# Patient Record
Sex: Female | Born: 1980 | Race: White | Hispanic: No | Marital: Married | Smoking: Never smoker
Health system: Southern US, Community
[De-identification: ages and names within clinical notes are randomized; demographics above are authoritative.]

## PROBLEM LIST (undated history)

## (undated) ENCOUNTER — Inpatient Hospital Stay (HOSPITAL_COMMUNITY): Payer: Self-pay

## (undated) DIAGNOSIS — F419 Anxiety disorder, unspecified: Secondary | ICD-10-CM

## (undated) DIAGNOSIS — B951 Streptococcus, group B, as the cause of diseases classified elsewhere: Secondary | ICD-10-CM

## (undated) DIAGNOSIS — O26899 Other specified pregnancy related conditions, unspecified trimester: Secondary | ICD-10-CM

## (undated) DIAGNOSIS — O2340 Unspecified infection of urinary tract in pregnancy, unspecified trimester: Secondary | ICD-10-CM

## (undated) DIAGNOSIS — E282 Polycystic ovarian syndrome: Secondary | ICD-10-CM

## (undated) DIAGNOSIS — R87612 Low grade squamous intraepithelial lesion on cytologic smear of cervix (LGSIL): Secondary | ICD-10-CM

## (undated) DIAGNOSIS — R109 Unspecified abdominal pain: Secondary | ICD-10-CM

## (undated) DIAGNOSIS — Z227 Latent tuberculosis: Secondary | ICD-10-CM

## (undated) DIAGNOSIS — I499 Cardiac arrhythmia, unspecified: Secondary | ICD-10-CM

## (undated) DIAGNOSIS — F32A Depression, unspecified: Secondary | ICD-10-CM

## (undated) DIAGNOSIS — J45909 Unspecified asthma, uncomplicated: Secondary | ICD-10-CM

## (undated) DIAGNOSIS — O321XX0 Maternal care for breech presentation, not applicable or unspecified: Secondary | ICD-10-CM

## (undated) DIAGNOSIS — R110 Nausea: Secondary | ICD-10-CM

## (undated) DIAGNOSIS — M1990 Unspecified osteoarthritis, unspecified site: Secondary | ICD-10-CM

## (undated) DIAGNOSIS — K589 Irritable bowel syndrome without diarrhea: Secondary | ICD-10-CM

## (undated) DIAGNOSIS — Z8709 Personal history of other diseases of the respiratory system: Secondary | ICD-10-CM

## (undated) DIAGNOSIS — R002 Palpitations: Secondary | ICD-10-CM

## (undated) DIAGNOSIS — R7303 Prediabetes: Secondary | ICD-10-CM

## (undated) DIAGNOSIS — R0789 Other chest pain: Secondary | ICD-10-CM

## (undated) DIAGNOSIS — Z3493 Encounter for supervision of normal pregnancy, unspecified, third trimester: Secondary | ICD-10-CM

## (undated) DIAGNOSIS — R000 Tachycardia, unspecified: Secondary | ICD-10-CM

## (undated) DIAGNOSIS — J189 Pneumonia, unspecified organism: Secondary | ICD-10-CM

## (undated) DIAGNOSIS — Z8619 Personal history of other infectious and parasitic diseases: Secondary | ICD-10-CM

## (undated) DIAGNOSIS — M797 Fibromyalgia: Secondary | ICD-10-CM

## (undated) DIAGNOSIS — F329 Major depressive disorder, single episode, unspecified: Secondary | ICD-10-CM

## (undated) HISTORY — DX: Latent tuberculosis: Z22.7

## (undated) HISTORY — DX: Personal history of other infectious and parasitic diseases: Z86.19

## (undated) HISTORY — DX: Streptococcus, group b, as the cause of diseases classified elsewhere: O23.40

## (undated) HISTORY — PX: COLPOSCOPY W/ BIOPSY / CURETTAGE: SUR283

## (undated) HISTORY — DX: Personal history of other diseases of the respiratory system: Z87.09

## (undated) HISTORY — DX: Unspecified abdominal pain: R10.9

## (undated) HISTORY — DX: Fibromyalgia: M79.7

## (undated) HISTORY — DX: Palpitations: R00.2

## (undated) HISTORY — PX: OTHER SURGICAL HISTORY: SHX169

## (undated) HISTORY — DX: Unspecified osteoarthritis, unspecified site: M19.90

## (undated) HISTORY — DX: Polycystic ovarian syndrome: E28.2

## (undated) HISTORY — DX: Streptococcus, group b, as the cause of diseases classified elsewhere: B95.1

## (undated) HISTORY — DX: Nausea: R11.0

## (undated) HISTORY — PX: ABDOMINAL HYSTERECTOMY: SHX81

## (undated) HISTORY — DX: Tachycardia, unspecified: R00.0

## (undated) HISTORY — DX: Other specified pregnancy related conditions, unspecified trimester: O26.899

## (undated) HISTORY — DX: Other chest pain: R07.89

## (undated) HISTORY — PX: WISDOM TOOTH EXTRACTION: SHX21

## (undated) HISTORY — DX: Irritable bowel syndrome, unspecified: K58.9

## (undated) HISTORY — DX: Low grade squamous intraepithelial lesion on cytologic smear of cervix (LGSIL): R87.612

---
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
## 2001-03-24 HISTORY — PX: MANDIBLE FRACTURE SURGERY: SHX706

## 2001-03-24 HISTORY — PX: NASAL SEPTUM SURGERY: SHX37

## 2002-09-30 ENCOUNTER — Other Ambulatory Visit: Admission: RE | Admit: 2002-09-30 | Discharge: 2002-09-30 | Payer: Self-pay | Admitting: Obstetrics and Gynecology

## 2003-06-19 ENCOUNTER — Emergency Department (HOSPITAL_COMMUNITY): Admission: EM | Admit: 2003-06-19 | Discharge: 2003-06-20 | Payer: Self-pay | Admitting: Emergency Medicine

## 2003-06-26 ENCOUNTER — Ambulatory Visit (HOSPITAL_COMMUNITY): Admission: RE | Admit: 2003-06-26 | Discharge: 2003-06-26 | Payer: Self-pay | Admitting: *Deleted

## 2003-06-26 ENCOUNTER — Encounter: Payer: Self-pay | Admitting: Specialist

## 2003-10-17 ENCOUNTER — Ambulatory Visit (HOSPITAL_COMMUNITY): Admission: RE | Admit: 2003-10-17 | Discharge: 2003-10-17 | Payer: Self-pay | Admitting: Chiropractor

## 2004-01-16 ENCOUNTER — Other Ambulatory Visit: Admission: RE | Admit: 2004-01-16 | Discharge: 2004-01-16 | Payer: Self-pay | Admitting: Obstetrics and Gynecology

## 2004-04-18 ENCOUNTER — Encounter: Admission: RE | Admit: 2004-04-18 | Discharge: 2004-04-18 | Payer: Self-pay | Admitting: Sports Medicine

## 2004-10-09 ENCOUNTER — Emergency Department (HOSPITAL_COMMUNITY): Admission: EM | Admit: 2004-10-09 | Discharge: 2004-10-09 | Payer: Self-pay | Admitting: Emergency Medicine

## 2005-01-30 ENCOUNTER — Other Ambulatory Visit: Admission: RE | Admit: 2005-01-30 | Discharge: 2005-01-30 | Payer: Self-pay | Admitting: Obstetrics and Gynecology

## 2005-02-19 ENCOUNTER — Encounter: Admission: RE | Admit: 2005-02-19 | Discharge: 2005-02-19 | Payer: Self-pay | Admitting: Gastroenterology

## 2005-11-24 DIAGNOSIS — M797 Fibromyalgia: Secondary | ICD-10-CM

## 2005-11-24 HISTORY — DX: Fibromyalgia: M79.7

## 2006-05-11 ENCOUNTER — Other Ambulatory Visit: Admission: RE | Admit: 2006-05-11 | Discharge: 2006-05-11 | Payer: Self-pay | Admitting: Obstetrics & Gynecology

## 2006-08-14 IMAGING — CT CT ABDOMEN W/ CM
1 of 2 series · 15 of 32 positions shown, 20 images · IV contrast (GASTRO & OMNIPAQUE 100)
Comparison: None. 
 CT ABDOMEN WITH CONTRAST:

CLINICAL DATA: Bilateral lower quadrant abdominal pain.
TECHNIQUE: Routine multidetector helical imaging with oral and IV contrast ? 100 cc of Omnipaque 300.

[Series 2: routine abdomen · axial · 0.70mm/px · z∈[-443,-53]mm · 15 of 88 slices shown, 20 images]
[im 5/88  soft-tissue]
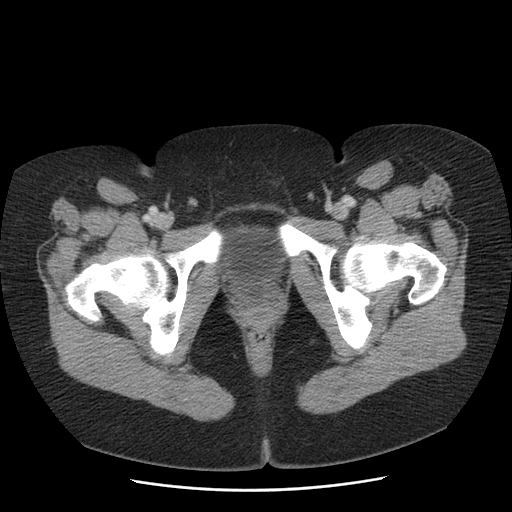
[im 5/88  bone]
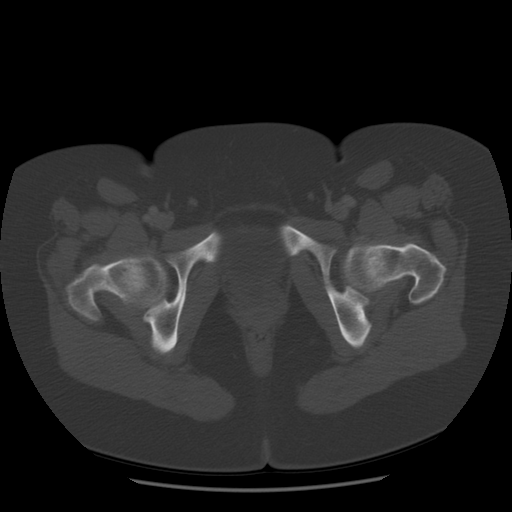
[im 14/88  soft-tissue]
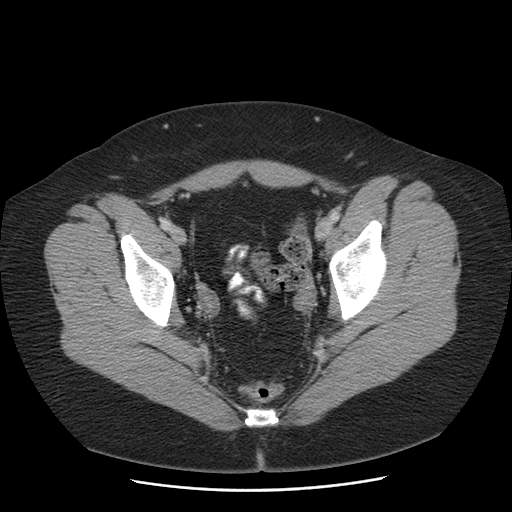
[im 18/88  soft-tissue]
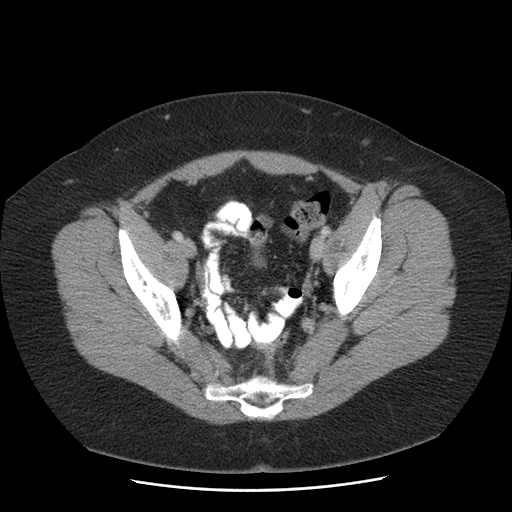
[im 22/88  soft-tissue]
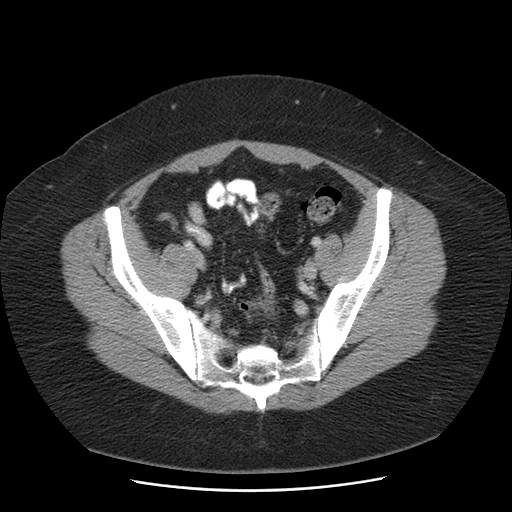
[im 31/88  soft-tissue]
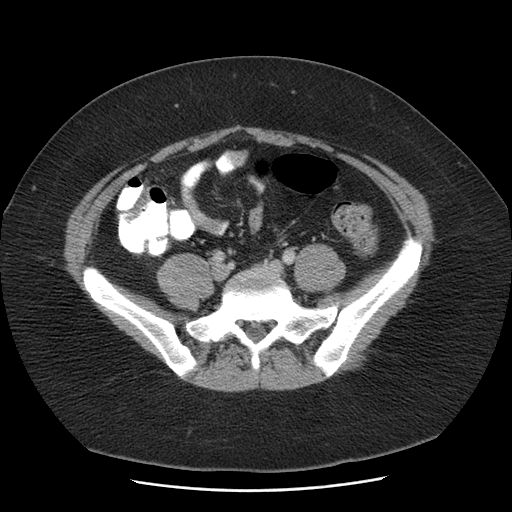
[im 35/88  soft-tissue]
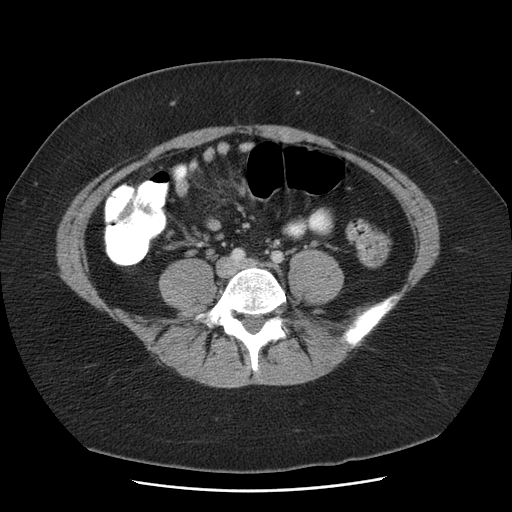
[im 40/88  soft-tissue]
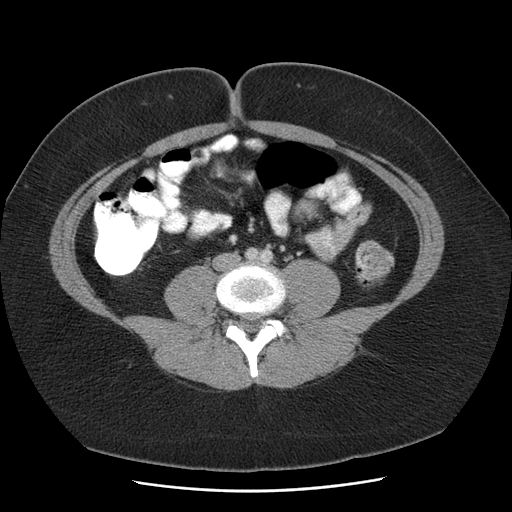
[im 48/88  soft-tissue]
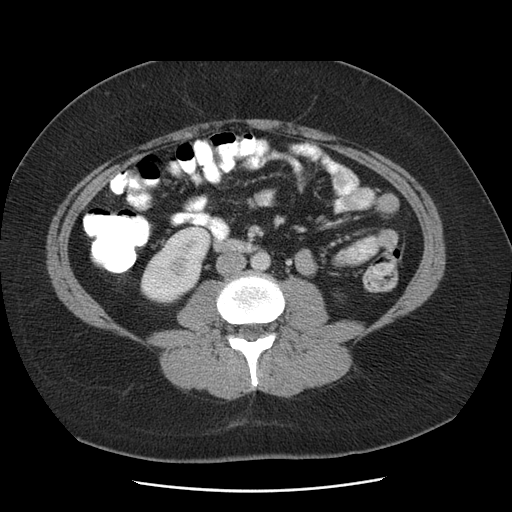
[im 53/88  soft-tissue]
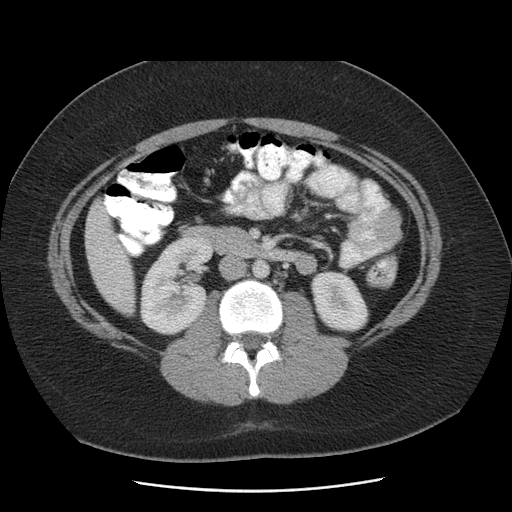
[im 53/88  bone]
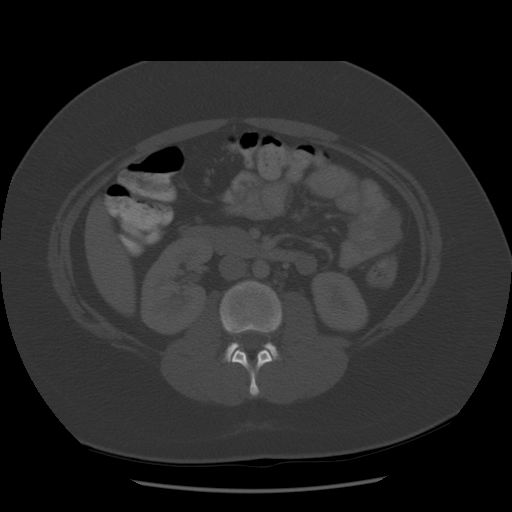
[im 57/88  soft-tissue]
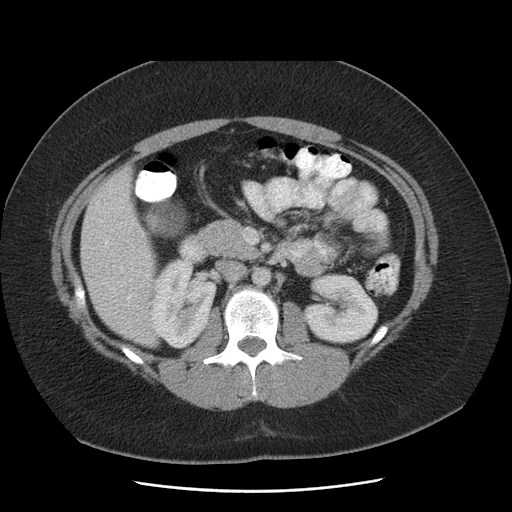
[im 66/88  soft-tissue]
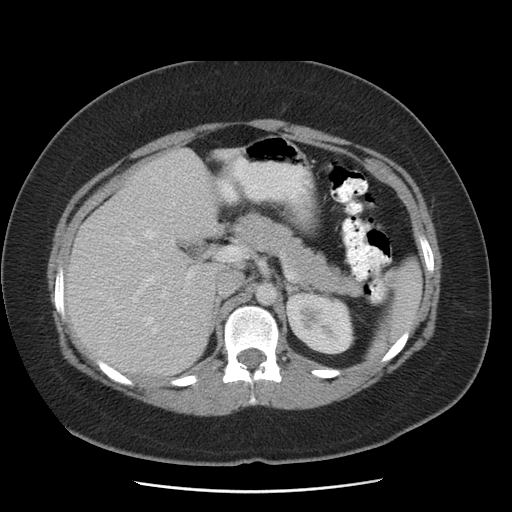
[im 70/88  soft-tissue]
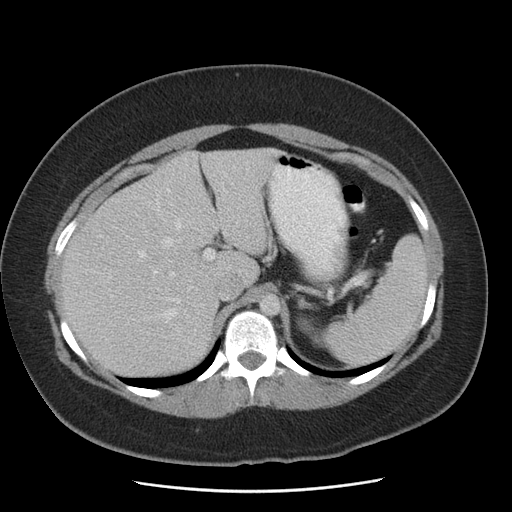
[im 70/88  lung]
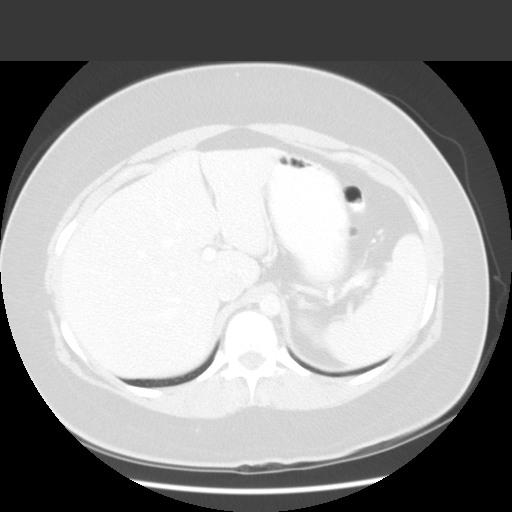
[im 74/88  soft-tissue]
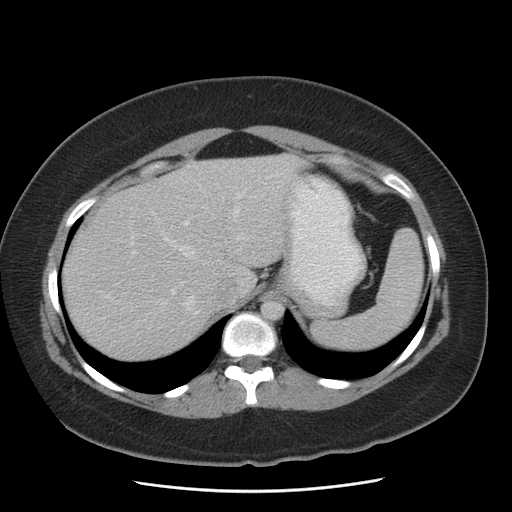
[im 74/88  lung]
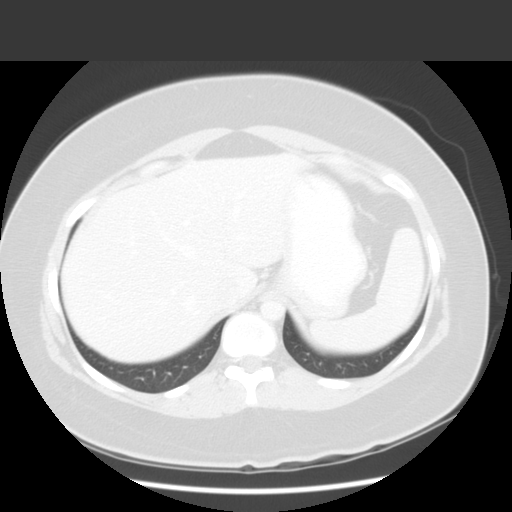
[im 79/88  lung]
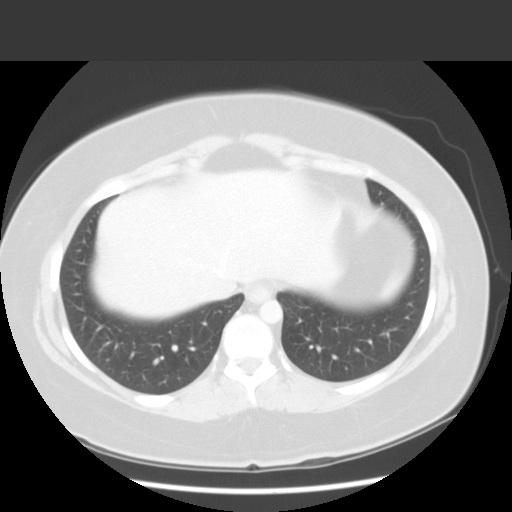
[im 83/88  soft-tissue]
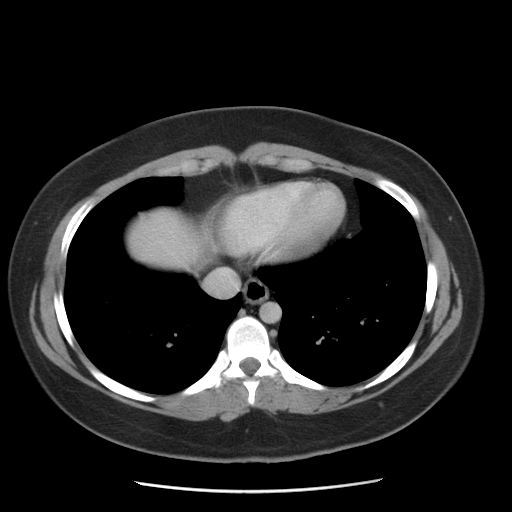
[im 83/88  lung]
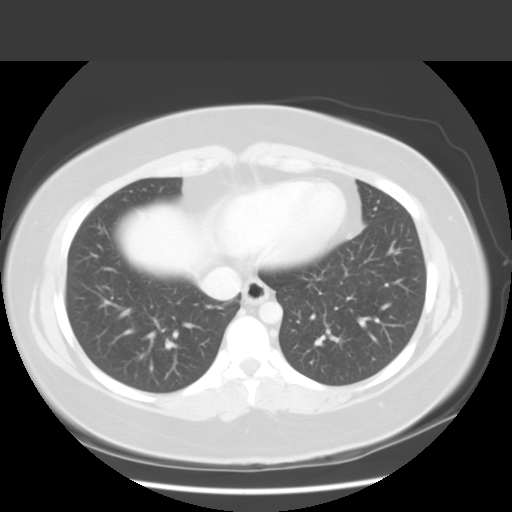

[15 of 32 positions shown; findings below may reference images not displayed]

FINDINGS: Lung bases clear.  Liver, spleen, pancreas, and adrenals normal.  No calcified gallstones or bile duct dilatation.  Early and delayed images of the kidneys normal.  No adenopathy or ascites.
IMPRESSION: Normal. 
 CT PELVIS WITH CONTRAST:
 No focal masses, adenopathy, or fluid collections.  Appendix normal.  Pelvic sidewalls and presacral space normal.  No changes to suggest colitis or ileitis.
IMPRESSION: Normal.

## 2009-11-24 DIAGNOSIS — E282 Polycystic ovarian syndrome: Secondary | ICD-10-CM

## 2009-11-24 DIAGNOSIS — Z227 Latent tuberculosis: Secondary | ICD-10-CM | POA: Insufficient documentation

## 2009-11-24 HISTORY — DX: Polycystic ovarian syndrome: E28.2

## 2009-11-24 HISTORY — DX: Latent tuberculosis: Z22.7

## 2012-05-24 DIAGNOSIS — Z8619 Personal history of other infectious and parasitic diseases: Secondary | ICD-10-CM

## 2012-05-24 HISTORY — DX: Personal history of other infectious and parasitic diseases: Z86.19

## 2013-02-09 ENCOUNTER — Encounter: Payer: Self-pay | Admitting: Obstetrics and Gynecology

## 2013-02-09 ENCOUNTER — Ambulatory Visit (INDEPENDENT_AMBULATORY_CARE_PROVIDER_SITE_OTHER): Payer: BC Managed Care – PPO | Admitting: Obstetrics and Gynecology

## 2013-02-09 VITALS — BP 110/68 | Wt 193.0 lb

## 2013-02-09 DIAGNOSIS — Z23 Encounter for immunization: Secondary | ICD-10-CM

## 2013-02-09 DIAGNOSIS — Z3169 Encounter for other general counseling and advice on procreation: Secondary | ICD-10-CM

## 2013-02-09 NOTE — Patient Instructions (Signed)
Return for annual exam in Nov 2014.

## 2013-02-09 NOTE — Progress Notes (Signed)
32 yo MWF G0P0 here with husband Kim Kennedy for preconceptual counseling.  Pt concerned about pregnancy possibilities because she has been diagnosed with PCOS. Thinking of starting to try in about a year.  No health insurance currently.  Pt just wants info to get ready to try.  Has been online and researched PCOS and Clomid.  Has questions about how long to try before going to Clomid.  Menses currently regular on Yasmin, on it for years.  Diagnosed with PCOS years ago when menses were very erratic before initiation of OC's.  In 2011 came off OC's to cut costs, and had excessive hair growth, irreg menses - went to [facility], had PUS, diagnosed with PCOS, and went back on the pill.  Was ony off oc's for two months before resuming OC's.  Discussed lifestyle modification including diet and exercise in prep for trying to get pregnant.  Also discussed rec: to come off as many meds as possible before pregnancy.  Also rec: check rubella, and give Tdap.    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

## 2013-02-10 LAB — RUBELLA SCREEN: Rubella: 1.15 Index — ABNORMAL HIGH (ref <0.90)

## 2014-02-17 ENCOUNTER — Other Ambulatory Visit: Payer: Self-pay | Admitting: Obstetrics and Gynecology

## 2014-02-20 ENCOUNTER — Encounter (HOSPITAL_COMMUNITY): Payer: Self-pay | Admitting: Pharmacist

## 2014-02-24 ENCOUNTER — Encounter (HOSPITAL_COMMUNITY): Payer: Self-pay

## 2014-02-24 ENCOUNTER — Encounter (HOSPITAL_COMMUNITY)
Admission: RE | Admit: 2014-02-24 | Discharge: 2014-02-24 | Disposition: A | Discharge status: Home / Self Care | Payer: BC Managed Care – PPO | Source: Ambulatory Visit | Attending: Obstetrics and Gynecology | Admitting: Obstetrics and Gynecology

## 2014-02-24 HISTORY — DX: Anxiety disorder, unspecified: F41.9

## 2014-02-24 HISTORY — DX: Unspecified asthma, uncomplicated: J45.909

## 2014-02-24 HISTORY — DX: Cardiac arrhythmia, unspecified: I49.9

## 2014-02-24 LAB — BASIC METABOLIC PANEL
BUN: 19 mg/dL (ref 6–23)
CO2: 26 mEq/L (ref 19–32)
Calcium: 9.5 mg/dL (ref 8.4–10.5)
Chloride: 101 mEq/L (ref 96–112)
Creatinine, Ser: 0.82 mg/dL (ref 0.50–1.10)
GFR calc Af Amer: >90 mL/min (ref >90)
GFR calc non Af Amer: >90 mL/min (ref >90)
Glucose, Bld: 92 mg/dL (ref 70–99)
Potassium: 4.2 mEq/L (ref 3.7–5.3)
Sodium: 141 mEq/L (ref 137–147)

## 2014-02-24 LAB — CBC
HCT: 43.9 % (ref 36.0–46.0)
Hemoglobin: 14.8 g/dL (ref 12.0–15.0)
MCH: 29.5 pg (ref 26.0–34.0)
MCHC: 33.7 g/dL (ref 30.0–36.0)
MCV: 87.5 fL (ref 78.0–100.0)
Platelets: 272 10*3/uL (ref 150–400)
RBC: 5.02 MIL/uL (ref 3.87–5.11)
RDW: 13.6 % (ref 11.5–15.5)
WBC: 8.2 10*3/uL (ref 4.0–10.5)

## 2014-02-24 NOTE — Pre-Procedure Instructions (Deleted)
SDS BB History Log given to Lab at [facility] for history of blood transfusion in [place]. Dr Foster saw patient at PAT appt.  

## 2014-02-24 NOTE — Patient Instructions (Addendum)
   Your procedure is scheduled on: Monday, April 6  [facility] at: 0815 AM Pick up the phone at the desk and dial [phone] and inform us of your arrival.  Please call this number if you have any problems the morning of surgery: [phone]  Remember: Do not eat or drink after midnight: Sunday Take these medicines the morning of surgery with a SIP OF WATER:  None  Do not wear jewelry, make-up, or FINGER nail polish No metal in your hair or on your body. Do not wear lotions, powders, perfumes.  You may wear deodorant.  Do not bring valuables to the hospital. Contacts, dentures or bridgework may not be worn into surgery.  Patients discharged on the day of surgery will not be allowed to drive home.  Home with husband Jean RosenthalJackson.

## 2014-02-26 NOTE — H&P (Signed)
NAMLeory Plowman:  Kim Kennedy, Kim Kennedy               ACCOUNT NO.:  [id number]  MEDICAL RECORD NO.:  [id number]  LOCATION:  [facility]                         FACILITY:  [facility]  PHYSICIAN:  Lenoard Adenichard J. Espen Bethel, M.D.DATE OF BIRTH:  [birth year]  DATE OF ADMISSION:  02/15/2014 DATE OF DISCHARGE:                             HISTORY & PHYSICAL   CHIEF COMPLAINT:  Pelvic pain, dysmenorrhea, dyspareunia.  HISTORY OF PRESENT ILLNESS:  A 33 year old white female, G0, P0 with persistent pelvic pain as noted above, for intervention in the form of diagnostic laparoscopy, possible laparoscopic da Vinci assisted resection of endometriosis.  ALLERGIES:  She has allergies to azithromycin, Augmentin, and latex.  SOCIAL HISTORY:  Noncontributory.  FAMILY HISTORY:  Hypertension, heart disease, diabetes, osteoporosis, hepatitis, and anxiety.  SURGICAL HISTORY:  Tonsillectomy, jaw surgery.  PREGNANCY HISTORY:  Noncontributory.  MEDICATIONS:  Tramadol as needed, Percocet as needed, Glucophage, prenatal vitamins, and metoprolol for unexplained tachycardia with normal workup.  MEDICAL HISTORY:  Also remarkable for PCOS and fibromyalgia.  PHYSICAL EXAMINATION:  GENERAL:  She is a well-developed, well- nourished, white female, in no acute distress. HEENT:  Normal. NECK:  Supple.  Full range of motion. LUNGS:  Clear. HEART:  Regular rate and rhythm. ABDOMEN:  Soft, nontender. PELVIC:  Revealed an anteflexed uterus and no adnexal masses. EXTREMITIES:  Reveal no cords. NEUROLOGIC:  Nonfocal. SKIN:  Intact.  IMPRESSION:  Pelvic pain, dysmenorrhea, and dyspareunia with desire for intervention, signs and symptoms suggestive of endometriosis.  She has got mildly elevated CA-125.  Normal pelvic ultrasound.  PLAN:  Proceed with diagnostic laparoscopy, possible da Vinci assisted resection of endometriosis.  Risks of anesthesia, infection, bleeding, injury to surrounding organs, possible need for repair was discussed, delayed  versus immediate complications to include bowel and bladder injury noted.  Inability to cure all pain as noted has been discussed. Consents were signed.  The patient acknowledges and wishes to proceed.     Lenoard Adenichard J. Jeffifer Rabold, M.D.     RJT/MEDQ  D:  02/26/2014  T:  02/26/2014  Job:  [id number]

## 2014-02-27 ENCOUNTER — Ambulatory Visit (HOSPITAL_COMMUNITY)
Admission: RE | Admit: 2014-02-27 | Discharge: 2014-02-27 | Disposition: A | Discharge status: Home / Self Care | Payer: BC Managed Care – PPO | Source: Ambulatory Visit | Attending: Obstetrics and Gynecology | Admitting: Obstetrics and Gynecology

## 2014-02-27 ENCOUNTER — Encounter (HOSPITAL_COMMUNITY)
Admission: RE | Disposition: A | Discharge status: Home / Self Care | Payer: Self-pay | Source: Ambulatory Visit | Attending: Obstetrics and Gynecology

## 2014-02-27 ENCOUNTER — Ambulatory Visit (HOSPITAL_COMMUNITY): Payer: BC Managed Care – PPO | Admitting: Anesthesiology

## 2014-02-27 ENCOUNTER — Encounter (HOSPITAL_COMMUNITY): Payer: Self-pay | Admitting: Anesthesiology

## 2014-02-27 ENCOUNTER — Encounter (HOSPITAL_COMMUNITY): Payer: BC Managed Care – PPO | Admitting: Anesthesiology

## 2014-02-27 DIAGNOSIS — K219 Gastro-esophageal reflux disease without esophagitis: Secondary | ICD-10-CM | POA: Insufficient documentation

## 2014-02-27 DIAGNOSIS — IMO0002 Reserved for concepts with insufficient information to code with codable children: Secondary | ICD-10-CM | POA: Insufficient documentation

## 2014-02-27 DIAGNOSIS — IMO0001 Reserved for inherently not codable concepts without codable children: Secondary | ICD-10-CM | POA: Insufficient documentation

## 2014-02-27 DIAGNOSIS — E282 Polycystic ovarian syndrome: Secondary | ICD-10-CM | POA: Insufficient documentation

## 2014-02-27 DIAGNOSIS — I4729 Other ventricular tachycardia: Secondary | ICD-10-CM | POA: Insufficient documentation

## 2014-02-27 DIAGNOSIS — I472 Ventricular tachycardia, unspecified: Secondary | ICD-10-CM | POA: Insufficient documentation

## 2014-02-27 DIAGNOSIS — R971 Elevated cancer antigen 125 [CA 125]: Secondary | ICD-10-CM | POA: Insufficient documentation

## 2014-02-27 DIAGNOSIS — I209 Angina pectoris, unspecified: Secondary | ICD-10-CM | POA: Insufficient documentation

## 2014-02-27 DIAGNOSIS — N946 Dysmenorrhea, unspecified: Secondary | ICD-10-CM | POA: Insufficient documentation

## 2014-02-27 DIAGNOSIS — I509 Heart failure, unspecified: Secondary | ICD-10-CM | POA: Insufficient documentation

## 2014-02-27 DIAGNOSIS — F411 Generalized anxiety disorder: Secondary | ICD-10-CM | POA: Insufficient documentation

## 2014-02-27 DIAGNOSIS — N736 Female pelvic peritoneal adhesions (postinfective): Secondary | ICD-10-CM | POA: Insufficient documentation

## 2014-02-27 DIAGNOSIS — N949 Unspecified condition associated with female genital organs and menstrual cycle: Secondary | ICD-10-CM | POA: Insufficient documentation

## 2014-02-27 HISTORY — PX: LAPAROSCOPY: SHX197

## 2014-02-27 HISTORY — PX: LAPAROSCOPIC LYSIS OF ADHESIONS: SHX5905

## 2014-02-27 LAB — HCG, SERUM, QUALITATIVE: Preg, Serum: NEGATIVE

## 2014-02-27 SURGERY — LAPAROSCOPY, DIAGNOSTIC
Anesthesia: General | Site: Abdomen

## 2014-02-27 MED ORDER — HEPARIN SODIUM (PORCINE) 5000 UNIT/ML IJ SOLN
INTRAMUSCULAR | Status: AC
  Filled 2014-02-27: qty 1

## 2014-02-27 MED ORDER — NEOSTIGMINE METHYLSULFATE 1 MG/ML IJ SOLN
INTRAMUSCULAR | Status: DC | PRN
  Administered 2014-02-27: 3 mg via INTRAVENOUS

## 2014-02-27 MED ORDER — SUFENTANIL CITRATE 50 MCG/ML IV SOLN
INTRAVENOUS | Status: DC | PRN
  Administered 2014-02-27 (×2): 20 ug via INTRAVENOUS
  Administered 2014-02-27: 10 ug via INTRAVENOUS

## 2014-02-27 MED ORDER — OXYCODONE HCL 5 MG PO TABS: 5.0000 mg | ORAL_TABLET | Freq: Once | ORAL | Status: DC | PRN

## 2014-02-27 MED ORDER — SUFENTANIL CITRATE 50 MCG/ML IV SOLN
INTRAVENOUS | Status: AC
  Filled 2014-02-27: qty 1

## 2014-02-27 MED ORDER — GLYCOPYRROLATE 0.2 MG/ML IJ SOLN
INTRAMUSCULAR | Status: DC | PRN
  Administered 2014-02-27: 0.6 mg via INTRAVENOUS

## 2014-02-27 MED ORDER — CEFAZOLIN SODIUM-DEXTROSE 2-3 GM-% IV SOLR
2.0000 g | INTRAVENOUS | Status: AC
  Administered 2014-02-27: 2 g via INTRAVENOUS

## 2014-02-27 MED ORDER — DEXAMETHASONE SODIUM PHOSPHATE 10 MG/ML IJ SOLN
INTRAMUSCULAR | Status: DC | PRN
  Administered 2014-02-27: 10 mg via INTRAVENOUS

## 2014-02-27 MED ORDER — LACTATED RINGERS IV SOLN
INTRAVENOUS | Status: DC
Start: 2014-02-27 — End: 2014-02-27
  Administered 2014-02-27 (×2): via INTRAVENOUS

## 2014-02-27 MED ORDER — SODIUM CHLORIDE 0.9 % IJ SOLN
INTRAMUSCULAR | Status: AC
  Filled 2014-02-27: qty 10

## 2014-02-27 MED ORDER — NEOSTIGMINE METHYLSULFATE 1 MG/ML IJ SOLN
INTRAMUSCULAR | Status: AC
  Filled 2014-02-27: qty 1

## 2014-02-27 MED ORDER — OXYCODONE-ACETAMINOPHEN 5-325 MG PO TABS: 1.0000 | ORAL_TABLET | ORAL | Status: DC | PRN

## 2014-02-27 MED ORDER — ONDANSETRON HCL 4 MG/2ML IJ SOLN
INTRAMUSCULAR | Status: AC
  Filled 2014-02-27: qty 2

## 2014-02-27 MED ORDER — FENTANYL CITRATE 0.05 MG/ML IJ SOLN
25.0000 ug | INTRAMUSCULAR | Status: DC | PRN
  Administered 2014-02-27 (×2): 50 ug via INTRAVENOUS

## 2014-02-27 MED ORDER — LIDOCAINE HCL (CARDIAC) 20 MG/ML IV SOLN
INTRAVENOUS | Status: AC
  Filled 2014-02-27: qty 5

## 2014-02-27 MED ORDER — PROPOFOL 10 MG/ML IV EMUL
INTRAVENOUS | Status: AC
  Filled 2014-02-27: qty 20

## 2014-02-27 MED ORDER — GLYCOPYRROLATE 0.2 MG/ML IJ SOLN
INTRAMUSCULAR | Status: AC
  Filled 2014-02-27: qty 4

## 2014-02-27 MED ORDER — ONDANSETRON HCL 4 MG/2ML IJ SOLN
INTRAMUSCULAR | Status: DC | PRN
  Administered 2014-02-27: 4 mg via INTRAVENOUS

## 2014-02-27 MED ORDER — PROPOFOL 10 MG/ML IV BOLUS
INTRAVENOUS | Status: DC | PRN
  Administered 2014-02-27: 150 mg via INTRAVENOUS
  Administered 2014-02-27: 30 mg via INTRAVENOUS
  Administered 2014-02-27: 20 mg via INTRAVENOUS

## 2014-02-27 MED ORDER — KETOROLAC TROMETHAMINE 30 MG/ML IJ SOLN: 15.0000 mg | Freq: Once | INTRAMUSCULAR | Status: DC | PRN

## 2014-02-27 MED ORDER — ACETAMINOPHEN 325 MG PO TABS: 325.0000 mg | ORAL_TABLET | ORAL | Status: DC | PRN

## 2014-02-27 MED ORDER — BUPIVACAINE HCL (PF) 0.25 % IJ SOLN
INTRAMUSCULAR | Status: DC | PRN
  Administered 2014-02-27: 20 mL

## 2014-02-27 MED ORDER — DEXAMETHASONE SODIUM PHOSPHATE 10 MG/ML IJ SOLN
INTRAMUSCULAR | Status: AC
  Filled 2014-02-27: qty 1

## 2014-02-27 MED ORDER — MIDAZOLAM HCL 5 MG/5ML IJ SOLN
INTRAMUSCULAR | Status: DC | PRN
  Administered 2014-02-27: 2 mg via INTRAVENOUS

## 2014-02-27 MED ORDER — BUPIVACAINE HCL (PF) 0.25 % IJ SOLN
INTRAMUSCULAR | Status: AC
  Filled 2014-02-27: qty 30

## 2014-02-27 MED ORDER — LACTATED RINGERS IR SOLN
Status: DC | PRN
  Administered 2014-02-27: 3000 mL

## 2014-02-27 MED ORDER — ROPIVACAINE HCL 5 MG/ML IJ SOLN
INTRAMUSCULAR | Status: AC
  Filled 2014-02-27: qty 60

## 2014-02-27 MED ORDER — PROMETHAZINE HCL 25 MG/ML IJ SOLN: 6.2500 mg | INTRAMUSCULAR | Status: DC | PRN

## 2014-02-27 MED ORDER — ROCURONIUM BROMIDE 100 MG/10ML IV SOLN
INTRAVENOUS | Status: DC | PRN
  Administered 2014-02-27: 50 mg via INTRAVENOUS

## 2014-02-27 MED ORDER — ACETAMINOPHEN 160 MG/5ML PO SOLN: 325.0000 mg | ORAL | Status: DC | PRN

## 2014-02-27 MED ORDER — OXYCODONE HCL 5 MG/5ML PO SOLN: 5.0000 mg | Freq: Once | ORAL | Status: DC | PRN

## 2014-02-27 MED ORDER — LIDOCAINE HCL (CARDIAC) 20 MG/ML IV SOLN
INTRAVENOUS | Status: DC | PRN
  Administered 2014-02-27: 50 mg via INTRAVENOUS

## 2014-02-27 MED ORDER — MIDAZOLAM HCL 2 MG/2ML IJ SOLN
INTRAMUSCULAR | Status: AC
  Filled 2014-02-27: qty 2

## 2014-02-27 MED ORDER — KETOROLAC TROMETHAMINE 30 MG/ML IJ SOLN
INTRAMUSCULAR | Status: DC | PRN
  Administered 2014-02-27: 30 mg via INTRAVENOUS

## 2014-02-27 MED ORDER — SODIUM CHLORIDE 0.9 % IJ SOLN
INTRAMUSCULAR | Status: AC
  Filled 2014-02-27: qty 50

## 2014-02-27 MED ORDER — CEFAZOLIN SODIUM-DEXTROSE 2-3 GM-% IV SOLR
INTRAVENOUS | Status: AC
  Filled 2014-02-27: qty 50

## 2014-02-27 MED ORDER — FENTANYL CITRATE 0.05 MG/ML IJ SOLN
INTRAMUSCULAR | Status: AC
  Administered 2014-02-27: 50 ug via INTRAVENOUS
  Filled 2014-02-27: qty 2

## 2014-02-27 SURGICAL SUPPLY — 82 items
ADH SKN CLS APL DERMABOND .7 (GAUZE/BANDAGES/DRESSINGS) ×2
ADH SKN CLS LQ APL DERMABOND (GAUZE/BANDAGES/DRESSINGS) ×2
BAG URINE DRAINAGE (UROLOGICAL SUPPLIES) ×3 IMPLANT
BARRIER ADHS 3X4 INTERCEED (GAUZE/BANDAGES/DRESSINGS) IMPLANT
BRR ADH 4X3 ABS CNTRL BYND (GAUZE/BANDAGES/DRESSINGS)
CABLE HIGH FREQUENCY MONO STRZ (ELECTRODE) ×2 IMPLANT
CATH FOLEY 3WAY  5CC 16FR (CATHETERS) ×1
CATH FOLEY 3WAY 5CC 16FR (CATHETERS) ×2 IMPLANT
CATH FOLEY LF 3WAY 5CC16FR (CATHETERS) ×2 IMPLANT
CATH ROBINSON RED A/P 16FR (CATHETERS) IMPLANT
CHLORAPREP W/TINT 26ML (MISCELLANEOUS) ×3 IMPLANT
CLOTH BEACON ORANGE TIMEOUT ST (SAFETY) ×3 IMPLANT
CONT PATH 16OZ SNAP LID 3702 (MISCELLANEOUS) ×3 IMPLANT
COVER MAYO STAND STRL (DRAPES) ×3 IMPLANT
COVER TABLE BACK 60X90 (DRAPES) ×6 IMPLANT
COVER TIP SHEARS 8 DVNC (MISCELLANEOUS) ×2 IMPLANT
COVER TIP SHEARS 8MM DA VINCI (MISCELLANEOUS) ×1
DECANTER SPIKE VIAL GLASS SM (MISCELLANEOUS) ×3 IMPLANT
DERMABOND ADHESIVE PROPEN (GAUZE/BANDAGES/DRESSINGS) ×1
DERMABOND ADVANCED (GAUZE/BANDAGES/DRESSINGS) ×1
DERMABOND ADVANCED .7 DNX12 (GAUZE/BANDAGES/DRESSINGS) ×2 IMPLANT
DERMABOND ADVANCED .7 DNX6 (GAUZE/BANDAGES/DRESSINGS) ×1 IMPLANT
DRAPE HUG U DISPOSABLE (DRAPE) ×3 IMPLANT
DRAPE LG THREE QUARTER DISP (DRAPES) ×6 IMPLANT
DRAPE WARM FLUID 44X44 (DRAPE) ×3 IMPLANT
ELECT REM PT RETURN 9FT ADLT (ELECTROSURGICAL) ×3
ELECTRODE REM PT RTRN 9FT ADLT (ELECTROSURGICAL) ×2 IMPLANT
EVACUATOR SMOKE 8.L (FILTER) ×3 IMPLANT
FORCEPS CUTTING 33CM 5MM (CUTTING FORCEPS) IMPLANT
FORCEPS CUTTING 45CM 5MM (CUTTING FORCEPS) IMPLANT
GAS CARTRIDGE (MEDICAL GASES) IMPLANT
GAUZE VASELINE 3X9 (GAUZE/BANDAGES/DRESSINGS) IMPLANT
GLOVE BIO SURGEON STRL SZ7.5 (GLOVE) ×6 IMPLANT
GOWN STRL REUS W/TWL LRG LVL3 (GOWN DISPOSABLE) ×21 IMPLANT
GYRUS RUMI II 2.5CM BLUE (DISPOSABLE)
GYRUS RUMI II 3.5CM BLUE (DISPOSABLE)
GYRUS RUMI II 4.0CM BLUE (DISPOSABLE)
KIT ACCESSORY DA VINCI DISP (KITS) ×1
KIT ACCESSORY DVNC DISP (KITS) ×2 IMPLANT
LEGGING LITHOTOMY PAIR STRL (DRAPES) ×3 IMPLANT
NEEDLE INSUFFLATION 120MM (ENDOMECHANICALS) ×3 IMPLANT
NEEDLE INSUFFLATION 150MM (ENDOMECHANICALS) ×3 IMPLANT
PACK LAPAROSCOPY BASIN (CUSTOM PROCEDURE TRAY) ×3 IMPLANT
PACK LAVH (CUSTOM PROCEDURE TRAY) ×3 IMPLANT
PAD PREP 24X48 CUFFED NSTRL (MISCELLANEOUS) ×6 IMPLANT
PLUG CATH AND CAP STER (CATHETERS) ×3 IMPLANT
PROTECTOR NERVE ULNAR (MISCELLANEOUS) ×6 IMPLANT
RUMI II 3.0CM BLUE KOH-EFFICIE (DISPOSABLE) IMPLANT
RUMI II GYRUS 2.5CM BLUE (DISPOSABLE) IMPLANT
RUMI II GYRUS 3.5CM BLUE (DISPOSABLE) IMPLANT
RUMI II GYRUS 4.0CM BLUE (DISPOSABLE) IMPLANT
SET CYSTO W/LG BORE CLAMP LF (SET/KITS/TRAYS/PACK) ×2 IMPLANT
SET IRRIG TUBING LAPAROSCOPIC (IRRIGATION / IRRIGATOR) ×3 IMPLANT
SOLUTION ELECTROLUBE (MISCELLANEOUS) ×3 IMPLANT
SUT VIC AB 0 CT1 27 (SUTURE) ×6
SUT VIC AB 0 CT1 27XBRD ANBCTR (SUTURE) ×4 IMPLANT
SUT VIC AB 0 CT1 27XBRD ANTBC (SUTURE) IMPLANT
SUT VICRYL 0 UR6 27IN ABS (SUTURE) ×3 IMPLANT
SUT VICRYL 4-0 PS2 18IN ABS (SUTURE) ×6 IMPLANT
SUT VICRYL RAPIDE 4/0 PS 2 (SUTURE) ×6 IMPLANT
SYR 50ML LL SCALE MARK (SYRINGE) ×3 IMPLANT
SYRINGE 10CC LL (SYRINGE) ×3 IMPLANT
SYSTEM CONVERTIBLE TROCAR (TROCAR) IMPLANT
TAPE UMBILICAL 1/8 X36 TWILL (MISCELLANEOUS) IMPLANT
TIP UTERINE 5.1X6CM LAV DISP (MISCELLANEOUS) IMPLANT
TIP UTERINE 6.7X10CM GRN DISP (MISCELLANEOUS) IMPLANT
TIP UTERINE 6.7X6CM WHT DISP (MISCELLANEOUS) IMPLANT
TIP UTERINE 6.7X8CM BLUE DISP (MISCELLANEOUS) IMPLANT
TOWEL OR 17X24 6PK STRL BLUE (TOWEL DISPOSABLE) ×9 IMPLANT
TRAY FOLEY CATH 14FR (SET/KITS/TRAYS/PACK) ×3 IMPLANT
TROCAR BLADELESS OPT 12M 100M (ENDOMECHANICALS) ×2 IMPLANT
TROCAR DISP BLADELESS 8 DVNC (TROCAR) ×2 IMPLANT
TROCAR DISP BLADELESS 8MM (TROCAR) ×1
TROCAR OPTI TIP 5M 100M (ENDOMECHANICALS) ×3 IMPLANT
TROCAR XCEL 12X100 BLDLESS (ENDOMECHANICALS) IMPLANT
TROCAR XCEL DIL TIP R 11M (ENDOMECHANICALS) ×3 IMPLANT
TROCAR XCEL NON-BLD 5MMX100MML (ENDOMECHANICALS) ×3 IMPLANT
TROCAR XCEL OPT SLVE 5M 100M (ENDOMECHANICALS) ×2 IMPLANT
TROCAR Z-THREAD 12X150 (TROCAR) ×3 IMPLANT
TUBING FILTER THERMOFLATOR (ELECTROSURGICAL) ×3 IMPLANT
WARMER LAPAROSCOPE (MISCELLANEOUS) ×3 IMPLANT
WATER STERILE IRR 1000ML POUR (IV SOLUTION) ×9 IMPLANT

## 2014-02-27 NOTE — Progress Notes (Signed)
Patient ID: Kim Kennedy, female   DOB: [birth year], 33 y.o.   MRN: [id number] Patient seen and examined. Consent witnessed and signed. No changes noted. Update completed.

## 2014-02-27 NOTE — Discharge Instructions (Signed)
DISCHARGE INSTRUCTIONS: Laparoscopy  MAY TAKE IBUPROFEN (MOTRIN, ADVIL) OR ALEVE AFTER 6:00 PM!!!!  The following instructions have been prepared to help you care for yourself upon your return home today.  Wound care:  Do not get the incision wet for the first 24 hours. The incision should be kept clean and dry.  The Band-Aids or dressings may be removed the day after surgery.  Should the incision become sore, red, and swollen after the first week, check with your doctor.  Personal hygiene:  Shower the day after your procedure.  Activity and limitations:  Do NOT drive or operate any equipment today.  Do NOT lift anything more than 15 pounds for 2-3 weeks after surgery.  Do NOT rest in bed all day.  Walking is encouraged. Walk each day, starting slowly with 5-minute walks 3 or 4 times a day. Slowly increase the length of your walks.  Walk up and down stairs slowly.  Do NOT do strenuous activities, such as golfing, playing tennis, bowling, running, biking, weight lifting, gardening, mowing, or vacuuming for 2-4 weeks. Ask your doctor when it is okay to start.  Diet: Eat a light meal as desired this evening. You may resume your usual diet tomorrow.  Return to work: This is dependent on the type of work you do. For the most part you can return to a desk job within a week of surgery. If you are more active at work, please discuss this with your doctor.  What to expect after your surgery: You may have a slight burning sensation when you urinate on the first day. You may have a very small amount of blood in the urine. Expect to have a small amount of vaginal discharge/light bleeding for 1-2 weeks. It is not unusual to have abdominal soreness and bruising for up to 2 weeks. You may be tired and need more rest for about 1 week. You may experience shoulder pain for 24-72 hours. Lying flat in bed may relieve it.  Call your doctor for any of the following:  Develop a fever of 100.4 or  greater  Inability to urinate 6 hours after discharge from hospital  Severe pain not relieved by pain medications  Persistent of heavy bleeding at incision site  Redness or swelling around incision site after a week  Increasing nausea or vomiting  Patient Signature________________________________________ Nurse Signature_________________________________________

## 2014-02-27 NOTE — Anesthesia Postprocedure Evaluation (Signed)
  Anesthesia Post-op Note  Patient: Kim Kennedy  Procedure(s) Performed: Procedure(s): LAPAROSCOPY DIAGNOSTIC (N/A) LAPAROSCOPIC LYSIS OF ADHESIONS (N/A)  Patient Location: PACU  Anesthesia Type:General  Level of Consciousness: awake, alert  and oriented  Airway and Oxygen Therapy: Patient Spontanous Breathing  Post-op Pain: mild  Post-op Assessment: Post-op Vital signs reviewed, Patient's Cardiovascular Status Stable, Respiratory Function Stable, Patent Airway, No signs of Nausea or vomiting and Pain level controlled  Post-op Vital Signs: Reviewed and stable  Complications: No apparent anesthesia complications

## 2014-02-27 NOTE — Anesthesia Preprocedure Evaluation (Signed)
Anesthesia Evaluation  Patient identified by MRN, date of birth, ID band Patient awake    Reviewed: Allergy & Precautions, H&P , NPO status , Patient's Chart, lab work & pertinent test results, reviewed documented beta blocker date and time   History of Anesthesia Complications Negative for: history of anesthetic complications  Airway Mallampati: II TM Distance: >3 FB Neck ROM: Full    Dental  (+) Teeth Intact   Pulmonary neg shortness of breath, asthma , neg sleep apnea, neg recent URI,  breath sounds clear to auscultation        Cardiovascular - angina- CHF and - DOE + dysrhythmias Supra Ventricular Tachycardia Rhythm:Regular Rate:Normal     Neuro/Psych PSYCHIATRIC DISORDERS Anxiety    GI/Hepatic GERD-  Controlled,  Endo/Other  PCOS on metformin  Renal/GU      Musculoskeletal  (+) Fibromyalgia -  Abdominal   Peds  Hematology negative hematology ROS (+)   Anesthesia Other Findings   Reproductive/Obstetrics                           Anesthesia Physical Anesthesia Plan  ASA: II  Anesthesia Plan: General   Post-op Pain Management:    Induction: Intravenous  Airway Management Planned: Oral ETT and Video Laryngoscope Planned  Additional Equipment: None  Intra-op Plan:   Post-operative Plan: Extubation in OR  Informed Consent: I have reviewed the patients History and Physical, chart, labs and discussed the procedure including the risks, benefits and alternatives for the proposed anesthesia with the patient or authorized representative who has indicated his/her understanding and acceptance.   Dental advisory given  Plan Discussed with: CRNA and Surgeon  Anesthesia Plan Comments:         Anesthesia Quick Evaluation

## 2014-02-27 NOTE — Transfer of Care (Signed)
Immediate Anesthesia Transfer of Care Note  Patient: Kim Kennedy  Procedure(s) Performed: Procedure(s): LAPAROSCOPY DIAGNOSTIC (N/A) LAPAROSCOPIC LYSIS OF ADHESIONS (N/A)  Patient Location: PACU  Anesthesia Type:General  Level of Consciousness: awake  Airway & Oxygen Therapy: Patient Spontanous Breathing and Patient connected to nasal cannula oxygen  Post-op Assessment: Report given to PACU RN and Post -op Vital signs reviewed and stable  Post vital signs: stable  Complications: No apparent anesthesia complications

## 2014-02-27 NOTE — Op Note (Signed)
02/27/2014  11:36 AM  PATIENT:  Kim Kennedy  33 y.o. female  PRE-OPERATIVE DIAGNOSIS:  Pelvic Pain, Dysmenorrhea, Dyspareunia  POST-OPERATIVE DIAGNOSIS:  pelvic pain,dysmenorrhea, Dyspareunia Pelvic adhesions  PROCEDURE:  Procedure(s): LAPAROSCOPY DIAGNOSTIC LAPAROSCOPIC LYSIS OF ADHESIONS  SURGEON:  Surgeon(s): Lenoard Adenichard J Marvina Danner, MD  ASSISTANTS: Arita Missawson, CNM   ANESTHESIA:   local and general  ESTIMATED BLOOD LOSS; minimal  DRAINS: Urinary Catheter (Foley)   LOCAL MEDICATIONS USED:  MARCAINE    and Amount: 20 ml  SPECIMEN:  No Specimen  DISPOSITION OF SPECIMEN:  N/A  COUNTS:  YES  DICTATION #: [id number]  PLAN OF CARE: dc home  PATIENT DISPOSITION:  PACU - hemodynamically stable.

## 2014-02-28 ENCOUNTER — Encounter (HOSPITAL_COMMUNITY): Payer: Self-pay | Admitting: Obstetrics and Gynecology

## 2014-02-28 NOTE — Op Note (Signed)
NAMLeory Plowman:  Calabretta, May               ACCOUNT NO.:  [id number]  MEDICAL RECORD NO.:  [id number]  LOCATION:  [facility]                          [facility]ACILITY:  [facility]  PHYSICIAN:  Lenoard Adenichard J. Tarica Harl, M.D.DATE OF BIRTH:  [birth year]  DATE OF PROCEDURE:  02/27/2014 DATE OF DISCHARGE:  02/27/2014                              OPERATIVE REPORT   PREOPERATIVE DIAGNOSIS:  Severe unexplained pelvic pain, dysmenorrhea, and dyspareunia.  POSTOPERATIVE DIAGNOSIS:  Severe unexplained pelvic pain, dysmenorrhea, dyspareunia, and pelvic adhesions.  PROCEDURE:  Diagnostic laparoscopy with lysis of pelvic adhesions.  SURGEON:  Lenoard Adenichard J. Vega Stare, M.D.  ASSISTANBruce Donath:  Dolson, CNM  ESTIMATED BLOOD LOSS:  Less than 50 mL.  COMPLICATIONS:  None.  DRAINS:  Foley counts correct.  SPECIMEN:  None.  DISPOSITION:  The patient to recovery in good condition.  DESCRIPTION OF PROCEDURE:  After being apprised of the risks of anesthesia, infection, bleeding, injury to surrounding organs, possible need for repair, delayed versus immediate complications to include bowel and bladder injury with possible need for repair, the patient was brought to the operating room where she was administered general anesthetic without complications.  Prepped and draped in usual sterile fashion.  Foley catheter placed.  Exam under anesthesia reveals anteflexed uterus with no adnexal masses.  A RUMI retractor was placed per vagina without difficulty and attention was turned to the abdominal portion of procedure after feet have been placed in the Yellofin stirrups as noted.  Infraumbilical incision was made with a scalpel. Veress needle placed with opening pressure of 2 noted 4 L of CO2 insufflated without difficulty.  Trocar was placed atraumatically. Pictures taken.  Normal liver and gallbladder bed noted.  Normal diaphragm, normal appendix, and normal sized uterus.  Normal anterior and posterior cul-de-sac.  Normal right tube and left tube.   Bilateral normal ovaries.  Normal ovarian fossa and normal posterior cul-de-sac. There are some adnexal adhesions of the sigmoid mesentery to the left adnexa.  A 5 mm port was made.  Sharp lysis of adhesions was performed and freeing up the bowel adhesions on the left adnexa exposing and otherwise normal.  The left adnexa as previously noted.  At this time, irrigation was accomplished.  Good hemostasis was achieved.  The procedure was then terminated.  CO2 was released and trocars removed under direct visualization.  A dilute Marcaine solution was placed 10 mL total.  Both incisions were closed using 0 Vicryl, 4-0 Vicryl, and Dermabond.  The patient tolerated the procedure well and was transferred to recovery in good condition.     Lenoard Adenichard J. Anglia Blakley, M.D.     RJT/MEDQ  D:  02/27/2014  T:  02/28/2014  Job:  [id number]

## 2014-05-25 LAB — OB RESULTS CONSOLE ABO/RH: RH Type: POSITIVE

## 2014-05-25 LAB — OB RESULTS CONSOLE HEPATITIS B SURFACE ANTIGEN: Hepatitis B Surface Ag: NEGATIVE

## 2014-05-25 LAB — OB RESULTS CONSOLE RUBELLA ANTIBODY, IGM: Rubella: IMMUNE

## 2014-05-25 LAB — OB RESULTS CONSOLE RPR: RPR: NONREACTIVE

## 2014-05-25 LAB — OB RESULTS CONSOLE ANTIBODY SCREEN: Antibody Screen: NEGATIVE

## 2014-05-25 LAB — OB RESULTS CONSOLE HIV ANTIBODY (ROUTINE TESTING): HIV: NONREACTIVE

## 2014-06-01 LAB — OB RESULTS CONSOLE GC/CHLAMYDIA
Chlamydia: NEGATIVE
Gonorrhea: NEGATIVE

## 2014-08-01 ENCOUNTER — Encounter: Payer: Self-pay | Admitting: *Deleted

## 2014-08-03 ENCOUNTER — Encounter (HOSPITAL_COMMUNITY): Payer: Self-pay | Admitting: *Deleted

## 2014-08-03 ENCOUNTER — Inpatient Hospital Stay (HOSPITAL_COMMUNITY)
Admission: AD | Admit: 2014-08-03 | Discharge: 2014-08-03 | Disposition: A | Discharge status: Home / Self Care | Payer: BC Managed Care – PPO | Source: Ambulatory Visit | Attending: Obstetrics and Gynecology | Admitting: Obstetrics and Gynecology

## 2014-08-03 ENCOUNTER — Ambulatory Visit: Payer: BC Managed Care – PPO | Admitting: Internal Medicine

## 2014-08-03 DIAGNOSIS — O26899 Other specified pregnancy related conditions, unspecified trimester: Secondary | ICD-10-CM

## 2014-08-03 DIAGNOSIS — O9989 Other specified diseases and conditions complicating pregnancy, childbirth and the puerperium: Principal | ICD-10-CM

## 2014-08-03 DIAGNOSIS — O99891 Other specified diseases and conditions complicating pregnancy: Secondary | ICD-10-CM | POA: Diagnosis not present

## 2014-08-03 DIAGNOSIS — R109 Unspecified abdominal pain: Secondary | ICD-10-CM | POA: Diagnosis present

## 2014-08-03 DIAGNOSIS — M545 Low back pain, unspecified: Secondary | ICD-10-CM | POA: Insufficient documentation

## 2014-08-03 DIAGNOSIS — O26892 Other specified pregnancy related conditions, second trimester: Secondary | ICD-10-CM

## 2014-08-03 LAB — URINALYSIS, ROUTINE W REFLEX MICROSCOPIC
Bilirubin Urine: NEGATIVE
Glucose, UA: NEGATIVE mg/dL
Hgb urine dipstick: NEGATIVE
Ketones, ur: NEGATIVE mg/dL
Leukocytes, UA: NEGATIVE
Nitrite: NEGATIVE
Protein, ur: NEGATIVE mg/dL
Specific Gravity, Urine: <1.005 — ABNORMAL LOW (ref 1.005–1.030)
Urobilinogen, UA: 0.2 mg/dL (ref 0.0–1.0)
pH: 6 (ref 5.0–8.0)

## 2014-08-03 MED ORDER — IBUPROFEN 600 MG PO TABS: 600.0000 mg | ORAL_TABLET | Freq: Four times a day (QID) | ORAL | Status: DC

## 2014-08-03 NOTE — MAU Provider Note (Addendum)
History     CSN: [id number]  Arrival date and time: 08/03/14 8119 Provider on unit: 0550 Provider at bedside: 0600     Chief Complaint  Patient presents with  . Back Pain  . Abdominal Pain   HPI  Ms. Kim Kennedy is a 33 yo G1P0 @ 19.[redacted] wks gestation presenting with complaints of extreme lower abdominal and lower back pain.  She states the lower back pain has gone on all day.  She was seen by her chiropractor for an adjustment this afternoon and felt better.  She tried soaking in warm water, maintained good hydration all day, and took Tylenol 500 mg @ 0330.  The pain is from just below bellybutton down to upper thigh area and radiates around to lower back.  The pain increased in intensity @ 0330.  She has not felt fetal quickening yet.  Denies UTI sx's, VB or LOF.  She reports vaginal discharge that is white, but states that it is not unusual and she has no vaginal complaints.  (+) GBS bacteriuria.  Primary OB Provider is Dr. Billy Coast.  Past Medical History  Diagnosis Date  . Polycystic ovarian syndrome 2011  . History of HPV infection   . Dysrhythmia     Hx tachycardia - tx with lopressor  . Inactive TB 2011    chest xray neg  . Asthma     prn -seasonal use of inhaler only  . Bipolar disorder     hx - no meds  . Anxiety     hx - no meds  . GERD (gastroesophageal reflux disease)     occasional - diet control - no meds  . Fibromyalgia 2007    no meds    Past Surgical History  Procedure Laterality Date  . Tonsillectomy and adenoidectomy    . Mandible fracture surgery  03/2001    x 7 jaw surgeries  . Wisdom tooth extraction    . Nasal septum surgery  03/2001  . Laparoscopy N/A 02/27/2014    Procedure: LAPAROSCOPY DIAGNOSTIC;  Surgeon: Lenoard Aden, MD;  Location: [facility];  Service: Gynecology;  Laterality: N/A;  . Laparoscopic lysis of adhesions N/A 02/27/2014    Procedure: LAPAROSCOPIC LYSIS OF ADHESIONS;  Surgeon: Lenoard Aden, MD;  Location: [facility];  Service:  Gynecology;  Laterality: N/A;    Family History  Problem Relation Age of Onset  . Fibromyalgia Mother   . Thrombocytopenia Mother   . Hepatitis C Mother   . Hyperlipidemia Father   . Narcolepsy Father     History  Substance Use Topics  . Smoking status: Never Smoker   . Smokeless tobacco: Never Used  . Alcohol Use: No     Comment: 3-4 times a month(wine)    Allergies:  Allergies  Allergen Reactions  . Erythromycin Anaphylaxis  . Latex Swelling and Rash    Swelling at contact site  . Zithromax [Azithromycin] Anaphylaxis  . Augmentin [Amoxicillin-Pot Clavulanate] Nausea And Vomiting    Prescriptions prior to admission  Medication Sig Dispense Refill  . cyclobenzaprine (FLEXERIL) 10 MG tablet Take 10 mg by mouth 3 (three) times daily as needed for muscle spasms.      Marland Kitchen albuterol (PROVENTIL HFA;VENTOLIN HFA) 108 (90 BASE) MCG/ACT inhaler Inhale 2 puffs into the lungs every 6 (six) hours as needed for wheezing or shortness of breath.      . metFORMIN (GLUCOPHAGE) 500 MG tablet Take 500 mg by mouth daily with breakfast.      .  metoprolol tartrate (LOPRESSOR) 25 MG tablet Take 50 mg by mouth daily.       . Prenatal Vit-Fe Fumarate-FA (PRENATAL MULTIVITAMIN) TABS tablet Take 1 tablet by mouth daily at 12 noon.        Review of Systems  Constitutional: Negative.   HENT: Negative.   Eyes: Negative.   Respiratory: Negative.   Cardiovascular: Negative.   Genitourinary: Negative for dysuria, urgency, frequency, hematuria and flank pain.       Uterine cramps; white vaginal d/c  Musculoskeletal: Positive for back pain.  Skin: Negative.   Neurological: Negative.   Endo/Heme/Allergies: Negative.   Psychiatric/Behavioral: Negative.     FHTs: 145 bpm  Results for orders placed during the hospital encounter of 08/03/14 (from the past 24 hour(s))  URINALYSIS, ROUTINE W REFLEX MICROSCOPIC     Status: Abnormal   Collection Time    08/03/14  5:38 AM      Result Value Ref Range    Color, Urine YELLOW  YELLOW   APPearance CLEAR  CLEAR   Specific Gravity, Urine <1.005 (*) 1.005 - 1.030   pH 6.0  5.0 - 8.0   Glucose, UA NEGATIVE  NEGATIVE mg/dL   Hgb urine dipstick NEGATIVE  NEGATIVE   Bilirubin Urine NEGATIVE  NEGATIVE   Ketones, ur NEGATIVE  NEGATIVE mg/dL   Protein, ur NEGATIVE  NEGATIVE mg/dL   Urobilinogen, UA 0.2  0.0 - 1.0 mg/dL   Nitrite NEGATIVE  NEGATIVE   Leukocytes, UA NEGATIVE  NEGATIVE   Physical Exam   Blood pressure 119/71, pulse 93, temperature 98.4 F (36.9 C), temperature source Oral, resp. rate 18, height  (1.626 m), weight 85.73 kg (189 lb), last menstrual period 02/09/2014, SpO2 100.00%.  Physical Exam  Constitutional: She is oriented to person, place, and time. She appears well-developed and well-nourished.  HENT:  Head: Normocephalic and atraumatic.  Eyes: Pupils are equal, round, and reactive to light.  Neck: Normal range of motion. Neck supple.  Cardiovascular: Normal rate, regular rhythm and normal heart sounds.   Respiratory: Effort normal and breath sounds normal.  GI: Soft. Bowel sounds are normal.  Genitourinary:  Gravid, soft, S=D; SSE: small amount of thick, white vaginal d/c; cervix closed/long/firm/posterior; no suprapubic pain  Musculoskeletal: Normal range of motion.  Neurological: She is alert and oriented to person, place, and time.  Skin: Skin is warm and dry.  Psychiatric: She has a normal mood and affect. Her behavior is normal. Judgment and thought content normal.    MAU Course  Procedures CCUA  Urine culture Assessment and Plan  33 yo G1P0 @ 19.5 wks Abdominal Pain of unknown etiology Lower back pain  Discharge Home Motrin 600 mg x 48 hrs Maintain good hydration Soak in warm tubs of water Moist heat to lower Continue chiropractic care Follow-up with Dr. Billy Coast 08/04/2014 afternoon  Raelyn Mora, M MSN, CNM 08/03/2014, 6:05 AM   **Amendment per patient request: The patient does not have  bipolar disorder.  This condition was entered by nursing in error.  Raelyn Mora, M MSN, CNM 02/16/2015 4:15 PM

## 2014-08-03 NOTE — MAU Note (Signed)
Lower abd and lower back pain. Denies bleeding or ROM

## 2014-08-03 NOTE — Discharge Instructions (Signed)
Abdominal Pain During Pregnancy Abdominal pain is common in pregnancy. Most of the time, it does not cause harm. There are many causes of abdominal pain. Some causes are more serious than others. Some of the causes of abdominal pain in pregnancy are easily diagnosed. Occasionally, the diagnosis takes time to understand. Other times, the cause is not determined. Abdominal pain can be a sign that something is very wrong with the pregnancy, or the pain may have nothing to do with the pregnancy at all. For this reason, always tell your health care provider if you have any abdominal discomfort. HOME CARE INSTRUCTIONS  Monitor your abdominal pain for any changes. The following actions may help to alleviate any discomfort you are experiencing:  Do not have sexual intercourse or put anything in your vagina until your symptoms go away completely.  Get plenty of rest until your pain improves.  Drink clear fluids if you feel nauseous. Avoid solid food as long as you are uncomfortable or nauseous.  Only take over-the-counter or prescription medicine as directed by your health care provider.  Keep all follow-up appointments with your health care provider. SEEK IMMEDIATE MEDICAL CARE IF:  You are bleeding, leaking fluid, or passing tissue from the vagina.  You have increasing pain or cramping.  You have persistent vomiting.  You have painful or bloody urination.  You have a fever.  You notice a decrease in your baby's movements.  You have extreme weakness or feel faint.  You have shortness of breath, with or without abdominal pain.  You develop a severe headache with abdominal pain.  You have abnormal vaginal discharge with abdominal pain.  You have persistent diarrhea.  You have abdominal pain that continues even after rest, or gets worse. MAKE SURE YOU:   Understand these instructions.  Will watch your condition.  Will get help right away if you are not doing well or get  worse. Document Released: 11/10/2005 Document Revised: 08/31/2013 Document Reviewed: 06/09/2013 [facility] Patient Information 2015 [place], [place]. This information is not intended to replace advice given to you by your health care provider. Make sure you discuss any questions you have with your health care provider.  Back Pain in Pregnancy Back pain during pregnancy is common. It happens in about half of all pregnancies. It is important for you and your baby that you remain active during your pregnancy.If you feel that back pain is not allowing you to remain active or sleep well, it is time to see your caregiver. Back pain may be caused by several factors related to changes during your pregnancy.Fortunately, unless you had trouble with your back before your pregnancy, the pain is likely to get better after you deliver. Low back pain usually occurs between the fifth and seventh months of pregnancy. It can, however, happen in the first couple months. Factors that increase the risk of back problems include:   Previous back problems.  Injury to your back.  Having twins or multiple births.  A chronic cough.  Stress.  Job-related repetitive motions.  Muscle or spinal disease in the back.  Family history of back problems, ruptured (herniated) discs, or osteoporosis.  Depression, anxiety, and panic attacks. CAUSES   When you are pregnant, your body produces a hormone called relaxin. This hormonemakes the ligaments connecting the low back and pubic bones more flexible. This flexibility allows the baby to be delivered more easily. When your ligaments are loose, your muscles need to work harder to support your back. Soreness in your back  can come from tired muscles. Soreness can also come from back tissues that are irritated since they are receiving less support.  As the baby grows, it puts pressure on the nerves and blood vessels in your pelvis. This can cause back pain.  As the baby grows  and gets heavier during pregnancy, the uterus pushes the stomach muscles forward and changes your center of gravity. This makes your back muscles work harder to maintain good posture. SYMPTOMS  Lumbar pain during pregnancy Lumbar pain during pregnancy usually occurs at or above the waist in the center of the back. There may be pain and numbness that radiates into your leg or foot. This is similar to low back pain experienced by non-pregnant women. It usually increases with sitting for long periods of time, standing, or repetitive lifting. Tenderness may also be present in the muscles along your upper back. Posterior pelvic pain during pregnancy Pain in the back of the pelvis is more common than lumbar pain in pregnancy. It is a deep pain felt in your side at the waistline, or across the tailbone (sacrum), or in both places. You may have pain on one or both sides. This pain can also go into the buttocks and backs of the upper thighs. Pubic and groin pain may also be present. The pain does not quickly resolve with rest, and morning stiffness may also be present. Pelvic pain during pregnancy can be brought on by most activities. A high level of fitness before and during pregnancy may or may not prevent this problem. Labor pain is usually 1 to 2 minutes apart, lasts for about 1 minute, and involves a bearing down feeling or pressure in your pelvis. However, if you are at term with the pregnancy, constant low back pain can be the beginning of early labor, and you should be aware of this. DIAGNOSIS  X-rays of the back should not be done during the first 12 to 14 weeks of the pregnancy and only when absolutely necessary during the rest of the pregnancy. MRIs do not give off radiation and are safe during pregnancy. MRIs also should only be done when absolutely necessary. HOME CARE INSTRUCTIONS  Exercise as directed by your caregiver. Exercise is the most effective way to prevent or manage back pain. If you have a  back problem, it is especially important to avoid sports that require sudden body movements. Swimming and walking are great activities.  Do not stand in one place for long periods of time.  Do not wear high heels.  Sit in chairs with good posture. Use a pillow on your lower back if necessary. Make sure your head rests over your shoulders and is not hanging forward.  Try sleeping on your side, preferably the left side, with a pillow or two between your legs. If you are sore after a night's rest, your bedmay betoo soft.Try placing a board between your mattress and box spring.  Listen to your body when lifting.If you are experiencing pain, ask for help or try bending yourknees more so you can use your leg muscles rather than your back muscles. Squat down when picking up something from the floor. Do not bend over.  Eat a healthy diet. Try to gain weight within your caregiver's recommendations.  Use heat or cold packs 3 to 4 times a day for 15 minutes to help with the pain.  Only take over-the-counter or prescription medicines for pain, discomfort, or fever as directed by your caregiver. Sudden (acute) back pain  Use bed rest for only the most extreme, acute episodes of back pain. Prolonged bed rest over 48 hours will aggravate your condition.  Ice is very effective for acute conditions.  Put ice in a plastic bag.  Place a towel between your skin and the bag.  Leave the ice on for 10 to 20 minutes every 2 hours, or as needed.  Using heat packs for 30 minutes prior to activities is also helpful. Continued back pain See your caregiver if you have continued problems. Your caregiver can help or refer you for appropriate physical therapy. With conditioning, most back problems can be avoided. Sometimes, a more serious issue may be the cause of back pain. You should be seen right away if new problems seem to be developing. Your caregiver may recommend:  A maternity girdle.  An elastic  sling.  A back brace.  A massage therapist or acupuncture. SEEK MEDICAL CARE IF:   You are not able to do most of your daily activities, even when taking the pain medicine you were given.  You need a referral to a physical therapist or chiropractor.  You want to try acupuncture. SEEK IMMEDIATE MEDICAL CARE IF:  You develop numbness, tingling, weakness, or problems with the use of your arms or legs.  You develop severe back pain that is no longer relieved with medicines.  You have a sudden change in bowel or bladder control.  You have increasing pain in other areas of the body.  You develop shortness of breath, dizziness, or fainting.  You develop nausea, vomiting, or sweating.  You have back pain which is similar to labor pains.  You have back pain along with your water breaking or vaginal bleeding.  You have back pain or numbness that travels down your leg.  Your back pain developed after you fell.  You develop pain on one side of your back. You may have a kidney stone.  You see blood in your urine. You may have a bladder infection or kidney stone.  You have back pain with blisters. You may have shingles. Back pain is fairly common during pregnancy but should not be accepted as just part of the process. Back pain should always be treated as soon as possible. This will make your pregnancy as pleasant as possible. Document Released: 02/18/2006 Document Revised: 02/02/2012 Document Reviewed: 04/01/2011 [facility] Patient Information 2015 [place], [place]. This information is not intended to replace advice given to you by your health care provider. Make sure you discuss any questions you have with your health care provider. Abdominal Pain During Pregnancy Abdominal pain is common in pregnancy. Most of the time, it does not cause harm. There are many causes of abdominal pain. Some causes are more serious than others. Some of the causes of abdominal pain in pregnancy are easily  diagnosed. Occasionally, the diagnosis takes time to understand. Other times, the cause is not determined. Abdominal pain can be a sign that something is very wrong with the pregnancy, or the pain may have nothing to do with the pregnancy at all. For this reason, always tell your health care provider if you have any abdominal discomfort. HOME CARE INSTRUCTIONS  Monitor your abdominal pain for any changes. The following actions may help to alleviate any discomfort you are experiencing:  Do not have sexual intercourse or put anything in your vagina until your symptoms go away completely.  Get plenty of rest until your pain improves.  Drink clear fluids if you feel nauseous. Avoid solid  food as long as you are uncomfortable or nauseous.  Only take over-the-counter or prescription medicine as directed by your health care provider.  Keep all follow-up appointments with your health care provider. SEEK IMMEDIATE MEDICAL CARE IF:  You are bleeding, leaking fluid, or passing tissue from the vagina.  You have increasing pain or cramping.  You have persistent vomiting.  You have painful or bloody urination.  You have a fever.  You notice a decrease in your baby's movements.  You have extreme weakness or feel faint.  You have shortness of breath, with or without abdominal pain.  You develop a severe headache with abdominal pain.  You have abnormal vaginal discharge with abdominal pain.  You have persistent diarrhea.  You have abdominal pain that continues even after rest, or gets worse. MAKE SURE YOU:   Understand these instructions.  Will watch your condition.  Will get help right away if you are not doing well or get worse. Document Released: 11/10/2005 Document Revised: 08/31/2013 Document Reviewed: 06/09/2013 [facility] Patient Information 2015 [place], [place]. This information is not intended to replace advice given to you by your health care provider. Make sure you discuss any  questions you have with your health care provider.

## 2014-08-04 LAB — CULTURE, OB URINE
Colony Count: 80000
Special Requests: NORMAL

## 2014-08-30 ENCOUNTER — Encounter: Payer: Self-pay | Admitting: Internal Medicine

## 2014-08-30 ENCOUNTER — Ambulatory Visit (INDEPENDENT_AMBULATORY_CARE_PROVIDER_SITE_OTHER): Payer: BC Managed Care – PPO | Admitting: Internal Medicine

## 2014-08-30 VITALS — BP 116/62 | HR 99 | Ht 64.0 in | Wt 194.4 lb

## 2014-08-30 DIAGNOSIS — R002 Palpitations: Secondary | ICD-10-CM | POA: Insufficient documentation

## 2014-08-30 HISTORY — DX: Palpitations: R00.2

## 2014-08-30 NOTE — Patient Instructions (Signed)
Your physician recommends that you continue on your current medications as directed. Please refer to the Current Medication list given to you today.  Your physician wants you to follow-up in: 2  Months with Dr. Court Joyaylor You will receive a reminder letter in the mail two months in advance. If you don't receive a letter, please call our office to schedule the follow-up appointment.  Thank you for visiting with [facility] today!!

## 2014-08-30 NOTE — Assessment & Plan Note (Signed)
Her symptoms are currently under reasonable control. As long as the baby is growing appropriately, I would continue Toprol 75 mg daily. At approximately [redacted] weeks gestation, I would anticipate trying to wean her dose of beta blocker down. I plan to see her back in 8 weeks. No indication for an echocardiogram at this point. If she developed shortness of breath, this would be considered. I suspect the patient will ultimately be found to have autonomic dysfunction. This will be addressed further postpartum.

## 2014-08-30 NOTE — Progress Notes (Addendum)
HPI Kim Kennedy is referred today by Dr. Billy Coastaavon for evaluation of tachycardia palpitations. Her history dates back over 10 years. She saw Dr. Alanda AmassWeintraub and was prescribed a beta blocker. Over the years, she has tried to wean herself off of her beta blocker but had recurrent palpitations. There is no relation to exertion. The episodes do not start and stop suddenly. She were cardiac monitor in 2011 which demonstrated what she describes as sinus tachycardia at 140 beats per minute. She was initially on Toprol 75 mg daily. Her dose was decreased to 50 mg daily on becoming pregnant, but over the last few weeks, the patient has increased her dose because of the sensation of palpitations and hard heart beating. She has not had syncope. Apparently her pregnancy is otherwise going well. We have no documented SVT. Allergies  Allergen Reactions  . Erythromycin Anaphylaxis  . Latex Swelling and Rash    Swelling at contact site  . Zithromax [Azithromycin] Anaphylaxis and Other (See Comments)    Tightness in chest  . Augmentin [Amoxicillin-Pot Clavulanate] Diarrhea and Nausea And Vomiting  . Penicillins Diarrhea and Nausea And Vomiting     Current Outpatient Prescriptions  Medication Sig Dispense Refill  . cyclobenzaprine (FLEXERIL) 10 MG tablet Take 10 mg by mouth 3 (three) times daily as needed for muscle spasms.      Marland Kitchen. HYDROXYZINE HCL PO Take 1 tablet by mouth as needed. Specific dose unknown      . metFORMIN (GLUCOPHAGE) 500 MG tablet Take 500 mg by mouth 2 (two) times daily with a meal.       . metoprolol succinate (TOPROL-XL) 50 MG 24 hr tablet Take 50 mg by mouth daily. Take with or immediately following a meal. Pt can take 75 mg when needed.      . Prenatal Vit-Fe Fumarate-FA (PRENATAL MULTIVITAMIN) TABS tablet Take 1 tablet by mouth daily at 12 noon.      . Probiotic Product (PROBIOTIC PO) Take 1 capsule by mouth daily.      Marland Kitchen. albuterol (PROVENTIL HFA;VENTOLIN HFA) 108 (90 BASE) MCG/ACT  inhaler Inhale 2 puffs into the lungs every 6 (six) hours as needed for wheezing or shortness of breath.       No current facility-administered medications for this visit.     Past Medical History  Diagnosis Date  . Polycystic ovarian syndrome 2011    Borderline A1C  . 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 Dysrhythmia     Hx tachycardia - tx with lopressor  . Inactive TB 2011    chest xray neg  . Asthma     prn -seasonal use of inhaler only  . Bipolar disorder     hx - no meds  . Anxiety     hx - no meds  . GERD (gastroesophageal reflux disease)     occasional - diet control - no meds  . Fibromyalgia 2007    no meds  . Tachycardia   . History of asthma   . IBS (irritable bowel syndrome)   . LGSIL of cervix of undetermined significance   . Nausea   . Osteoarthritis   . Cramping affecting pregnancy, antepartum   . GBS (group B streptococcus) UTI complicating pregnancy   . Chest tightness     ROS:   All systems reviewed and negative except as noted in the HPI.   Past Surgical History  Procedure Laterality Date  . Tonsillectomy and adenoidectomy  2000  . Mandible fracture  surgery  03/2001    x 7 jaw surgeries. TMJ  . Wisdom tooth extraction    . Nasal septum surgery  03/2001  . Laparoscopy N/A 02/27/2014    Procedure: LAPAROSCOPY DIAGNOSTIC;  Surgeon: Lenoard Aden, MD;  Location: [facility];  Service: Gynecology;  Laterality: N/A;  . Laparoscopic lysis of adhesions N/A 02/27/2014    Procedure: LAPAROSCOPIC LYSIS OF ADHESIONS;  Surgeon: Lenoard Aden, MD;  Location: [facility];  Service: Gynecology;  Laterality: N/A;     Family History  Problem Relation Age of Onset  . Fibromyalgia Mother   . Thrombocytopenia Mother     had spleen removed  . Hepatitis C Mother   . Osteoporosis Mother   . Anxiety disorder Mother   . Hyperlipidemia Father   . Narcolepsy Father   . Diabetes Paternal Uncle     DM  . Heart attack Maternal Grandmother   . Heart attack Maternal  Grandfather   . Heart attack Paternal Grandmother   . Osteoporosis Paternal Grandmother   . Heart attack Paternal Grandfather      History   Social History  . Marital Status: Married    Spouse Name: N/A    Number of Children: N/A  . Years of Education: N/A   Occupational History  . Not on file.   Social History Main Topics  . Smoking status: Never Smoker   . Smokeless tobacco: Never Used  . Alcohol Use: 0.0 oz/week     Comment: 1 glass of wine/week  . Drug Use: No  . Sexual Activity: Yes    Partners: Male    Birth Control/ Protection: None   Other Topics Concern  . Not on file   Social History Narrative  . No narrative on file     BP 116/62  Pulse 99  Ht 5\' 4"  (1.626 m)  Wt 194 lb 6.4 oz (88.179 kg)  BMI 33.35 kg/m2  LMP 02/09/2014  Physical Exam:  Well appearing gravid 33 year old woman, NAD HEENT: Unremarkable Neck:  No JVD, no thyromegally Back:  No CVA tenderness Lungs:  Clear with no wheezes, rales, or rhonchi. HEART:  Regular rate rhythm, no murmurs, no rubs, no clicks Abd:  soft, positive bowel sounds, no organomegally, no rebound, no guarding Ext:  2 plus pulses, no edema, no cyanosis, no clubbing Skin:  No rashes no nodules Neuro:  CN II through XII intact, motor grossly intact  EKG - normal sinus rhythm with nonspecific ST-T wave abnormality   Assess/Plan:  Addendum: I have reviewed the patient's chart and can find no evidence of bipolar disorder. This diagnosis mentioned in the above past medical history is in error.   Kim Kennedy.D.

## 2014-09-12 ENCOUNTER — Ambulatory Visit: Payer: BC Managed Care – PPO | Admitting: Internal Medicine

## 2014-09-25 ENCOUNTER — Encounter: Payer: Self-pay | Admitting: Internal Medicine

## 2014-10-31 ENCOUNTER — Encounter: Payer: Self-pay | Admitting: Internal Medicine

## 2014-10-31 ENCOUNTER — Ambulatory Visit (INDEPENDENT_AMBULATORY_CARE_PROVIDER_SITE_OTHER): Payer: BC Managed Care – PPO | Admitting: Internal Medicine

## 2014-10-31 VITALS — BP 128/62 | HR 103 | Ht 64.75 in | Wt 199.6 lb

## 2014-10-31 DIAGNOSIS — Z3492 Encounter for supervision of normal pregnancy, unspecified, second trimester: Secondary | ICD-10-CM

## 2014-10-31 DIAGNOSIS — R002 Palpitations: Secondary | ICD-10-CM

## 2014-10-31 DIAGNOSIS — Z331 Pregnant state, incidental: Secondary | ICD-10-CM

## 2014-10-31 NOTE — Patient Instructions (Signed)
Your physician recommends that you schedule a follow-up appointment in: 4 months with Dr. Ladona Ridgelaylor   Your physician has recommended you make the following change in your medication:  1) Decrease Metoprolol to 1/2 tablet every other day alternating with 1 tablet for 3 weeks then decrease to 1/2 tablet daily until delivery

## 2014-10-31 NOTE — Progress Notes (Addendum)
HPI [name] returns today for ongoing evaluation of tachypalpitations. Her history dates back over 10 years. She saw Dr. Alanda AmassWeintraub and was prescribed a beta blocker. Over the years, she has tried to wean herself off of her beta blocker but had recurrent palpitations. There is no relation to exertion. The episodes do not start and stop suddenly. She wore a cardiac monitor in 2011 which demonstrated what she describes as sinus tachycardia at 140 beats per minute. She was initially on Toprol 75 mg daily. Her dose was decreased to 50 mg daily on becoming pregnant. She has not had syncope.  Her pregnancy is otherwise going well. We have no documented SVT.  Allergies  Allergen Reactions  . Erythromycin Anaphylaxis  . Latex Swelling and Rash    Swelling at contact site  . Zithromax [Azithromycin] Anaphylaxis and Other (See Comments)    Tightness in chest  . Augmentin [Amoxicillin-Pot Clavulanate] Diarrhea and Nausea And Vomiting  . Penicillins Diarrhea and Nausea And Vomiting     Current Outpatient Prescriptions  Medication Sig Dispense Refill  . acetaminophen (TYLENOL) 500 MG tablet Pt is taking 500 mg by mouth three times a day and 100 mg by mouth at night    . albuterol (PROVENTIL HFA;VENTOLIN HFA) 108 (90 BASE) MCG/ACT inhaler Inhale 2 puffs into the lungs every 6 (six) hours as needed for wheezing or shortness of breath.    . doxylamine, Sleep, (UNISOM) 25 MG tablet Take 25 mg by mouth at bedtime as needed for sleep.    . metFORMIN (GLUCOPHAGE-XR) 500 MG 24 hr tablet Take 1 tablet by mouth 2 (two) times daily.    . metoprolol succinate (TOPROL-XL) 50 MG 24 hr tablet Take 50 mg by mouth daily. Take with or immediately following a meal. Pt can take 75 mg when needed.    . Prenatal Vit-Fe Fumarate-FA (PRENATAL MULTIVITAMIN) TABS tablet Take 1 tablet by mouth daily at 12 noon.    . Probiotic Product (PROBIOTIC PO) Take 1 capsule by mouth daily.    . cyclobenzaprine (FLEXERIL) 10 MG  tablet Take 10 mg by mouth 3 (three) times daily as needed for muscle spasms.    Marland Kitchen. HYDROXYZINE HCL PO Take 1 tablet by mouth daily as needed (sleep). Specific dose unknown     No current facility-administered medications for this visit.     Past Medical History  Diagnosis Date  . Polycystic ovarian syndrome 2011    Borderline A1C  . 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 Dysrhythmia     Hx tachycardia - tx with lopressor  . Inactive TB 2011    chest xray neg  . Asthma     prn -seasonal use of inhaler only  . Bipolar disorder     hx - no meds  . Anxiety     hx - no meds  . GERD (gastroesophageal reflux disease)     occasional - diet control - no meds  . Fibromyalgia 2007    no meds  . Tachycardia   . History of asthma   . IBS (irritable bowel syndrome)   . LGSIL of cervix of undetermined significance   . Nausea   . Osteoarthritis   . Cramping affecting pregnancy, antepartum   . GBS (group B streptococcus) UTI complicating pregnancy   . Chest tightness     ROS:   All systems reviewed and negative except as noted in the HPI.   Past Surgical History  Procedure Laterality Date  .  Tonsillectomy and adenoidectomy  2000  . Mandible fracture surgery  03/2001    x 7 jaw surgeries. TMJ  . Wisdom tooth extraction    . Nasal septum surgery  03/2001  . Laparoscopy N/A 02/27/2014    Procedure: LAPAROSCOPY DIAGNOSTIC;  Surgeon: Lenoard Adenichard J Taavon, MD;  Location: [facility];  Service: Gynecology;  Laterality: N/A;  . Laparoscopic lysis of adhesions N/A 02/27/2014    Procedure: LAPAROSCOPIC LYSIS OF ADHESIONS;  Surgeon: Lenoard Adenichard J Taavon, MD;  Location: [facility];  Service: Gynecology;  Laterality: N/A;     Family History  Problem Relation Age of Onset  . Fibromyalgia Mother   . Thrombocytopenia Mother     had spleen removed  . Hepatitis C Mother   . Osteoporosis Mother   . Anxiety disorder Mother   . Hyperlipidemia Father   . Narcolepsy Father   . Diabetes Paternal Uncle     DM  .  Heart attack Maternal Grandmother   . Heart attack Maternal Grandfather   . Heart attack Paternal Grandmother   . Osteoporosis Paternal Grandmother   . Heart attack Paternal Grandfather      History   Social History  . Marital Status: Married    Spouse Name: N/A    Number of Children: N/A  . Years of Education: N/A   Occupational History  . Not on file.   Social History Main Topics  . Smoking status: Never Smoker   . Smokeless tobacco: Never Used  . Alcohol Use: 0.0 oz/week     Comment: 1 glass of wine/week  . Drug Use: No  . Sexual Activity:    Partners: Male    Birth Control/ Protection: None   Other Topics Concern  . Not on file   Social History Narrative     BP 128/62 mmHg  Pulse 103  Ht 5' 4.75" (1.645 m)  Wt 199 lb 9.6 oz (90.538 kg)  BMI 33.46 kg/m2  LMP 02/09/2014  Physical Exam:  Well appearing gravid 33 year old woman, NAD HEENT: Unremarkable Neck:  No JVD, no thyromegally Back:  No CVA tenderness Lungs:  Clear with no wheezes, rales, or rhonchi. HEART:  Regular rate rhythm, no murmurs, no rubs, no clicks Abd:  soft, positive bowel sounds, no organomegally, no rebound, no guarding Ext:  2 plus pulses, no edema, no cyanosis, no clubbing Skin:  No rashes no nodules Neuro:  CN II through XII intact, motor grossly intact    Assess/Plan:  I have reviewed the patient's medical records and can find no evidence of Bipolar disorder. The was entered in error and is not part of the patient's medical history.   Leonia ReevesGregg Tava Peery,M.D.

## 2014-10-31 NOTE — Assessment & Plan Note (Signed)
At this point her symptoms appear to be well-controlled. We discussed the possibility of weaning off her dose of metoprolol. She will take 1 tablet daily and then half tablet every other day.

## 2014-10-31 NOTE — Assessment & Plan Note (Signed)
She tells me her pregnancy is going well. She will call us if she has any arrhythmia problems as her pregnancy progresses.

## 2014-11-22 ENCOUNTER — Inpatient Hospital Stay (HOSPITAL_COMMUNITY)
Admission: AD | Admit: 2014-11-22 | Discharge: 2014-11-23 | Disposition: A | Discharge status: Home / Self Care | Payer: BC Managed Care – PPO | Source: Ambulatory Visit | Attending: Obstetrics and Gynecology | Admitting: Obstetrics and Gynecology

## 2014-11-22 ENCOUNTER — Encounter (HOSPITAL_COMMUNITY): Payer: Self-pay | Admitting: *Deleted

## 2014-11-22 DIAGNOSIS — N949 Unspecified condition associated with female genital organs and menstrual cycle: Secondary | ICD-10-CM | POA: Insufficient documentation

## 2014-11-22 DIAGNOSIS — O99613 Diseases of the digestive system complicating pregnancy, third trimester: Secondary | ICD-10-CM | POA: Insufficient documentation

## 2014-11-22 DIAGNOSIS — O321XX0 Maternal care for breech presentation, not applicable or unspecified: Secondary | ICD-10-CM | POA: Diagnosis present

## 2014-11-22 DIAGNOSIS — Z3493 Encounter for supervision of normal pregnancy, unspecified, third trimester: Secondary | ICD-10-CM | POA: Insufficient documentation

## 2014-11-22 DIAGNOSIS — F419 Anxiety disorder, unspecified: Secondary | ICD-10-CM | POA: Insufficient documentation

## 2014-11-22 DIAGNOSIS — Z3A35 35 weeks gestation of pregnancy: Secondary | ICD-10-CM | POA: Insufficient documentation

## 2014-11-22 DIAGNOSIS — R109 Unspecified abdominal pain: Secondary | ICD-10-CM | POA: Diagnosis present

## 2014-11-22 DIAGNOSIS — O99513 Diseases of the respiratory system complicating pregnancy, third trimester: Secondary | ICD-10-CM | POA: Diagnosis not present

## 2014-11-22 DIAGNOSIS — K219 Gastro-esophageal reflux disease without esophagitis: Secondary | ICD-10-CM | POA: Diagnosis not present

## 2014-11-22 HISTORY — DX: Encounter for supervision of normal pregnancy, unspecified, third trimester: Z34.93

## 2014-11-22 HISTORY — DX: Maternal care for breech presentation, not applicable or unspecified: O32.1XX0

## 2014-11-22 LAB — URINALYSIS, ROUTINE W REFLEX MICROSCOPIC
Bilirubin Urine: NEGATIVE
Glucose, UA: NEGATIVE mg/dL
Hgb urine dipstick: NEGATIVE
Ketones, ur: NEGATIVE mg/dL
Leukocytes, UA: NEGATIVE
Nitrite: NEGATIVE
Protein, ur: NEGATIVE mg/dL
Specific Gravity, Urine: >1.03 — ABNORMAL HIGH (ref 1.005–1.030)
Urobilinogen, UA: 0.2 mg/dL (ref 0.0–1.0)
pH: 5.5 (ref 5.0–8.0)

## 2014-11-22 NOTE — Progress Notes (Signed)
Pt states she has been treated for anxiety with therapy

## 2014-11-22 NOTE — Discharge Instructions (Signed)
Round Ligament Pain During Pregnancy °Round ligament pain is a sharp pain or jabbing feeling often felt in the lower belly or groin area on one or both sides. It is one of the most common complaints during pregnancy and is considered a normal part of pregnancy. It is most often felt during the second trimester. ° °Here is what you need to know about round ligament pain, including some tips to help you feel better. ° °Causes of Round Ligament Pain ° °Several thick ligaments surround and support your womb (uterus) as it grows during pregnancy. One of them is called the round ligament. ° °The round ligament connects the front part of the womb to your groin, the area where your legs attach to your pelvis. The round ligament normally tightens and relaxes slowly. ° °As your baby and womb grow, the round ligament stretches. That makes it more likely to become strained. ° °Sudden movements can cause the ligament to tighten quickly, like a rubber band snapping. This causes a sudden and quick jabbing feeling. ° °Symptoms of Round Ligament Pain ° °Round ligament pain can be concerning and uncomfortable. But it is considered normal as your body changes during pregnancy. ° °The symptoms of round ligament pain include a sharp, sudden spasm in the belly. It usually affects the right side, but it may happen on both sides. The pain only lasts a few seconds. ° °Exercise may cause the pain, as will rapid movements such as: ° °sneezing °coughing °laughing °rolling over in bed °standing up too quickly ° °

## 2014-11-22 NOTE — MAU Provider Note (Addendum)
History     CSN: [id number]  Arrival date and time: 11/22/14 2130  Orders entered in EPIC: 2149 Provider notified: 2206 Provider at bedside: 2330      Chief Complaint  Patient presents with  . Abdominal Pain   HPI  Ms. Kim Kennedy is a 33 yo G1P0 at 35.[redacted] wks gestation presenting tonight with complaints of a sudden sharp pain in LT side of abdomen while at a childbirth class.  She was going to go home to rest, but the pain intensified before leaving the hospital grounds. She was advised by the RN who taught her childbirth class to call her OB office and then be evaluated in MAU.  The patient did not call before registering in MAU d/t increase in pain and inability to walk upright. She reports having an episode of vomiting while in her class earlier.  The vomiting did not relieve the pain.  Her primary OB WOB provider is Dr. Billy Coastaavon. She was seen in the office today for regular OB visit and denies having any problems at that time.  Past Medical History  Diagnosis Date  . Polycystic ovarian syndrome 2011    Borderline A1C  . 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 Dysrhythmia     Hx tachycardia - tx with lopressor  . Inactive TB 2011    chest xray neg  . Asthma     prn -seasonal use of inhaler only  . Bipolar disorder     hx - no meds  . Anxiety     hx - no meds  . GERD (gastroesophageal reflux disease)     occasional - diet control - no meds  . Fibromyalgia 2007    no meds  . Tachycardia   . History of asthma   . IBS (irritable bowel syndrome)   . LGSIL of cervix of undetermined significance   . Nausea   . Osteoarthritis   . Cramping affecting pregnancy, antepartum   . GBS (group B streptococcus) UTI complicating pregnancy   . Chest tightness   . Third trimester pregnancy at less than 36 weeks 11/22/2014  . Breech presentation 11/22/2014    Past Surgical History  Procedure Laterality Date  . Tonsillectomy and adenoidectomy  2000  . Mandible fracture surgery  03/2001     x 7 jaw surgeries. TMJ  . Wisdom tooth extraction    . Nasal septum surgery  03/2001  . Laparoscopy N/A 02/27/2014    Procedure: LAPAROSCOPY DIAGNOSTIC;  Surgeon: Lenoard Adenichard J Taavon, MD;  Location: [facility];  Service: Gynecology;  Laterality: N/A;  . Laparoscopic lysis of adhesions N/A 02/27/2014    Procedure: LAPAROSCOPIC LYSIS OF ADHESIONS;  Surgeon: Lenoard Adenichard J Taavon, MD;  Location: [facility];  Service: Gynecology;  Laterality: N/A;    Family History  Problem Relation Age of Onset  . Fibromyalgia Mother   . Thrombocytopenia Mother     had spleen removed  . Hepatitis C Mother   . Osteoporosis Mother   . Anxiety disorder Mother   . Hyperlipidemia Father   . Narcolepsy Father   . Diabetes Paternal Uncle     DM  . Heart attack Maternal Grandmother   . Heart attack Maternal Grandfather   . Heart attack Paternal Grandmother   . Osteoporosis Paternal Grandmother   . Heart attack Paternal Grandfather     History  Substance Use Topics  . Smoking status: Never Smoker   . Smokeless tobacco: Never Used  . Alcohol Use: 0.0 oz/week  Comment: 1 glass of wine/week    Allergies:  Allergies  Allergen Reactions  . Erythromycin Anaphylaxis  . Latex Swelling and Rash    Swelling at contact site  . Zithromax [Azithromycin] Anaphylaxis and Other (See Comments)    Tightness in chest  . Augmentin [Amoxicillin-Pot Clavulanate] Diarrhea and Nausea And Vomiting  . Penicillins Diarrhea and Nausea And Vomiting    Prescriptions prior to admission  Medication Sig Dispense Refill Last Dose  . acetaminophen (TYLENOL) 500 MG tablet Pt is taking 500 mg by mouth three times a day and 100 mg by mouth at night     . albuterol (PROVENTIL HFA;VENTOLIN HFA) 108 (90 BASE) MCG/ACT inhaler Inhale 2 puffs into the lungs every 6 (six) hours as needed for wheezing or shortness of breath.   Taking  . cyclobenzaprine (FLEXERIL) 10 MG tablet Take 10 mg by mouth 3 (three) times daily as needed for muscle spasms.   Not  Taking  . doxylamine, Sleep, (UNISOM) 25 MG tablet Take 25 mg by mouth at bedtime as needed for sleep.   Taking  . HYDROXYZINE HCL PO Take 1 tablet by mouth daily as needed (sleep). Specific dose unknown   Not Taking  . metFORMIN (GLUCOPHAGE-XR) 500 MG 24 hr tablet Take 1 tablet by mouth 2 (two) times daily.   Taking  . metoprolol succinate (TOPROL-XL) 50 MG 24 hr tablet 50 mg.   Taking  . Prenatal Vit-Fe Fumarate-FA (PRENATAL MULTIVITAMIN) TABS tablet Take 1 tablet by mouth daily at 12 noon.   Taking  . Probiotic Product (PROBIOTIC PO) Take 1 capsule by mouth daily.   Taking    Review of Systems  Constitutional: Negative.   HENT: Negative.   Eyes: Negative.   Respiratory: Negative.   Cardiovascular: Negative.   Gastrointestinal: Positive for abdominal pain.       LT mid-abdominal intermittent pain that radiates to LT flank area  Genitourinary: Negative.        (+) FM, no VB or LOF  Musculoskeletal: Negative.   Skin: Negative.   Neurological: Negative.   Endo/Heme/Allergies: Negative.   Psychiatric/Behavioral: Negative.    Results for orders placed or performed during the hospital encounter of 11/22/14 (from the past 24 hour(s))  Urinalysis, Routine w reflex microscopic     Status: Abnormal   Collection Time: 11/22/14  9:35 PM  Result Value Ref Range   Color, Urine YELLOW YELLOW   APPearance CLEAR CLEAR   Specific Gravity, Urine >1.030 (H) 1.005 - 1.030   pH 5.5 5.0 - 8.0   Glucose, UA NEGATIVE NEGATIVE mg/dL   Hgb urine dipstick NEGATIVE NEGATIVE   Bilirubin Urine NEGATIVE NEGATIVE   Ketones, ur NEGATIVE NEGATIVE mg/dL   Protein, ur NEGATIVE NEGATIVE mg/dL   Urobilinogen, UA 0.2 0.0 - 1.0 mg/dL   Nitrite NEGATIVE NEGATIVE   Leukocytes, UA NEGATIVE NEGATIVE   Physical Exam   Blood pressure 134/75, pulse 105, resp. rate 16, last menstrual period 02/09/2014.  Physical Exam  Constitutional: She is oriented to person, place, and time. She appears well-developed and  well-nourished.  HENT:  Head: Normocephalic and atraumatic.  Eyes: Pupils are equal, round, and reactive to light.  Neck: Normal range of motion.  Cardiovascular: Normal rate, regular rhythm and normal heart sounds.   Respiratory: Effort normal and breath sounds normal.  GI: Soft. Bowel sounds are normal.  Genitourinary:  Cervix: closed/long/high; breech presentation by Leopold's maneuvers - fetal head in RUQ; palpable FM  Musculoskeletal: Normal range of motion.  Neurological:  She is alert and oriented to person, place, and time. She has normal reflexes.  Skin: Skin is warm and dry.  Psychiatric: She has a normal mood and affect. Her behavior is normal. Judgment and thought content normal.    MAU Course  Procedures CCUA CEFM  Assessment and Plan  33 yo G1P0 @ 35.[redacted] wks gestation Round Ligament Pain vs LT flank pain of unknown etiology Breech Presentation Category 1 FHR tracing  Discharge Home Hydrate with at least 1-2 liters of water daily Soak in Warm bath  Tylenol 1000 mg every 8 hrs prn pain Keep scheduled appointment with Dr. Billy Coast Call the office, if sx's not improved or worsen  Kenard Gower MSN, CNM 11/22/2014, 11:44 PM   **Amendment per patient request: The patient does not have bipolar disorder.  This condition was entered by nursing in error.  Kim Kennedy, M MSN, CNM 02/16/2015 4:12 PM

## 2014-11-22 NOTE — MAU Note (Signed)
Pt states she started having pain on her left side while sitting in birthing  class. Pt states she felt sharp stabbing pain. Pt states pain became more intense. Pt states she waited awhile and then decided that she might need to check this pain out.

## 2014-12-04 ENCOUNTER — Other Ambulatory Visit: Payer: Self-pay

## 2014-12-04 MED ORDER — METOPROLOL SUCCINATE ER 25 MG PO TB24
25.0000 mg | ORAL_TABLET | Freq: Every day | ORAL | Status: DC
Start: 2014-12-04 — End: 2014-12-07

## 2014-12-05 ENCOUNTER — Other Ambulatory Visit: Payer: Self-pay | Admitting: Obstetrics and Gynecology

## 2014-12-07 ENCOUNTER — Other Ambulatory Visit: Payer: Self-pay | Admitting: *Deleted

## 2014-12-07 MED ORDER — METOPROLOL SUCCINATE ER 25 MG PO TB24: ORAL_TABLET | ORAL | Status: DC

## 2014-12-22 ENCOUNTER — Encounter (HOSPITAL_COMMUNITY): Payer: Self-pay

## 2014-12-25 ENCOUNTER — Encounter (HOSPITAL_COMMUNITY): Payer: Self-pay

## 2014-12-25 ENCOUNTER — Encounter (HOSPITAL_COMMUNITY)
Admission: RE | Admit: 2014-12-25 | Discharge: 2014-12-25 | Disposition: A | Discharge status: Home / Self Care | Payer: BLUE CROSS/BLUE SHIELD | Source: Ambulatory Visit | Attending: Obstetrics and Gynecology | Admitting: Obstetrics and Gynecology

## 2014-12-25 ENCOUNTER — Telehealth: Payer: Self-pay | Admitting: Internal Medicine

## 2014-12-25 VITALS — BP 123/89 | HR 100 | Resp 18 | Ht 64.0 in | Wt 208.0 lb

## 2014-12-25 DIAGNOSIS — Z01818 Encounter for other preprocedural examination: Secondary | ICD-10-CM | POA: Insufficient documentation

## 2014-12-25 DIAGNOSIS — N950 Postmenopausal bleeding: Secondary | ICD-10-CM

## 2014-12-25 DIAGNOSIS — Z3493 Encounter for supervision of normal pregnancy, unspecified, third trimester: Secondary | ICD-10-CM

## 2014-12-25 HISTORY — DX: Major depressive disorder, single episode, unspecified: F32.9

## 2014-12-25 HISTORY — DX: Depression, unspecified: F32.A

## 2014-12-25 LAB — BASIC METABOLIC PANEL
Anion gap: 8 (ref 5–15)
BUN: 12 mg/dL (ref 6–23)
CO2: 24 mmol/L (ref 19–32)
Calcium: 8.6 mg/dL (ref 8.4–10.5)
Chloride: 102 mmol/L (ref 96–112)
Creatinine, Ser: 0.57 mg/dL (ref 0.50–1.10)
GFR calc Af Amer: >90 mL/min (ref >90)
GFR calc non Af Amer: >90 mL/min (ref >90)
Glucose, Bld: 87 mg/dL (ref 70–99)
Potassium: 3.9 mmol/L (ref 3.5–5.1)
Sodium: 134 mmol/L — ABNORMAL LOW (ref 135–145)

## 2014-12-25 LAB — CBC
HCT: 40.3 % (ref 36.0–46.0)
Hemoglobin: 13.5 g/dL (ref 12.0–15.0)
MCH: 28.6 pg (ref 26.0–34.0)
MCHC: 33.5 g/dL (ref 30.0–36.0)
MCV: 85.4 fL (ref 78.0–100.0)
Platelets: 274 10*3/uL (ref 150–400)
RBC: 4.72 MIL/uL (ref 3.87–5.11)
RDW: 14.2 % (ref 11.5–15.5)
WBC: 11.9 10*3/uL — ABNORMAL HIGH (ref 4.0–10.5)

## 2014-12-25 LAB — ABO/RH: ABO/RH(D): A POS

## 2014-12-25 MED ORDER — METOPROLOL SUCCINATE ER 25 MG PO TB24: 25.0000 mg | ORAL_TABLET | Freq: Every day | ORAL | Status: DC

## 2014-12-25 NOTE — Telephone Encounter (Signed)
She is taking 25mg  daily  I have corrected and sent in new rx

## 2014-12-25 NOTE — H&P (Signed)
NAMLeory Kennedy:  Poulson, Jelisha               ACCOUNT NO.:  [id number]  MEDICAL RECORD NO.:  [id number]  LOCATION:  PERIO                         FACILITY:  [facility]  PHYSICIAN:  Lenoard Adenichard J. Maximiliano Cromartie, M.D.DATE OF BIRTH:  1981-06-25  DATE OF ADMISSION:  11/23/2014 DATE OF DISCHARGE:                             HISTORY & PHYSICAL   CHIEF COMPLAINT:  Persistent breech.  HISTORY OF PRESENT ILLNESS:  A 34 year old female, G1, P0, at 2740 weeks' gestation for primary C-section due to persistent breech.  MEDICATIONS:  Flexeril, Glucophage, Unisom, Vistaril, metoprolol, probiotic, and prenatal vitamins.  ALLERGIES:  Zithromax, latex, Augmentin  Nonsmoker,nondrinker.  She denies domestic or physical violence.  SOCIAL HISTORY:  Noncontributory.  PAST SURGICAL HISTORY:  Remarkable for laparoscopy in 2015, tonsillectomy in 2000, tear duct surgery in 1984.  PHYSICAL EXAMINATION:  GENERAL:  She is a well-developed, well- nourished, white female; in no acute distress. HEENT:  Normal. NECK:  Supple.  Full range of motion. LUNGS:  Clear. HEART:  Regular rate and rhythm. ABDOMEN:  Soft, gravid, nontender.  Estimated fetal weight 7.5lbs pounds.  Cervix is closed, 70% breech, -2. EXTREMITIES:  There are no cords. NEUROLOGIC:  Nonfocal. SKIN:  Intact.  IMPRESSION:  Persistent breech presentation for primary cesarean section.  PLAN:  Proceed with low-segment transverse cesarean section.  Risks of anesthesia, infection, bleeding, injury to surrounding organs, possible need for repair was discussed.  Delayed versus immediate complications to include bowel and bladder injury noted.  The patient acknowledges and wishes to proceed.     Lenoard Adenichard J. Yerania Chamorro, M.D.     RJT/MEDQ  D:  12/25/2014  T:  12/25/2014  Job:  [id number]

## 2014-12-25 NOTE — Telephone Encounter (Signed)
Dr. Ladona Ridgelaylor patient. Forwarding to his nurse to address.

## 2014-12-25 NOTE — Patient Instructions (Addendum)
   Your procedure is scheduled on:  Tuesday, Feb 2  [facility] 7:30 AM Pick up the phone at the desk and dial [phone] and inform us of your arrival.  Please call this number if you have any problems the morning of surgery: [phone]  Remember: Do not eat or drink after midnight: Tonight Take these medicines the morning of surgery with a SIP OF WATER:  Bring albuterol inhaler with you on day of surgery.  Do not take diabetes medication tonight and on day of surgery. We will check you sugar level upon arrival to Short Stay Dept.  Do not wear jewelry, make-up, or FINGER nail polish No metal in your hair or on your body. Do not wear lotions, powders, perfumes.  You may wear deodorant.  Do not bring valuables to the hospital. Contacts, dentures or bridgework may not be worn into surgery.  Leave suitcase in the car. After Surgery it may be brought to your room. For patients being admitted to the hospital, checkout time is 11:00am the day of discharge.  Home with husband Ree KidaJack cell [phone]

## 2014-12-25 NOTE — Telephone Encounter (Signed)
New message  Pt called states that she is confused on the dosage of metoprolol. Please call back to discuss.

## 2014-12-26 ENCOUNTER — Encounter (HOSPITAL_COMMUNITY)
Admission: RE | Disposition: A | Discharge status: Home / Self Care | Payer: Self-pay | Source: Ambulatory Visit | Attending: Obstetrics and Gynecology

## 2014-12-26 ENCOUNTER — Inpatient Hospital Stay (HOSPITAL_COMMUNITY): Payer: BLUE CROSS/BLUE SHIELD | Admitting: Anesthesiology

## 2014-12-26 ENCOUNTER — Inpatient Hospital Stay (HOSPITAL_COMMUNITY): Payer: BLUE CROSS/BLUE SHIELD

## 2014-12-26 ENCOUNTER — Inpatient Hospital Stay (HOSPITAL_COMMUNITY)
Admission: RE | Admit: 2014-12-26 | Discharge: 2014-12-29 | DRG: 765 | Disposition: A | Discharge status: Home / Self Care | Payer: BLUE CROSS/BLUE SHIELD | Source: Ambulatory Visit | Attending: Obstetrics and Gynecology | Admitting: Obstetrics and Gynecology

## 2014-12-26 ENCOUNTER — Encounter (HOSPITAL_COMMUNITY): Payer: Self-pay | Admitting: Anesthesiology

## 2014-12-26 DIAGNOSIS — Z880 Allergy status to penicillin: Secondary | ICD-10-CM

## 2014-12-26 DIAGNOSIS — O99613 Diseases of the digestive system complicating pregnancy, third trimester: Secondary | ICD-10-CM | POA: Diagnosis present

## 2014-12-26 DIAGNOSIS — R000 Tachycardia, unspecified: Secondary | ICD-10-CM | POA: Diagnosis present

## 2014-12-26 DIAGNOSIS — R87612 Low grade squamous intraepithelial lesion on cytologic smear of cervix (LGSIL): Secondary | ICD-10-CM | POA: Diagnosis present

## 2014-12-26 DIAGNOSIS — O99343 Other mental disorders complicating pregnancy, third trimester: Secondary | ICD-10-CM | POA: Diagnosis present

## 2014-12-26 DIAGNOSIS — O9832 Other infections with a predominantly sexual mode of transmission complicating childbirth: Secondary | ICD-10-CM | POA: Diagnosis present

## 2014-12-26 DIAGNOSIS — O321XX0 Maternal care for breech presentation, not applicable or unspecified: Secondary | ICD-10-CM | POA: Diagnosis present

## 2014-12-26 DIAGNOSIS — O480 Post-term pregnancy: Secondary | ICD-10-CM | POA: Diagnosis present

## 2014-12-26 DIAGNOSIS — O9081 Anemia of the puerperium: Secondary | ICD-10-CM | POA: Diagnosis not present

## 2014-12-26 DIAGNOSIS — Z881 Allergy status to other antibiotic agents status: Secondary | ICD-10-CM

## 2014-12-26 DIAGNOSIS — E282 Polycystic ovarian syndrome: Secondary | ICD-10-CM | POA: Diagnosis present

## 2014-12-26 DIAGNOSIS — Z8611 Personal history of tuberculosis: Secondary | ICD-10-CM

## 2014-12-26 DIAGNOSIS — K589 Irritable bowel syndrome without diarrhea: Secondary | ICD-10-CM | POA: Diagnosis present

## 2014-12-26 DIAGNOSIS — J45909 Unspecified asthma, uncomplicated: Secondary | ICD-10-CM | POA: Diagnosis present

## 2014-12-26 DIAGNOSIS — F329 Major depressive disorder, single episode, unspecified: Secondary | ICD-10-CM | POA: Diagnosis present

## 2014-12-26 DIAGNOSIS — O99413 Diseases of the circulatory system complicating pregnancy, third trimester: Secondary | ICD-10-CM | POA: Diagnosis present

## 2014-12-26 DIAGNOSIS — O321XX1 Maternal care for breech presentation, fetus 1: Secondary | ICD-10-CM

## 2014-12-26 DIAGNOSIS — Z91040 Latex allergy status: Secondary | ICD-10-CM | POA: Diagnosis not present

## 2014-12-26 DIAGNOSIS — O99513 Diseases of the respiratory system complicating pregnancy, third trimester: Secondary | ICD-10-CM | POA: Diagnosis present

## 2014-12-26 DIAGNOSIS — O26893 Other specified pregnancy related conditions, third trimester: Secondary | ICD-10-CM | POA: Diagnosis present

## 2014-12-26 DIAGNOSIS — F419 Anxiety disorder, unspecified: Secondary | ICD-10-CM | POA: Diagnosis present

## 2014-12-26 DIAGNOSIS — O99824 Streptococcus B carrier state complicating childbirth: Secondary | ICD-10-CM | POA: Diagnosis present

## 2014-12-26 DIAGNOSIS — A630 Anogenital (venereal) warts: Secondary | ICD-10-CM | POA: Diagnosis present

## 2014-12-26 DIAGNOSIS — Z3A40 40 weeks gestation of pregnancy: Secondary | ICD-10-CM | POA: Diagnosis present

## 2014-12-26 DIAGNOSIS — Z3493 Encounter for supervision of normal pregnancy, unspecified, third trimester: Secondary | ICD-10-CM

## 2014-12-26 DIAGNOSIS — R58 Hemorrhage, not elsewhere classified: Secondary | ICD-10-CM

## 2014-12-26 DIAGNOSIS — D62 Acute posthemorrhagic anemia: Secondary | ICD-10-CM | POA: Diagnosis not present

## 2014-12-26 HISTORY — PX: DILATION AND EVACUATION: SHX1459

## 2014-12-26 LAB — CBC
HCT: 30.9 % — ABNORMAL LOW (ref 36.0–46.0)
Hemoglobin: 10.5 g/dL — ABNORMAL LOW (ref 12.0–15.0)
MCH: 28.8 pg (ref 26.0–34.0)
MCHC: 34 g/dL (ref 30.0–36.0)
MCV: 84.9 fL (ref 78.0–100.0)
Platelets: 252 10*3/uL (ref 150–400)
RBC: 3.64 MIL/uL — ABNORMAL LOW (ref 3.87–5.11)
RDW: 14.3 % (ref 11.5–15.5)
WBC: 21.3 10*3/uL — ABNORMAL HIGH (ref 4.0–10.5)

## 2014-12-26 LAB — APTT: aPTT: 26 seconds (ref 24–37)

## 2014-12-26 LAB — PREPARE RBC (CROSSMATCH)

## 2014-12-26 LAB — FIBRINOGEN: Fibrinogen: 403 mg/dL (ref 204–475)

## 2014-12-26 LAB — PROTIME-INR
INR: 1.01 (ref 0.00–1.49)
Prothrombin Time: 13.4 seconds (ref 11.6–15.2)

## 2014-12-26 LAB — RPR: RPR Ser Ql: NONREACTIVE

## 2014-12-26 SURGERY — DILATION AND EVACUATION, UTERUS
Anesthesia: Monitor Anesthesia Care | Site: Uterus

## 2014-12-26 SURGERY — Surgical Case
Anesthesia: Spinal

## 2014-12-26 MED ORDER — ONDANSETRON HCL 4 MG/2ML IJ SOLN
INTRAMUSCULAR | Status: DC | PRN
  Administered 2014-12-26: 4 mg via INTRAVENOUS

## 2014-12-26 MED ORDER — SIMETHICONE 80 MG PO CHEW: 80.0000 mg | CHEWABLE_TABLET | ORAL | Status: DC | PRN

## 2014-12-26 MED ORDER — FENTANYL CITRATE 0.05 MG/ML IJ SOLN
INTRAMUSCULAR | Status: DC | PRN
  Administered 2014-12-26: 12.5 ug via INTRATHECAL

## 2014-12-26 MED ORDER — KETOROLAC TROMETHAMINE 30 MG/ML IJ SOLN: 30.0000 mg | Freq: Four times a day (QID) | INTRAMUSCULAR | Status: DC | PRN

## 2014-12-26 MED ORDER — BUPIVACAINE HCL (PF) 0.25 % IJ SOLN
INTRAMUSCULAR | Status: AC
  Filled 2014-12-26: qty 30

## 2014-12-26 MED ORDER — CEFAZOLIN SODIUM-DEXTROSE 2-3 GM-% IV SOLR
2.0000 g | Freq: Once | INTRAVENOUS | Status: DC
  Filled 2014-12-26: qty 50

## 2014-12-26 MED ORDER — SIMETHICONE 80 MG PO CHEW
80.0000 mg | CHEWABLE_TABLET | ORAL | Status: DC
  Administered 2014-12-26 – 2014-12-28 (×3): 80 mg via ORAL
  Filled 2014-12-26 (×3): qty 1

## 2014-12-26 MED ORDER — CEFAZOLIN SODIUM-DEXTROSE 2-3 GM-% IV SOLR
INTRAVENOUS | Status: DC | PRN
  Administered 2014-12-26: 2 g via INTRAVENOUS

## 2014-12-26 MED ORDER — ONDANSETRON HCL 4 MG/2ML IJ SOLN
INTRAMUSCULAR | Status: AC
  Filled 2014-12-26: qty 2

## 2014-12-26 MED ORDER — DIPHENHYDRAMINE HCL 25 MG PO CAPS: 25.0000 mg | ORAL_CAPSULE | Freq: Four times a day (QID) | ORAL | Status: DC | PRN

## 2014-12-26 MED ORDER — MEPERIDINE HCL 25 MG/ML IJ SOLN
INTRAMUSCULAR | Status: AC
  Filled 2014-12-26: qty 1

## 2014-12-26 MED ORDER — LANOLIN HYDROUS EX OINT: 1.0000 "application " | TOPICAL_OINTMENT | CUTANEOUS | Status: DC | PRN

## 2014-12-26 MED ORDER — PROMETHAZINE HCL 25 MG/ML IJ SOLN: 6.2500 mg | INTRAMUSCULAR | Status: DC | PRN

## 2014-12-26 MED ORDER — OXYCODONE-ACETAMINOPHEN 5-325 MG PO TABS
2.0000 | ORAL_TABLET | ORAL | Status: DC | PRN
  Administered 2014-12-27 – 2014-12-28 (×7): 2 via ORAL
  Filled 2014-12-26 (×7): qty 2

## 2014-12-26 MED ORDER — BUPIVACAINE HCL (PF) 0.25 % IJ SOLN
INTRAMUSCULAR | Status: AC
  Filled 2014-12-26: qty 10

## 2014-12-26 MED ORDER — NALOXONE HCL 0.4 MG/ML IJ SOLN: 0.4000 mg | INTRAMUSCULAR | Status: DC | PRN

## 2014-12-26 MED ORDER — SODIUM CHLORIDE 0.9 % IV SOLN
Freq: Once | INTRAVENOUS | Status: AC
  Administered 2014-12-26: 12:00:00 via INTRAVENOUS

## 2014-12-26 MED ORDER — SENNOSIDES-DOCUSATE SODIUM 8.6-50 MG PO TABS
2.0000 | ORAL_TABLET | ORAL | Status: DC
  Administered 2014-12-26 – 2014-12-28 (×3): 2 via ORAL
  Filled 2014-12-26 (×3): qty 2

## 2014-12-26 MED ORDER — NALBUPHINE HCL 10 MG/ML IJ SOLN: 5.0000 mg | Freq: Once | INTRAMUSCULAR | Status: AC | PRN

## 2014-12-26 MED ORDER — IBUPROFEN 600 MG PO TABS: 600.0000 mg | ORAL_TABLET | Freq: Four times a day (QID) | ORAL | Status: DC | PRN

## 2014-12-26 MED ORDER — MISOPROSTOL 200 MCG PO TABS
ORAL_TABLET | ORAL | Status: AC
  Administered 2014-12-26: 800 ug via RECTAL
  Filled 2014-12-26: qty 4

## 2014-12-26 MED ORDER — MEPERIDINE HCL 25 MG/ML IJ SOLN: 6.2500 mg | INTRAMUSCULAR | Status: DC | PRN

## 2014-12-26 MED ORDER — ALBUTEROL SULFATE (2.5 MG/3ML) 0.083% IN NEBU: 2.5000 mg | INHALATION_SOLUTION | Freq: Four times a day (QID) | RESPIRATORY_TRACT | Status: DC | PRN

## 2014-12-26 MED ORDER — PRENATAL MULTIVITAMIN CH
1.0000 | ORAL_TABLET | Freq: Every day | ORAL | Status: DC
  Administered 2014-12-27 – 2014-12-29 (×3): 1 via ORAL
  Filled 2014-12-26 (×3): qty 1

## 2014-12-26 MED ORDER — MORPHINE SULFATE (PF) 0.5 MG/ML IJ SOLN
INTRAMUSCULAR | Status: DC | PRN
  Administered 2014-12-26: .2 mg via EPIDURAL

## 2014-12-26 MED ORDER — METHYLERGONOVINE MALEATE 0.2 MG PO TABS: 0.2000 mg | ORAL_TABLET | ORAL | Status: DC | PRN

## 2014-12-26 MED ORDER — HYDROMORPHONE HCL 1 MG/ML IJ SOLN
0.2500 mg | INTRAMUSCULAR | Status: DC | PRN
  Administered 2014-12-26 (×3): 0.25 mg via INTRAVENOUS

## 2014-12-26 MED ORDER — DEXAMETHASONE SODIUM PHOSPHATE 4 MG/ML IJ SOLN
INTRAMUSCULAR | Status: DC | PRN
  Administered 2014-12-26: 10 mg via INTRAVENOUS

## 2014-12-26 MED ORDER — FENTANYL CITRATE 0.05 MG/ML IJ SOLN
INTRAMUSCULAR | Status: DC | PRN
  Administered 2014-12-26 (×2): 50 ug via INTRAVENOUS

## 2014-12-26 MED ORDER — CEFAZOLIN SODIUM-DEXTROSE 2-3 GM-% IV SOLR
INTRAVENOUS | Status: AC
  Filled 2014-12-26: qty 50

## 2014-12-26 MED ORDER — LACTATED RINGERS IV SOLN
INTRAVENOUS | Status: DC
  Administered 2014-12-26 (×6): via INTRAVENOUS

## 2014-12-26 MED ORDER — PHENYLEPHRINE 40 MCG/ML (10ML) SYRINGE FOR IV PUSH (FOR BLOOD PRESSURE SUPPORT)
PREFILLED_SYRINGE | INTRAVENOUS | Status: AC
  Filled 2014-12-26: qty 10

## 2014-12-26 MED ORDER — HYDROMORPHONE 0.3 MG/ML IV SOLN: INTRAVENOUS | Status: DC

## 2014-12-26 MED ORDER — NALOXONE HCL 1 MG/ML IJ SOLN: 1.0000 ug/kg/h | INTRAVENOUS | Status: DC | PRN

## 2014-12-26 MED ORDER — LACTATED RINGERS IV SOLN
INTRAVENOUS | Status: DC
  Administered 2014-12-26: 19:00:00 via INTRAVENOUS
  Administered 2014-12-27: 125 mL/h via INTRAVENOUS
  Administered 2014-12-27: 12:00:00 via INTRAVENOUS

## 2014-12-26 MED ORDER — OXYTOCIN 10 UNIT/ML IJ SOLN
40.0000 [IU] | INTRAMUSCULAR | Status: DC | PRN
  Administered 2014-12-26: 40 [IU] via INTRAVENOUS

## 2014-12-26 MED ORDER — METHYLERGONOVINE MALEATE 0.2 MG/ML IJ SOLN
INTRAMUSCULAR | Status: AC
  Filled 2014-12-26: qty 1

## 2014-12-26 MED ORDER — LIDOCAINE HCL (CARDIAC) 20 MG/ML IV SOLN
INTRAVENOUS | Status: AC
  Filled 2014-12-26: qty 5

## 2014-12-26 MED ORDER — NALBUPHINE HCL 10 MG/ML IJ SOLN: 5.0000 mg | INTRAMUSCULAR | Status: DC | PRN

## 2014-12-26 MED ORDER — DIPHENHYDRAMINE HCL 25 MG PO CAPS: 25.0000 mg | ORAL_CAPSULE | ORAL | Status: DC | PRN

## 2014-12-26 MED ORDER — SODIUM CHLORIDE 0.9 % IJ SOLN: 3.0000 mL | INTRAMUSCULAR | Status: DC | PRN

## 2014-12-26 MED ORDER — PROPOFOL 10 MG/ML IV BOLUS
INTRAVENOUS | Status: DC | PRN
  Administered 2014-12-26 (×3): 20 mg via INTRAVENOUS

## 2014-12-26 MED ORDER — BUPIVACAINE IN DEXTROSE 0.75-8.25 % IT SOLN
INTRATHECAL | Status: DC | PRN
  Administered 2014-12-26: 1.6 mL via INTRATHECAL

## 2014-12-26 MED ORDER — DIPHENHYDRAMINE HCL 50 MG/ML IJ SOLN: 12.5000 mg | INTRAMUSCULAR | Status: DC | PRN

## 2014-12-26 MED ORDER — MIDAZOLAM HCL 2 MG/2ML IJ SOLN
INTRAMUSCULAR | Status: DC | PRN
  Administered 2014-12-26 (×2): 1 mg via INTRAVENOUS

## 2014-12-26 MED ORDER — MEPERIDINE HCL 25 MG/ML IJ SOLN
INTRAMUSCULAR | Status: DC | PRN
  Administered 2014-12-26 (×2): 12.5 mg via INTRAVENOUS

## 2014-12-26 MED ORDER — ONDANSETRON HCL 4 MG/2ML IJ SOLN: 4.0000 mg | INTRAMUSCULAR | Status: DC | PRN

## 2014-12-26 MED ORDER — DIPHENHYDRAMINE HCL 50 MG/ML IJ SOLN
12.5000 mg | Freq: Four times a day (QID) | INTRAMUSCULAR | Status: DC | PRN
Start: 2014-12-26 — End: 2014-12-27

## 2014-12-26 MED ORDER — MEPERIDINE HCL 25 MG/ML IJ SOLN
6.2500 mg | INTRAMUSCULAR | Status: DC | PRN
  Administered 2014-12-26: 12.5 mg via INTRAVENOUS

## 2014-12-26 MED ORDER — SCOPOLAMINE 1 MG/3DAYS TD PT72
MEDICATED_PATCH | TRANSDERMAL | Status: AC
  Administered 2014-12-26: 1.5 mg via TRANSDERMAL
  Filled 2014-12-26: qty 1

## 2014-12-26 MED ORDER — SODIUM CHLORIDE 0.9 % IJ SOLN
9.0000 mL | INTRAMUSCULAR | Status: DC | PRN
  Administered 2014-12-27: 9 mL via INTRAVENOUS
  Filled 2014-12-26: qty 9

## 2014-12-26 MED ORDER — DIBUCAINE 1 % RE OINT: 1.0000 "application " | TOPICAL_OINTMENT | RECTAL | Status: DC | PRN

## 2014-12-26 MED ORDER — BUPIVACAINE LIPOSOME 1.3 % IJ SUSP
20.0000 mL | Freq: Once | INTRAMUSCULAR | Status: AC
  Administered 2014-12-26: 20 mL
  Filled 2014-12-26: qty 20

## 2014-12-26 MED ORDER — PROPOFOL 10 MG/ML IV BOLUS
INTRAVENOUS | Status: AC
  Filled 2014-12-26: qty 20

## 2014-12-26 MED ORDER — ONDANSETRON HCL 4 MG/2ML IJ SOLN: 4.0000 mg | Freq: Four times a day (QID) | INTRAMUSCULAR | Status: DC | PRN

## 2014-12-26 MED ORDER — MENTHOL 3 MG MT LOZG: 1.0000 | LOZENGE | OROMUCOSAL | Status: DC | PRN

## 2014-12-26 MED ORDER — CEFAZOLIN SODIUM-DEXTROSE 2-3 GM-% IV SOLR
2.0000 g | INTRAVENOUS | Status: AC
  Administered 2014-12-26: 2 g via INTRAVENOUS

## 2014-12-26 MED ORDER — HYDROMORPHONE HCL 1 MG/ML IJ SOLN
0.2500 mg | INTRAMUSCULAR | Status: DC | PRN
  Administered 2014-12-26: 0.25 mg via INTRAVENOUS

## 2014-12-26 MED ORDER — OXYTOCIN 10 UNIT/ML IJ SOLN
INTRAMUSCULAR | Status: AC
Start: 2014-12-26 — End: 2014-12-26
  Filled 2014-12-26: qty 4

## 2014-12-26 MED ORDER — FENTANYL CITRATE 0.05 MG/ML IJ SOLN
INTRAMUSCULAR | Status: AC
  Filled 2014-12-26: qty 2

## 2014-12-26 MED ORDER — METHYLERGONOVINE MALEATE 0.2 MG/ML IJ SOLN: 0.2000 mg | INTRAMUSCULAR | Status: DC | PRN

## 2014-12-26 MED ORDER — OXYTOCIN 40 UNITS IN LACTATED RINGERS INFUSION - SIMPLE MED: 62.5000 mL/h | INTRAVENOUS | Status: DC

## 2014-12-26 MED ORDER — METFORMIN HCL ER 500 MG PO TB24
500.0000 mg | ORAL_TABLET | Freq: Two times a day (BID) | ORAL | Status: DC
  Administered 2014-12-26 – 2014-12-27 (×2): 500 mg via ORAL
  Filled 2014-12-26 (×4): qty 1

## 2014-12-26 MED ORDER — MORPHINE SULFATE 0.5 MG/ML IJ SOLN
INTRAMUSCULAR | Status: AC
  Filled 2014-12-26: qty 10

## 2014-12-26 MED ORDER — BUPIVACAINE HCL (PF) 0.25 % IJ SOLN
INTRAMUSCULAR | Status: DC | PRN
  Administered 2014-12-26: 10 mL

## 2014-12-26 MED ORDER — ONDANSETRON HCL 4 MG PO TABS: 4.0000 mg | ORAL_TABLET | ORAL | Status: DC | PRN

## 2014-12-26 MED ORDER — TETANUS-DIPHTH-ACELL PERTUSSIS 5-2.5-18.5 LF-MCG/0.5 IM SUSP
0.5000 mL | Freq: Once | INTRAMUSCULAR | Status: DC
  Filled 2014-12-26: qty 0.5

## 2014-12-26 MED ORDER — PHENYLEPHRINE 8 MG IN D5W 100 ML (0.08MG/ML) PREMIX OPTIME
INJECTION | INTRAVENOUS | Status: AC
  Filled 2014-12-26: qty 100

## 2014-12-26 MED ORDER — PHENYLEPHRINE 8 MG IN D5W 100 ML (0.08MG/ML) PREMIX OPTIME
INJECTION | INTRAVENOUS | Status: DC | PRN
  Administered 2014-12-26: 80 ug/min via INTRAVENOUS

## 2014-12-26 MED ORDER — METHYLERGONOVINE MALEATE 0.2 MG/ML IJ SOLN
0.2000 mg | Freq: Once | INTRAMUSCULAR | Status: AC
  Administered 2014-12-26: 0.2 mg via INTRAMUSCULAR

## 2014-12-26 MED ORDER — PHENYLEPHRINE HCL 10 MG/ML IJ SOLN
INTRAMUSCULAR | Status: DC | PRN
  Administered 2014-12-26 (×2): 80 ug via INTRAVENOUS
  Administered 2014-12-26: 120 ug via INTRAVENOUS
  Administered 2014-12-26: 80 ug via INTRAVENOUS
  Administered 2014-12-26: 120 ug via INTRAVENOUS
  Administered 2014-12-26: 80 ug via INTRAVENOUS

## 2014-12-26 MED ORDER — SCOPOLAMINE 1 MG/3DAYS TD PT72
1.0000 | MEDICATED_PATCH | Freq: Once | TRANSDERMAL | Status: DC
  Administered 2014-12-26: 1.5 mg via TRANSDERMAL

## 2014-12-26 MED ORDER — DEXAMETHASONE SODIUM PHOSPHATE 10 MG/ML IJ SOLN
INTRAMUSCULAR | Status: AC
  Filled 2014-12-26: qty 1

## 2014-12-26 MED ORDER — IBUPROFEN 600 MG PO TABS
600.0000 mg | ORAL_TABLET | Freq: Four times a day (QID) | ORAL | Status: DC
  Administered 2014-12-26 – 2014-12-29 (×12): 600 mg via ORAL
  Filled 2014-12-26 (×12): qty 1

## 2014-12-26 MED ORDER — ZOLPIDEM TARTRATE 5 MG PO TABS: 5.0000 mg | ORAL_TABLET | Freq: Every evening | ORAL | Status: DC | PRN

## 2014-12-26 MED ORDER — ONDANSETRON HCL 4 MG/2ML IJ SOLN: 4.0000 mg | Freq: Three times a day (TID) | INTRAMUSCULAR | Status: DC | PRN

## 2014-12-26 MED ORDER — DIPHENHYDRAMINE HCL 12.5 MG/5ML PO ELIX
12.5000 mg | ORAL_SOLUTION | Freq: Four times a day (QID) | ORAL | Status: DC | PRN
  Filled 2014-12-26: qty 5

## 2014-12-26 MED ORDER — METOPROLOL SUCCINATE ER 25 MG PO TB24
25.0000 mg | ORAL_TABLET | Freq: Every day | ORAL | Status: DC
  Administered 2014-12-26: 25 mg via ORAL
  Filled 2014-12-26 (×3): qty 1

## 2014-12-26 MED ORDER — SIMETHICONE 80 MG PO CHEW
80.0000 mg | CHEWABLE_TABLET | Freq: Three times a day (TID) | ORAL | Status: DC
  Administered 2014-12-27 – 2014-12-29 (×7): 80 mg via ORAL
  Filled 2014-12-26 (×8): qty 1

## 2014-12-26 MED ORDER — OXYCODONE-ACETAMINOPHEN 5-325 MG PO TABS
1.0000 | ORAL_TABLET | ORAL | Status: DC | PRN
  Administered 2014-12-27 – 2014-12-29 (×5): 1 via ORAL
  Filled 2014-12-26 (×6): qty 1

## 2014-12-26 MED ORDER — HYDROMORPHONE HCL 1 MG/ML IJ SOLN
INTRAMUSCULAR | Status: AC
  Filled 2014-12-26: qty 1

## 2014-12-26 MED ORDER — CEFAZOLIN SODIUM-DEXTROSE 2-3 GM-% IV SOLR
2.0000 g | Freq: Three times a day (TID) | INTRAVENOUS | Status: DC
  Administered 2014-12-26 – 2014-12-27 (×3): 2 g via INTRAVENOUS
  Filled 2014-12-26 (×5): qty 50

## 2014-12-26 MED ORDER — WITCH HAZEL-GLYCERIN EX PADS: 1.0000 "application " | MEDICATED_PAD | CUTANEOUS | Status: DC | PRN

## 2014-12-26 MED ORDER — MIDAZOLAM HCL 2 MG/2ML IJ SOLN
INTRAMUSCULAR | Status: AC
  Filled 2014-12-26: qty 2

## 2014-12-26 SURGICAL SUPPLY — 39 items
ADH SKN CLS LQ APL DERMABOND (GAUZE/BANDAGES/DRESSINGS) ×1
BALLN POSTPARTUM SOS BAKRI (BALLOONS) ×2
BALLOON POSTPARTUM SOS BAKRI (BALLOONS) IMPLANT
CLAMP CORD UMBIL (MISCELLANEOUS) ×1 IMPLANT
CLOTH BEACON ORANGE TIMEOUT ST (SAFETY) ×2 IMPLANT
CONTAINER PREFILL 10% NBF 15ML (MISCELLANEOUS) IMPLANT
DERMABOND ADHESIVE PROPEN (GAUZE/BANDAGES/DRESSINGS) ×1
DERMABOND ADVANCED .7 DNX6 (GAUZE/BANDAGES/DRESSINGS) IMPLANT
DRAPE SHEET LG 3/4 BI-LAMINATE (DRAPES) ×1 IMPLANT
DRSG OPSITE POSTOP 4X10 (GAUZE/BANDAGES/DRESSINGS) ×2 IMPLANT
DURAPREP 26ML APPLICATOR (WOUND CARE) ×2 IMPLANT
ELECT REM PT RETURN 9FT ADLT (ELECTROSURGICAL) ×2
ELECTRODE REM PT RTRN 9FT ADLT (ELECTROSURGICAL) ×1 IMPLANT
EXTRACTOR VACUUM M CUP 4 TUBE (SUCTIONS) IMPLANT
GLOVE BIO SURGEON STRL SZ7.5 (GLOVE) ×2 IMPLANT
GOWN STRL REUS W/TWL LRG LVL3 (GOWN DISPOSABLE) ×4 IMPLANT
KIT ABG SYR 3ML LUER SLIP (SYRINGE) IMPLANT
NDL HYPO 25X1 1.5 SAFETY (NEEDLE) ×1 IMPLANT
NDL HYPO 25X5/8 SAFETYGLIDE (NEEDLE) IMPLANT
NDL SPNL 20GX3.5 QUINCKE YW (NEEDLE) IMPLANT
NEEDLE HYPO 25X1 1.5 SAFETY (NEEDLE) ×2 IMPLANT
NEEDLE HYPO 25X5/8 SAFETYGLIDE (NEEDLE) IMPLANT
NEEDLE SPNL 20GX3.5 QUINCKE YW (NEEDLE) IMPLANT
NS IRRIG 1000ML POUR BTL (IV SOLUTION) ×2 IMPLANT
PACK C SECTION WH (CUSTOM PROCEDURE TRAY) ×2 IMPLANT
STAPLER VISISTAT 35W (STAPLE) IMPLANT
SUT MNCRL 0 VIOLET CTX 36 (SUTURE) ×2 IMPLANT
SUT MNCRL AB 3-0 PS2 27 (SUTURE) IMPLANT
SUT MON AB 2-0 CT1 27 (SUTURE) ×2 IMPLANT
SUT MON AB-0 CT1 36 (SUTURE) ×4 IMPLANT
SUT MONOCRYL 0 CTX 36 (SUTURE) ×2
SUT PLAIN 0 NONE (SUTURE) IMPLANT
SUT PLAIN 2 0 (SUTURE)
SUT PLAIN 2 0 XLH (SUTURE) ×1 IMPLANT
SUT PLAIN ABS 2-0 CT1 27XMFL (SUTURE) IMPLANT
SYR 20CC LL (SYRINGE) IMPLANT
SYR CONTROL 10ML LL (SYRINGE) ×2 IMPLANT
TOWEL OR 17X24 6PK STRL BLUE (TOWEL DISPOSABLE) ×2 IMPLANT
TRAY FOLEY CATH 14FR (SET/KITS/TRAYS/PACK) ×2 IMPLANT

## 2014-12-26 SURGICAL SUPPLY — 20 items
ADAPTER VACURETTE TBG SET 14 (CANNULA) ×1 IMPLANT
BALLN POSTPARTUM SOS BAKRI (BALLOONS) ×2
BALLOON POSTPARTUM SOS BAKRI (BALLOONS) IMPLANT
CATH ROBINSON RED A/P 16FR (CATHETERS) ×2 IMPLANT
CLOTH BEACON ORANGE TIMEOUT ST (SAFETY) ×2 IMPLANT
DECANTER SPIKE VIAL GLASS SM (MISCELLANEOUS) ×2 IMPLANT
GLOVE BIO SURGEON STRL SZ7.5 (GLOVE) ×2 IMPLANT
GOWN STRL REUS W/TWL LRG LVL3 (GOWN DISPOSABLE) ×4 IMPLANT
KIT BERKELEY 1ST TRIMESTER 3/8 (MISCELLANEOUS) ×2 IMPLANT
NS IRRIG 1000ML POUR BTL (IV SOLUTION) ×2 IMPLANT
PACK VAGINAL MINOR WOMEN LF (CUSTOM PROCEDURE TRAY) ×2 IMPLANT
PAD OB MATERNITY 4.3X12.25 (PERSONAL CARE ITEMS) ×2 IMPLANT
PAD PREP 24X48 CUFFED NSTRL (MISCELLANEOUS) ×2 IMPLANT
SET BERKELEY SUCTION TUBING (SUCTIONS) ×2 IMPLANT
TOWEL OR 17X24 6PK STRL BLUE (TOWEL DISPOSABLE) ×4 IMPLANT
VACURETTE 10 RIGID CVD (CANNULA) IMPLANT
VACURETTE 14MM CVD 1/2 BASE (CANNULA) ×1 IMPLANT
VACURETTE 7MM CVD STRL WRAP (CANNULA) IMPLANT
VACURETTE 8 RIGID CVD (CANNULA) IMPLANT
VACURETTE 9 RIGID CVD (CANNULA) IMPLANT

## 2014-12-26 NOTE — Progress Notes (Signed)
Patient ID: Johna RolesSarah E Warsame, female   DOB: [birth year], 34 y.o.   MRN: [id number] Reassessed pt on 3 occasions for increased PP bleeding.  Estimated EBL per RN between OR and RR at 2000cc. Pt alert and oriented.   BP 111/79 mmHg  Pulse 108  Temp(Src) 97.7 F (36.5 C) (Oral)  Resp 18  SpO2 97%  LMP 02/09/2014  Breastfeeding? Unknown  VS stable at this time.   Exam noted about 100cc of clot in LUS which were manually evacuated and transrectal cytotec given (800mcg). Fundus firm at U-3.  Pt remains stable and weaning off Neo(pt reportedly with s/s hypovolemia after leaving OR) Will transfuse based on estimated blood loss per RN.  Will not place Bakri balloon at this time since pt appears clinically stable and bleeding continues to improve. Pt and husband notified of need for blood and inherent infectious risks of transfusion. Will continue to monitor bleeding and consider further need for intervention.

## 2014-12-26 NOTE — Op Note (Signed)
12/26/2014  2:34 PM  PATIENT:  Kim RolesSarah E Kennedy  34 y.o. female  PRE-OPERATIVE DIAGNOSIS:  post partum hemorrhage  POST-OPERATIVE DIAGNOSIS:  post partum hemorrhage  PROCEDURE:  Procedure(s): DILATATION AND EVACUATION with Bakri Balloon Placement  SURGEON:  Surgeon(s): Lenoard Adenichard J Verenis Nicosia, MD  ASSISTANTS: Fredric MareBailey, CNM, FACNM   ANESTHESIA:   local and IV sedation  ESTIMATED BLOOD LOSS: minimal   DRAINS: Urinary Catheter (Foley)   LOCAL MEDICATIONS USED:  NONE  SPECIMEN:  Source of Specimen:  D&E specimen, likely blood  DISPOSITION OF SPECIMEN:  PATHOLOGY  COUNTS:  YES  DICTATION #: dictated  PLAN OF CARE: transfer to AICU  PATIENT DISPOSITION:  PACU - hemodynamically stable.

## 2014-12-26 NOTE — Anesthesia Preprocedure Evaluation (Signed)
Anesthesia Evaluation  Patient identified by MRN, date of birth, ID band Patient awake    Reviewed: Allergy & Precautions, H&P , NPO status , Patient's Chart, lab work & pertinent test results  Airway Mallampati: I  TM Distance: >3 FB Neck ROM: full    Dental no notable dental hx.    Pulmonary  breath sounds clear to auscultation  Pulmonary exam normal       Cardiovascular  Tachycardia treated with Metoprolol   Neuro/Psych    GI/Hepatic negative GI ROS, Neg liver ROS,   Endo/Other  negative endocrine ROS  Renal/GU negative Renal ROS     Musculoskeletal   Abdominal Normal abdominal exam  (+)   Peds  Hematology negative hematology ROS (+)   Anesthesia Other Findings   Reproductive/Obstetrics (+) Pregnancy                             Anesthesia Physical  Anesthesia Plan  ASA: II  Anesthesia Plan: Spinal and MAC   Post-op Pain Management:    Induction:   Airway Management Planned:   Additional Equipment:   Intra-op Plan:   Post-operative Plan:   Informed Consent: I have reviewed the patients History and Physical, chart, labs and discussed the procedure including the risks, benefits and alternatives for the proposed anesthesia with the patient or authorized representative who has indicated his/her understanding and acceptance.     Plan Discussed with: CRNA and Surgeon  Anesthesia Plan Comments:         Anesthesia Quick Evaluation

## 2014-12-26 NOTE — Op Note (Signed)
Cesarean Section Procedure Note  Indications: malpresentation: frank breech  Pre-operative Diagnosis: 40 week 2 day pregnancy.  Post-operative Diagnosis: same ? 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 The risks, benefits, complications, treatment options, and expected outcomes were discussed with the patient.  The patient concurred with the proposed plan, giving informed consent. The risks of anesthesia, infection, bleeding and possible injury to other organs discussed. Injury to bowel, bladder, or ureter with possible need for repair discussed. Possible need for transfusion with secondary risks of hepatitis or HIV acquisition discussed. Post operative complications to include but not limited to DVT, PE and Pneumonia noted. The site of surgery properly noted/marked. The patient was taken to Operating Room # 9, identified as Johna RolesSarah E Martelle and the procedure verified as C-Section Delivery. A Time Out was held and the above information confirmed.  After induction of anesthesia, the patient was draped and prepped in the usual sterile manner. A Pfannenstiel incision was made and carried down through the subcutaneous tissue to the fascia. Fascial incision was made and extended transversely using Mayo scissors. The fascia was separated from the underlying rectus tissue superiorly and inferiorly. The peritoneum was identified and entered. Peritoneal incision was extended longitudinally. The utero-vesical peritoneal reflection was incised transversely and the bladder flap was bluntly freed from the lower uterine segment. A low transverse uterine incision(Kerr hysterotomy) was made. Delivered from frank breech presentation was a  female with Apgar scores of 8 at one minute and 9 at five  minutes. Bulb suctioning gently performed. Neonatal team in attendance.After the umbilical cord was clamped and cut cord blood was obtained for evaluation. The placenta was removed in a mostly intact but with difficulty. There was a focal area of adherence in left lateral fundal location of approximately 2-3cm. A small piece of placenta was removed from this location.  The uterus was curetted with a dry lap pack. Good hemostasis was noted.The uterine outline, tubes and ovaries appeared normal.  The uterus was removed from the abdomen and inspected. No evidence of increta or percreta. Minimal bleeding noted from placental site in utero.The uterine incision was closed with running locked sutures of 0 Monocryl x 2 layers. Hemostasis was observed. Lavage was carried out until clear.The parietal peritoneum was closed with a running 2-0 Monocryl suture. The fascia was then reapproximated with running sutures of 0 Monocryl. The skin was reapproximated with 3-0 monocryl after Hunter closure with 2-0 plain.  Instrument, sponge, and needle counts were correct prior the abdominal closure and at the conclusion of the case.   Findings: Homero FellersFrank breech, ? Focal accreta  Estimated Blood Loss:  500         Drains: foley                 Specimens: placenta to path                 Complications:  None; patient tolerated the procedure well.         Disposition: PACU - hemodynamically stable.         Condition: stable  Attending Attestation: I performed the procedure.

## 2014-12-26 NOTE — Transfer of Care (Signed)
Immediate Anesthesia Transfer of Care Note  Patient: Kim Kennedy  Procedure(s) Performed: Procedure(s): DILATATION AND EVACUATION with Bakri Balloon Placement (N/A)  Patient Location: PACU  Anesthesia Type:MAC  Level of Consciousness: sedated  Airway & Oxygen Therapy: Patient Spontanous Breathing  Post-op Assessment: Report given to RN  Post vital signs: Reviewed and stable  Last Vitals:  Filed Vitals:   12/26/14 1240  BP:   Pulse:   Temp: 37.5 C  Resp:     Complications: No apparent anesthesia complications

## 2014-12-26 NOTE — Anesthesia Procedure Notes (Signed)
Spinal Patient location during procedure: OR Start time: 12/26/2014 9:08 AM End time: 12/26/2014 9:10 AM Staffing Anesthesiologist: Leilani AbleHATCHETT, Yarethzi Branan Performed by: anesthesiologist  Preanesthetic Checklist Completed: patient identified, surgical consent, pre-op evaluation, timeout performed, IV checked, risks and benefits discussed and monitors and equipment checked Spinal Block Patient position: sitting Prep: site prepped and draped and DuraPrep Patient monitoring: heart rate, cardiac monitor, continuous pulse ox and blood pressure Approach: midline Location: L3-4 Injection technique: single-shot Needle Needle type: Pencan  Needle gauge: 24 G Needle length: 9 cm Needle insertion depth: 8 cm Assessment Sensory level: T4

## 2014-12-26 NOTE — Anesthesia Postprocedure Evaluation (Signed)
Anesthesia Post Note  Patient: Kim Kennedy  Procedure(s) Performed: Procedure(s) (LRB): Primary CESAREAN SECTION (N/A)  Anesthesia type: Spinal  Patient location: PACU  Post pain: Pain level controlled  Post assessment: Post-op Vital signs reviewed  Last Vitals:  Filed Vitals:   12/26/14 1240  BP:   Pulse:   Temp: 37.5 C  Resp:     Post vital signs: Reviewed  Level of consciousness: awake  Complications: No apparent anesthesia complications. Had some hypotension in the PACU related to placental issues. Transfused with 2uPRBC. Currently stable BP and HR.

## 2014-12-26 NOTE — Progress Notes (Signed)
Patient ID: [name], female   DOB: 1981/06/18, 34 y.o.   MRN: [id number] Bleeding continues to be well controlled.  Pain relief adequate.  BP 113/65 mmHg  Pulse 108  Temp(Src) 99.1 F (37.3 C) (Oral)  Resp 22  SpO2 95%  LMP 02/09/2014  Breastfeeding? Unknown  No active bleeding noted and minimal in catheter bag. Stable s/p PPH due to atony with indwelling intrauterine tamponade Continue IV ABX. Ck CBC in am. DC balloon in am. Transfer to AICU.

## 2014-12-26 NOTE — Anesthesia Postprocedure Evaluation (Signed)
Anesthesia Post Note  Patient: Kim RolesSarah E Kennedy  Procedure(s) Performed: Procedure(s) (LRB): DILATATION AND EVACUATION with Bakri Balloon Placement (N/A)  Anesthesia type: MAC  Patient location: PACU  Post pain: Pain level controlled  Post assessment: Post-op Vital signs reviewed  Last Vitals:  Filed Vitals:   12/26/14 1430  BP: 113/65  Pulse: 108  Temp:   Resp: 22    Post vital signs: Reviewed  Level of consciousness: sedated  Complications: No apparent anesthesia complications

## 2014-12-26 NOTE — Progress Notes (Signed)
Patient ID: [name], female   DOB: 15-Dec-1980, 34 y.o.   MRN: [id number] Patient seen and examined. Consent witnessed and signed. No changes noted. Update completed.

## 2014-12-26 NOTE — Anesthesia Preprocedure Evaluation (Signed)
Anesthesia Evaluation  Patient identified by MRN, date of birth, ID band Patient awake    Reviewed: Allergy & Precautions, H&P , NPO status , Patient's Chart, lab work & pertinent test results  Airway Mallampati: I  TM Distance: >3 FB Neck ROM: full    Dental no notable dental hx.    Pulmonary  breath sounds clear to auscultation  Pulmonary exam normal       Cardiovascular  Tachycardia treated with Metoprolol   Neuro/Psych    GI/Hepatic negative GI ROS, Neg liver ROS,   Endo/Other  negative endocrine ROS  Renal/GU negative Renal ROS     Musculoskeletal   Abdominal Normal abdominal exam  (+)   Peds  Hematology negative hematology ROS (+)   Anesthesia Other Findings   Reproductive/Obstetrics (+) Pregnancy                             Anesthesia Physical Anesthesia Plan  ASA: II  Anesthesia Plan: Spinal   Post-op Pain Management:    Induction:   Airway Management Planned:   Additional Equipment:   Intra-op Plan:   Post-operative Plan:   Informed Consent: I have reviewed the patients History and Physical, chart, labs and discussed the procedure including the risks, benefits and alternatives for the proposed anesthesia with the patient or authorized representative who has indicated his/her understanding and acceptance.     Plan Discussed with: CRNA and Surgeon  Anesthesia Plan Comments:         Anesthesia Quick Evaluation

## 2014-12-26 NOTE — Transfer of Care (Signed)
Immediate Anesthesia Transfer of Care Note  Patient: Kim Kennedy  Procedure(s) Performed: Procedure(s) with comments: Primary CESAREAN SECTION (N/A) - EDD: 12/23/14  Patient Location: PACU  Anesthesia Type:Spinal  Level of Consciousness: awake, alert  and oriented  Airway & Oxygen Therapy: Patient Spontanous Breathing  Post-op Assessment: Report given to RN and Post -op Vital signs reviewed and stable  Post vital signs: Reviewed and stable  Last Vitals:  Filed Vitals:   12/26/14 0748  BP: 111/79  Pulse: 108  Temp: 37.1 C  Resp: 18    Complications: No apparent anesthesia complications

## 2014-12-27 ENCOUNTER — Encounter (HOSPITAL_COMMUNITY): Payer: Self-pay | Admitting: Obstetrics and Gynecology

## 2014-12-27 LAB — CBC
HCT: 24.1 % — ABNORMAL LOW (ref 36.0–46.0)
Hemoglobin: 8.4 g/dL — ABNORMAL LOW (ref 12.0–15.0)
MCH: 29.4 pg (ref 26.0–34.0)
MCHC: 34.9 g/dL (ref 30.0–36.0)
MCV: 84.3 fL (ref 78.0–100.0)
Platelets: 177 10*3/uL (ref 150–400)
RBC: 2.86 MIL/uL — ABNORMAL LOW (ref 3.87–5.11)
RDW: 14.3 % (ref 11.5–15.5)
WBC: 17 10*3/uL — ABNORMAL HIGH (ref 4.0–10.5)

## 2014-12-27 MED ORDER — METFORMIN HCL ER 500 MG PO TB24
500.0000 mg | ORAL_TABLET | Freq: Two times a day (BID) | ORAL | Status: DC
  Administered 2014-12-27 – 2014-12-29 (×4): 500 mg via ORAL
  Filled 2014-12-27 (×4): qty 1

## 2014-12-27 MED ORDER — METOPROLOL SUCCINATE ER 25 MG PO TB24
25.0000 mg | ORAL_TABLET | Freq: Every day | ORAL | Status: DC
  Administered 2014-12-27 – 2014-12-28 (×2): 25 mg via ORAL
  Filled 2014-12-27 (×2): qty 1

## 2014-12-27 NOTE — Lactation Note (Addendum)
This note was copied from the chart of Kim Renaldo FiddlerSarah Kennedy. Lactation Consultation Note  Patient Name: Kim Kennedy ZOXWR'UToday's Date: 12/27/2014 Reason for consult: Initial assessment;Difficult latch;Breast/nipple pain Baby 25 hours of life. Mom reports that nipple is pinching when baby at breast. Mom able to hand express colostrum from both breast. Assisted mom to latch baby to left breast in football position. Baby latches deeply, suckling rhythmically with a few swallows noted. However, baby slips to tip of nipple and mom reports nipple "pinching." Moved baby to cross-cradle, and baby latched well again but then slipped to tip. Mom's nipple pinched and has visible compression stripe.   Examined baby mouth. Baby able to extend tongue past gumline and move laterally, but has limited elevation of tongue. When baby suckles LC's gloved finger, back of baby's tongue humps while suckling. Discussed findings with mom and enc her to discuss with baby's pediatrician especially if nipple pain and difficulty latching continues after her milk comes in.   Fitted mom with a #20 NS. Left #24 NS at bedside and discussed switching as needed. Baby latch to NS with a few suckles then fell asleep. Set mom up with a DEBP and provided teaching while mom pumped. After pumping for 15 minutes on Premie setting, baby given drops of colostrum from flanges. Plan is for mom to put baby to breast with cues--using NS, then post-pump for 15 minutes. Enc mom to hand express prior to and after pumping. Enc mom to give baby whatever she gets from post-pumping. Mom aware EBM can sit at bedside for 6-8 hours. Demonstrated to parents how to give baby EBM with syringe in NS.  Enc mom to attempt to latch baby without shield especially as her milk comes in. Mom states that she has her own personal pump at home. Discussed with mom that NS is a temporary device. Discussed with mom the need to post-pump in order to protect her supply as well. Mom  aware of OP/BFSG. Discussed assessment, interventions, and plan with patient's RN, Kim Evansatherine.  Maternal Data Has patient been taught Hand Expression?: Yes Does the patient have breastfeeding experience prior to this delivery?: No  Feeding Feeding Type: Breast Fed Length of feed: 10 min  LATCH Score/Interventions Latch: Repeated attempts needed to sustain latch, nipple held in mouth throughout feeding, stimulation needed to elicit sucking reflex. Intervention(s): Adjust position;Assist with latch;Breast compression  Audible Swallowing: A few with stimulation Intervention(s): Hand expression  Type of Nipple: Everted at rest and after stimulation (nipples, short shaft.)  Comfort (Breast/Nipple): Filling, red/small blisters or bruises, mild/mod discomfort  Problem noted: Mild/Moderate discomfort Interventions (Mild/moderate discomfort): Hand expression  Hold (Positioning): Assistance needed to correctly position infant at breast and maintain latch. Intervention(s): Breastfeeding basics reviewed;Support Pillows;Skin to skin;Position options  LATCH Score: 6  Lactation Tools Discussed/Used Tools: Nipple Kim CarnesShields;Pump Nipple shield size: 20 Breast pump type: Double-Electric Breast Pump Pump Review: Setup, frequency, and cleaning Initiated by:: Kim Kennedy Date initiated:: 12/27/14   Consult Status Consult Status: Follow-up Date: 12/28/14 Follow-up type: In-patient    Kim OchsWILLIARD, Kim Kennedy 12/27/2014, 11:12 AM

## 2014-12-27 NOTE — Progress Notes (Addendum)
Interval Note: AICU  S: Feels well. Pain controlled with Percocet.  O: VSS. Foley to SD, clear. No lochia. Fundus firm u/2.  A: S/p CS, PPH post Bakri d/c  P: PPD #0     Continue watch bleeding closely.     Continue routine postpartum care.

## 2014-12-27 NOTE — Progress Notes (Signed)
CSW acknowledges consult for maternal history of bipolar and anxiety.  CSW will wait to complete assessment until MOB is transferred from AICU.    Laketa Cathleen Yagi LCSW Clinical Social Worker [phone] 

## 2014-12-27 NOTE — Addendum Note (Signed)
Addendum  created 12/27/14 1304 by Yolonda KidaAlison L Jezabella Schriever, CRNA   Modules edited: Notes Section   Notes Section:  File: [id number]

## 2014-12-27 NOTE — Progress Notes (Signed)
POD # 1/AICU Day 1  Subjective: Pt reports feeling ok, tired/ Pain controlled with Percocet Tolerating po/ Foley in place/ Bakri in place /No n/v/ Flatus absent No SOB or dizziness Activity: BR Bleeding is light Newborn info:  Information for the patient's newborn:  Kim Kennedy, Kim Kennedy [[id number]][[id number]]  female  Feeding: breast   Objective:  VS:  Filed Vitals:   12/27/14 0400 12/27/14 0553 12/27/14 0600 12/27/14 0814  BP: 101/57  106/60 110/61  Pulse: 85 86 83 81  Temp:   97.9 F (36.6 C) 98 F (36.7 C)  TempSrc:   Oral Oral  Resp: 18  18 20   Height:      Weight:      SpO2: 98% 93% 97%      I&O: Intake/Output      02/02 0701 - 02/03 0700 02/03 0701 - 02/04 0700   P.O. 2350 100   I.V. (mL/kg) 7129.2 (75.9) 258.3 (2.8)   Blood 670    IV Piggyback 100 0   Total Intake(mL/kg) 10249.2 (109.2) 358.3 (3.8)   Urine (mL/kg/hr) 2750 300 (1)   Drains 64 2 (0)   Blood 1330    Total Output 4144 302   Net +6105.2 +56.3           Recent Labs  12/26/14 1110 12/27/14 0532  WBC 21.3* 17.0*  HGB 10.5* 8.4*  HCT 30.9* 24.1*  PLT 252 177    Blood type: --/--/A POS, A POS (02/01 1100) Rubella: Immune (07/02 0000)    Physical Exam:  General: alert and cooperative CV: Regular rate and rhythm Resp: CTA bilaterally Abdomen: soft, nontender, normal bowel sounds Incision: healing well, no drainage, no erythema, no hernia, no seroma, no swelling, well approximated, honeycomb dsg c/d/i Uterine Fundus: firm, below umbilicus, nontender Lochia: minimal, Bakri and vag packing in place GU: foley to SD, clear, abundant Ext: edema trace BLE and Homans sign is negative, no sign of DVT    Assessment: POD # 1/ G1P1000/ S/P C/Section d/t breech  PPH, Bakri -stable S/p transfusion 2 units PRBCs ABL anemia-stable, asymptomatic  Plan: Vaginal packing removed, Bakri deflated fully and removed, no active bleeding noted Monitor bleeding closely Continue routine post op orders Dr. Billy Coastaavon  updated with A/P   Signed: Donette Kennedy, Kim Kennedy, N, MSN, CNM 12/27/2014, 10:05 AM

## 2014-12-27 NOTE — Addendum Note (Signed)
Addendum  created 12/27/14 0836 by Earmon PhoenixValerie P Pink Maye, CRNA   Modules edited: Notes Section   Notes Section:  File: [id number]

## 2014-12-27 NOTE — Anesthesia Postprocedure Evaluation (Signed)
  Anesthesia Post-op Note  Patient: Kim Kennedy  Procedure(s) Performed: Procedure(s): DILATATION AND EVACUATION with Bakri Balloon Placement (N/A)  Patient Location: A-ICU  Anesthesia Type:MAC  Level of Consciousness: awake, alert , oriented and patient cooperative  Airway and Oxygen Therapy: Patient Spontanous Breathing  Post-op Pain: none  Post-op Assessment: Post-op Vital signs reviewed, Patient's Cardiovascular Status Stable, Respiratory Function Stable, Patent Airway and No signs of Nausea or vomiting  Post-op Vital Signs: Reviewed and stable  Last Vitals:  Filed Vitals:   12/27/14 1244  BP:   Pulse:   Temp:   Resp: 20    Complications: No apparent anesthesia complications

## 2014-12-27 NOTE — Op Note (Signed)
Kim Kennedy:  [name], [name]               ACCOUNT NO.:  [id number]  MEDICAL RECORD NO.:  [id number]  LOCATION:  9374                          FACILITY:  [facility]  PHYSICIAN:  Lenoard Adenichard J. Miqueas Whilden, M.D.DATE OF BIRTH:  1981-01-21  DATE OF PROCEDURE: DATE OF DISCHARGE:                              OPERATIVE REPORT   PREOPERATIVE DIAGNOSIS:  Reported postpartum hemorrhage.  POSTOPERATIVE DIAGNOSIS:  Reported postpartum hemorrhage.  PROCEDURE:  Suction D and E with intrauterine tamponade with intrauterine Bakri balloon placement under ultrasound guidance.  SURGEON:  Lenoard Adenichard J. Owain Eckerman, M.D.  ASSISTANT:  Dr. Fredric MareBailey.  ANESTHESIA:  IV sedation.  ESTIMATED BLOOD LOSS:  Minimal.  COMPLICATIONS:  None.  The patient recovered in good condition.  DRAINS:  Foley.  BRIEF OPERATIVE NOTE:  After being apprised of the need for intrauterine balloon placement due to continued postpartum bleeding and nonresponsive per RN report to uterotonics including IV Pitocin, methargen, and Cytotec, the patient was brought to the operating room for intrauterine balloon tamponade and D and E.  She is placed on dorsal lithotomy position.  Prepped and draped in usual sterile fashion.  A Foley catheter had been previously placed.  The patient was prepped and draped in the usual sterile fashion and exam under anesthesia reveals an 18- week well contracted uterus and no adnexal masses.  Ultrasound placement upon inspection reveals a thin endometrium with no evidence of intrauterine products of conception.  At this time, cervix is being visualized and there is minimal blood at the external os and suction curettage reveals no tissue.  A repeat visualization by ultrasound reveals an empty endometrial cavity and minimal bleeding was noted, however, due to reports of excessive bleeding, the Bakri balloon was placed under ultrasound guidance.  250 mL instilled in the balloon without difficulty.  Packing with Kerlix packing was  placed.  At the end of the procedure, good fundal placement of the balloon was noted.  The patient has been hemodynamically stable throughout the procedure.  She tolerated the procedure well.  She is awakened and transferred to recovery room in good condition.     Lenoard Adenichard J. Yzabelle Calles, M.D.     RJT/MEDQ  D:  12/26/2014  T:  12/27/2014  Job:  [id number]

## 2014-12-27 NOTE — Anesthesia Postprocedure Evaluation (Signed)
  Anesthesia Post-op Note  Patient: Kim RolesSarah E Kennedy  Procedure(s) Performed: Procedure(s) with comments: Primary CESAREAN SECTION (N/A) - EDD: 12/23/14  Patient Location: ICU  Anesthesia Type:Regional  Level of Consciousness: awake, alert , oriented and patient cooperative  Airway and Oxygen Therapy: Patient Spontanous Breathing  Post-op Pain: mild  Post-op Assessment: Patient's Cardiovascular Status Stable, Respiratory Function Stable, No headache, No backache, No residual numbness and No residual motor weakness  Post-op Vital Signs: stable  Last Vitals:  Filed Vitals:   12/27/14 0814  BP: 110/61  Pulse: 81  Temp: 36.7 C  Resp: 20    Complications: No apparent anesthesia complications

## 2014-12-27 NOTE — Progress Notes (Signed)
POD # 1/AICU Day 1  Subjective: Pt reports feeling well/ Pain controlled with Motrin and Percocet Tolerating po/ Foley d/c'd and voiding without problems/ No n/v/ Flatus present Activity: up with assistance, no SOB or dizziness Bleeding is spotting   Objective:  VS:  Filed Vitals:   12/27/14 1648 12/27/14 1649 12/27/14 1650 12/27/14 1651  BP:  108/64  101/55  Pulse: 117 102 95 112  Temp:      TempSrc:      Resp:  18  18  Height:      Weight:      SpO2:         I&O: Intake/Output      02/03 0701 - 02/04 0700   P.O. 1150   I.V. (mL/kg) 1506.3 (16)   Blood    IV Piggyback 50   Total Intake(mL/kg) 2706.3 (28.8)   Urine (mL/kg/hr) 2400 (2.1)   Drains 2 (0)   Blood    Total Output 2402   Net +304.3          Recent Labs  12/26/14 1110 12/27/14 0532  WBC 21.3* 17.0*  HGB 10.5* 8.4*  HCT 30.9* 24.1*  PLT 252 177    Blood type: --/--/A POS, A POS (02/01 1100) Rubella: Immune (07/02 0000)    Physical Exam:  General: alert and cooperative Incision: healing well, no drainage, no erythema, no hernia, no seroma, no swelling, well approximated Uterine Fundus: firm, below umbilicus, nontender Lochia: minimal Ext:    Assessment: POD # 1/ G1P1000/ S/P C/Section d/t breech  PPH-s/p Bakri-stable   Plan: Stable for transfer to postpartum unit CBC in am Continue routine post op orders   Signed: Donette LarryBHAMBRI, Laquilla Dault, Dorris CarnesN, MSN, CNM 12/27/2014, 7:19 PM

## 2014-12-28 LAB — CBC
HCT: 21.8 % — ABNORMAL LOW (ref 36.0–46.0)
Hemoglobin: 7.4 g/dL — ABNORMAL LOW (ref 12.0–15.0)
MCH: 29.2 pg (ref 26.0–34.0)
MCHC: 33.9 g/dL (ref 30.0–36.0)
MCV: 86.2 fL (ref 78.0–100.0)
Platelets: 180 10*3/uL (ref 150–400)
RBC: 2.53 MIL/uL — ABNORMAL LOW (ref 3.87–5.11)
RDW: 14.9 % (ref 11.5–15.5)
WBC: 11.7 10*3/uL — ABNORMAL HIGH (ref 4.0–10.5)

## 2014-12-28 NOTE — Lactation Note (Signed)
This note was copied from the chart of Kim Renaldo FiddlerSarah Fasching. Lactation Consultation Note Mom c/o severe pain during latching. Everted nipples are bruised. Has NS #20,#24. #24 will not go in baby's tiny mouth. #20 fits ok. Baby's tongue curls up on end, has limited mobility. Comfort gels given to mom. Hand expressed 1 ml colostrum gave to baby via curve tip syring during BF. In football position assisted mom in latching. C/o severe pain, chin tug a couple of times. Encouraged mom to use "C" hold when latching to firm breast. Noted improvement. Explained d/t sore nipples the 1 & 2nd suck may be tender but 3 & 4th. Shouldn't be. Heard active swallowing. Encouraged mom to gently massage breast during feeding at intervals. Noticed difference in breast before and after feedings. Mom tearful at first d/t tired, nipples hurt, and feeling overwhelmed. Gave options of pumping and bottle feeding if BF got to be to painful and if she wasn't enjoying it. Mom has PCOS and needs to post-pump. Mom has generalized edema, surprisingly none noted to areola and nipples. Also had large blood loss, noted mom's lips dry. Encouraged to drink fluids w/low or no sodium. Mom feels more comfortable after feeding. Patient Name: Kim Renaldo FiddlerSarah Kennedy ZOXWR'UToday's Date: 12/28/2014 Reason for consult: Follow-up assessment;Breast/nipple pain   Maternal Data    Feeding Feeding Type: Breast Milk Length of feed: 30 min  LATCH Score/Interventions Latch: Grasps breast easily, tongue down, lips flanged, rhythmical sucking. Intervention(s): Adjust position;Assist with latch;Breast massage;Breast compression  Audible Swallowing: A few with stimulation Intervention(s): Skin to skin;Hand expression;Alternate breast massage  Type of Nipple: Everted at rest and after stimulation  Comfort (Breast/Nipple): Filling, red/small blisters or bruises, mild/mod discomfort  Problem noted: Mild/Moderate discomfort Interventions (Mild/moderate discomfort):  Comfort gels;Breast shields;Hand expression;Hand massage  Hold (Positioning): Assistance needed to correctly position infant at breast and maintain latch. Intervention(s): Breastfeeding basics reviewed;Support Pillows;Position options;Skin to skin  LATCH Score: 7  Lactation Tools Discussed/Used Tools: Nipple Shields;Pump;Comfort gels Nipple shield size: 20 Breast pump type: Double-Electric Breast Pump   Consult Status Consult Status: Follow-up Date: 12/28/14 Follow-up type: In-patient    Charyl DancerCARVER, Alexea Blase G 12/28/2014, 5:17 AM

## 2014-12-28 NOTE — Progress Notes (Addendum)
Patient ID: Kim Kennedy, female   DOB: June 06, 1981, 34 y.o.   MRN: [id number] Subjective: POD# 1 / S/P PPH with Bakri Balloon Information for the patient's newborn:  Kim Kennedy, Kim Kennedy [[id number]][[id number]]  female   Reports feeling well and ready to take a shower Feeding: breast Patient reports tolerating PO.  Breast symptoms: using nipple shield Pain controlled with ibuprofen (OTC) and narcotic analgesics including Percocet Denies HA/SOB/C/P/N/V/dizziness. Flatus present. BM none. She reports vaginal bleeding as normal, without clots - "feel so much better after that scare with my bleeding".  She is ambulating, urinating without difficult.     Objective:   VS:  Filed Vitals:   12/27/14 2010 12/27/14 2130 12/28/14 0120 12/28/14 0600  BP: 124/64 113/59 118/66 112/62  Pulse: 97 100 103 92  Temp: 99.3 F (37.4 C) 99.3 F (37.4 C) 99.3 F (37.4 C) 98.1 F (36.7 C)  TempSrc: Oral Oral Oral Oral  Resp: 20 20 20 20   Height:      Weight:      SpO2: 98% 99%       Intake/Output Summary (Last 24 hours) at 12/28/14 1111 Last data filed at 12/28/14 0800  Gross per 24 hour  Intake 2406.25 ml  Output   2000 ml  Net 406.25 ml        Recent Labs  12/27/14 0532 12/28/14 0538  WBC 17.0* 11.7*  HGB 8.4* 7.4*  HCT 24.1* 21.8*  PLT 177 180     Blood type: A POS (02/01 1100)  Rubella: Immune (07/02 0000)     Physical Exam:  General: alert, cooperative and no distress Abdomen: soft, nontender, normal bowel sounds Incision: Tegaderm and Honeycomb dressing C/D/I - skin well-approximated with sutures Uterine Fundus: firm, 2 FB below umbilicus, nontender Lochia: minimal Ext: extremities normal, atraumatic, no cyanosis or edema, Homans sign is negative, no sign of DVT and no edema, redness or tenderness in the calves or thighs   Assessment/Plan: 34 y.o.   POD# 1.  S/P Primary Cesarean Delivery.  Indications: breech                Principal Problem:   Postpartum care following cesarean  delivery (2/2) Active Problems:   Cesarean delivery delivered Postpartum Hemorrhage - stable after Bakri balloon removal  Doing well, stable.               Regular diet as tolerated D/C Saline Lock May shower Ambulate Routine post-op care Anticipate D/C home tomorrow  Raelyn MoraDAWSON, Shelly Shoultz, M, MSN, CNM 12/28/2014, 11:11 AM

## 2014-12-28 NOTE — Progress Notes (Signed)
Clinical Social Work Department PSYCHOSOCIAL ASSESSMENT - MATERNAL/CHILD 12/28/2014  Patient:  Kim Kennedy, Kim Kennedy  Account Number:  [id number]  Admit Date:  12/26/2014  Kim Kennedy Name:   "Kim Kennedy"   Clinical Social Worker:  Lucita Ferrara, CLINICAL SOCIAL WORKER   Date/Time:  12/28/2014 09:30 AM  Date Referred:  12/26/2014   Referral source  Central Nursery     Referred reason  Depression/Anxiety   Other referral source:    I:  FAMILY / [place] legal guardian:  Kim Kennedy  Guardian - Name Guardian - Age Guardian - Address  Kim Kennedy [address] [place], [place] [address]  Kim Kennedy  same as above   Other household support members/support persons Other support:   MOB and FOB reported that both sets of their parents live within driving distance and are supportive. They also endorsed friends in [place] who are actively involved in their lives.    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  The FOB shared that he is employed. Both endorsed supportive employers.   Financial resources:  Multimedia programmer If [place]:  N/A  School / Grade:  N/A Maternity Glass blower/designer / Child Services Coordination / Early Interventions:   None reported  Cultural issues impacting care:   None reported    III  STRENGTHS Strengths  Adequate Resources  Home prepared for Child (including basic supplies)  Supportive family/friends   Strength comment:  MOB and FOB were easily engaged, are aware of their increased risk for developing postpartum depression, and were proactive to prepare for their increased risk.   IV  RISK FACTORS AND CURRENT PROBLEMS Current Problem:  YES   Risk Factor & Current Problem Patient Issue Family Issue Risk Factor / Current Problem Comment  Mental Illness Y N MOB presents with diagnosis of  anxiety. She is currently participating in therapy, and reports belief that symptoms are controlled.    V  SOCIAL WORK ASSESSMENT CSW met with the MOB due to maternal history of anxiety.  CSW also received referral for bipolar; however, MOB reported that she has never been diagnosed with bipolar and they are actively working to remove this from her chart since she believes this was entered in error.    MOB and FOB both actively participated in the assessment.  They were pleasant and easily engaged.  MOB and FOB were attending to and interacting with the baby, and both were noted to be smiling when they were interacting with the baby.  MOB did not present with any acute symptoms of anxiety, nor did she exhibit any anxious cognitive thought processes.  The MOB presents with insight and self-awareness, and also discussed their efforts to be pro-active in order to prepare for increased risk of developing postpartum depression/mood disorders.  The MOB and FOB presented with ability to identify their triggers for anxiety (when reality differs from expectation, when plans do not go accordingly), and acknowledged that they are likely to face numerous triggers in the postpartum period.  They shared that they are able to openly communicate and be receptive to taking it "one day at a time" and acknowledging that there are numerous factors that will be outside of their control.  The MOB shared that they have a strong support system, and she also stated that she has a positive therapeutic relationship with her therapist.  She stated  that due to these factors, she is not concerned about her transition to the postpartum period.  The MOB discussed the previous conversations she has had with her therapist that have assisted her to disengage from anxious thought processes, and she is aware that she has an increased risk for developing postpartum depression.  MOB presents as motivated to continue in therapy, and discussed her  intentions to follow up with her therapist via phone and in-person sessions. She stated that she prefers to transition to the postpartum period without assistance of medication, but that she is not opposed to medication if needed since she knows that if she is anxious/overwhelmed, it will not be "good or best" for the baby.  MOB also stated that the pediatrician is aware of her risk for postpartum depression, and has agreed to participate in her mental health care if needed.   MOB acknowledged additional questions, concerns, or needs at this time. They both expressed appreciation for the visit, and acknowledged ongoing CSW availability as needed.   No barriers to discharge.  VI SOCIAL WORK PLAN Social Work Therapist, art  No Further Intervention Required / No Barriers to Discharge   Type of pt/family education:   Postpartum depression   If child protective services report - county:  N/A If child protective services report - date:  N/A Information/referral to community resources comment:   No referrals needed at this time.   Other social work plan:   CSW to follow up as needed or upon family request.

## 2014-12-29 LAB — TYPE AND SCREEN
ABO/RH(D): A POS
Antibody Screen: NEGATIVE
Unit division: 0
Unit division: 0
Unit division: 0
Unit division: 0

## 2014-12-29 MED ORDER — OXYCODONE-ACETAMINOPHEN 5-325 MG PO TABS: 1.0000 | ORAL_TABLET | ORAL | Status: DC | PRN

## 2014-12-29 MED ORDER — POLYSACCHARIDE IRON COMPLEX 150 MG PO CAPS: 150.0000 mg | ORAL_CAPSULE | Freq: Two times a day (BID) | ORAL | Status: DC

## 2014-12-29 MED ORDER — IBUPROFEN 600 MG PO TABS: 600.0000 mg | ORAL_TABLET | Freq: Four times a day (QID) | ORAL | Status: DC | PRN

## 2014-12-29 MED ORDER — MAGNESIUM OXIDE 400 (241.3 MG) MG PO TABS
200.0000 mg | ORAL_TABLET | Freq: Every day | ORAL | Status: DC
Start: 2014-12-29 — End: 2015-06-26

## 2014-12-29 MED ORDER — MAGNESIUM OXIDE 400 (241.3 MG) MG PO TABS
200.0000 mg | ORAL_TABLET | Freq: Every day | ORAL | Status: DC
  Administered 2014-12-29: 200 mg via ORAL
  Filled 2014-12-29: qty 0.5

## 2014-12-29 MED ORDER — POLYSACCHARIDE IRON COMPLEX 150 MG PO CAPS
150.0000 mg | ORAL_CAPSULE | Freq: Two times a day (BID) | ORAL | Status: DC
  Administered 2014-12-29: 150 mg via ORAL
  Filled 2014-12-29: qty 1

## 2014-12-29 NOTE — Discharge Summary (Signed)
POSTOPERATIVE DISCHARGE SUMMARY:  Patient ID: Kim Kennedy MRN: [id number] DOB/AGE: [birth year] 34 y.o.  Admit date: 12/26/2014 Admission Diagnoses: 40.[redacted] weeks gestation, breech presentation   Discharge date: 12/29/2014 Discharge Diagnoses: S/P C/S on 12/26/14, postpartum hemorrhage, ABL anemia, s/p blood transfusion        Prenatal history: G1P1001  EDC: 12/23/2014, Alternate EDD Entry  Prenatal care at [facility] since [redacted] wks gestation. Primary provider: Dr. Billy Coast Prenatal course complicated by persistent breech-declined ECV, PCOS on Metformin, tachycardia-on metoprolol followed by cardiologist. AGA, normal AFI. Normal genetic screen.  Prenatal labs: ABO, Rh: --/--/A POS, A POS (02/01 1100) Antibody: NEG (02/01 1100) Rubella:   IMMUNE RPR: Non Reactive (02/01 1100)  HBsAg: Negative (07/02 0000)  HIV: Non-reactive (07/02 0000)  GBS:   POS GTT: 115-early, 123-3rd trimester  Medical / Surgical History :  Past medical history:  Past Medical History  Diagnosis Date  . Polycystic ovarian syndrome 2011    Borderline A1C  . 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 Inactive TB 2011    chest xray neg  . Anxiety     hx - no meds  . Fibromyalgia 2007    no meds  . Tachycardia     controlled with lopressor, no problems  . History of asthma   . IBS (irritable bowel syndrome)     diet controlled  . LGSIL of cervix of undetermined significance   . Nausea     patient denies  . Osteoarthritis   . Cramping affecting pregnancy, antepartum   . GBS (group B streptococcus) UTI complicating pregnancy   . Chest tightness     anxiety related  . Third trimester pregnancy at less than 36 weeks 11/22/2014  . Breech presentation 11/22/2014  . Dysrhythmia     Hx tachycardia - controlled with lopressor, no problems  . Asthma     prn -seasonal use of inhaler only - rarely use  . Depression     hx - no meds     Past surgical history:  Past Surgical History  Procedure  Laterality Date  . Tonsillectomy and adenoidectomy  2000  . Mandible fracture surgery  03/2001    x 7 jaw surgeries. TMJ  . Wisdom tooth extraction    . Nasal septum surgery  03/2001  . Laparoscopy N/A 02/27/2014    Procedure: LAPAROSCOPY DIAGNOSTIC;  Surgeon: Lenoard Aden, MD;  Location: [facility];  Service: Gynecology;  Laterality: N/A;  . Laparoscopic lysis of adhesions N/A 02/27/2014    Procedure: LAPAROSCOPIC LYSIS OF ADHESIONS;  Surgeon: Lenoard Aden, MD;  Location: [facility];  Service: Gynecology;  Laterality: N/A;  . Tear duct surgery      at 75 months old - right eye  . Colposcopy w/ biopsy / curettage    . Cesarean section N/A 12/26/2014    Procedure: Primary CESAREAN SECTION;  Surgeon: Lenoard Aden, MD;  Location: [facility];  Service: Obstetrics;  Laterality: N/A;  EDD: 12/23/14  . Dilation and evacuation N/A 12/26/2014    Procedure: DILATATION AND EVACUATION with Bakri Balloon Placement;  Surgeon: Lenoard Aden, MD;  Location: [facility];  Service: Gynecology;  Laterality: N/A;     Medications on Admission: No prescriptions prior to admission    Allergies: Erythromycin; Latex; Zithromax; Augmentin; and Penicillins   Intrapartum Course:  Admitted for scheduled CS under regional anesthesia.   Postpartum Course: Complicated by PPH in PACU, EBL 2000 mL, transfusion of 2 units PRBCs, suction D&E and  Bakri balloon placement, ICU admission. Bakri removed on POD #1, bleeding was stable, and transferred to floor for routine pp care.  Physical Exam:   VSS: Blood pressure 115/64, pulse 87, temperature 98.1 F (36.7 C), temperature source Oral, resp. rate 18, height 5\' 4"  (1.626 m), weight 93.895 kg (207 lb), last menstrual period 02/09/2014, SpO2 100 %, unknown if currently breastfeeding.  LABS:  Recent Labs  12/27/14 0532 12/28/14 0538  WBC 17.0* 11.7*  HGB 8.4* 7.4*  PLT 177 180    General: Alert and oriented x3 Heart: RRR Lungs: CTA bilaterally GI: soft, non-tender,  non-distended, BS x4 Lochia: small Uterus: firm below umbilicus Incision: well approximated; honeycomb dressing-no significant erythema, drainage, or edema Extremities: No edema, Homans neg   Newborn Data Live born female  Birth Weight: 7 lb 3 oz (3260 g) APGAR: 9, 9  See operative report for further details  Home with mother.  Discharge Instructions:  Wound Care: keep clean and dry / remove honeycomb POD 6 Postpartum Instructions: [facility] discharge booklet - instructions reviewed Medications:    Medication List    STOP taking these medications        acetaminophen 500 MG tablet  Commonly known as:  TYLENOL     doxylamine (Sleep) 25 MG tablet  Commonly known as:  UNISOM      TAKE these medications        albuterol 108 (90 BASE) MCG/ACT inhaler  Commonly known as:  PROVENTIL HFA;VENTOLIN HFA  Inhale 2 puffs into the lungs every 6 (six) hours as needed for wheezing or shortness of breath.     ibuprofen 600 MG tablet  Commonly known as:  ADVIL,MOTRIN  Take 1 tablet (600 mg total) by mouth every 6 (six) hours as needed for mild pain.     iron polysaccharides 150 MG capsule  Commonly known as:  NIFEREX  Take 1 capsule (150 mg total) by mouth 2 (two) times daily.     magnesium oxide 400 (241.3 MG) MG tablet  Commonly known as:  MAG-OX  Take 0.5 tablets (200 mg total) by mouth daily.     metFORMIN 500 MG 24 hr tablet  Commonly known as:  GLUCOPHAGE-XR  Take 1 tablet by mouth 2 (two) times daily.     metoprolol succinate 25 MG 24 hr tablet  Commonly known as:  TOPROL-XL  Take 1 tablet (25 mg total) by mouth daily.     oxyCODONE-acetaminophen 5-325 MG per tablet  Commonly known as:  PERCOCET/ROXICET  Take 1-2 tablets by mouth every 4 (four) hours as needed (for pain scale equal to or greater than 7).     prenatal multivitamin Tabs tablet  Take 1 tablet by mouth daily at 12 noon.            Follow-up Information    Follow up with Lenoard AdenAAVON,RICHARD J, MD.  Schedule an appointment as soon as possible for a visit in 2 weeks.   Specialty:  Obstetrics and Gynecology   Contact information:   [address] [place] [address] [phone]       Follow up with Lenoard AdenAAVON,RICHARD J, MD. Schedule an appointment as soon as possible for a visit in 6 weeks.   Specialty:  Obstetrics and Gynecology   Contact information:   [address] [place] [address] [phone]         Signed: Donette LarryBHAMBRI, Israel Werts, Dorris Carnes MSN, CNM 12/29/2014, 12:49 PM

## 2014-12-29 NOTE — Lactation Note (Signed)
This note was copied from the chart of Kim Kennedy. Lactation Consultation Note  Follow up visit made prior to discharge.  Mom states breasts are feeling full today.  She states baby is nursing well using the 20 mm nipple shield.  Mom is comfortable during feedings and transitional milk noted in shield after feedings.  Mom is post pumping about three times per day and will continue after discharge.  Mom is wearing comfort gels for sore nipples.  Reviewed discharge instructions and questions answered.  Outpatient lactation appointment scheduled for Thursday 01/04/15.  Encouraged to call with any concerns or questions.  Patient Name: Kim Kennedy [id number]'WToday's Date: 12/29/2014     Maternal Data    Feeding Feeding Type: Breast Fed Length of feed: 40 min  LATCH Score/Interventions Latch: Repeated attempts needed to sustain latch, nipple held in mouth throughout feeding, stimulation needed to elicit sucking reflex. Intervention(s): Assist with latch;Adjust position  Audible Swallowing: A few with stimulation  Type of Nipple: Everted at rest and after stimulation  Comfort (Breast/Nipple): Filling, red/small blisters or bruises, mild/mod discomfort  Problem noted: Mild/Moderate discomfort Interventions (Mild/moderate discomfort): Hand expression;Hand massage;Breast shields (nipple shield)  Hold (Positioning): No assistance needed to correctly position infant at breast.  LATCH Score: 7  Lactation Tools Discussed/Used     Consult Status      Huston FoleyMOULDEN, Hildreth Robart S 12/29/2014, 10:43 AM

## 2014-12-29 NOTE — Progress Notes (Signed)
POD # 3  Subjective: Pt reports feeling well, ready for discharge/ Pain controlled with Motrin and Percocet Tolerating po/Voiding without problems/ No n/v/ Flatus present No SOB or dizziness Activity: ad lib Bleeding is light Newborn info:  Information for the patient's newborn:  Lestine BoxSellers, Girl Kalayah [[id number]][[id number]]  female Feeding: breast   Objective: VS: VS:  Filed Vitals:   12/28/14 1935 12/28/14 2330 12/29/14 0330 12/29/14 0511  BP: 116/62 117/64 121/62 115/64  Pulse: 100 98 98 87  Temp: 98.8 F (37.1 C) 98.4 F (36.9 C) 98.4 F (36.9 C) 98.1 F (36.7 C)  TempSrc: Oral Oral Oral Oral  Resp: 18 18 18 18   Height:      Weight:      SpO2: 100% 100% 100%     I&O: Intake/Output      02/04 0701 - 02/05 0700 02/05 0701 - 02/06 0700   P.O. 1440    I.V. (mL/kg)     IV Piggyback     Total Intake(mL/kg) 1440 (15.3)    Urine (mL/kg/hr) 1900 (0.8)    Drains     Total Output 1900     Net -460            LABS:  Recent Labs  12/27/14 0532 12/28/14 0538  WBC 17.0* 11.7*  HGB 8.4* 7.4*  PLT 177 180                           Physical Exam:  General: alert and cooperative CV: Regular rate and rhythm Resp: CTA bilaterally Abdomen: soft, nontender, normal bowel sounds Incision: healing well, no drainage, no erythema, no hernia, no seroma, no swelling, well approximated, honeycomb dsg c/d/i Uterine Fundus: firm, below umbilicus, nontender Lochia: minimal Ext: edema trace BLE and Homans sign is negative, no sign of DVT    Assessment: POD # 3/ G1P1000/ S/P C/Section d/t breech PPH-stable after Bakri removed ABL anemia-stable, asymptomatic Doing well and stable for discharge home  Plan: Discharge home RX's: Ibuprofen 600mg  po Q 6 hrs prn pain #30 Refill x 1 Percocet 5/325 1 - 2 tabs po every 4 hrs prn pain #30 Refill x 0 Niferex 150mg  po BID #60 Refill x 1 Mag Oxide 200 mg po daily #30, refill x1 [facility] booklet given    Signed: Lawernce PittsBHAMBRI, Cranston Koors, N,  MSN, CNM 12/29/2014, 10:33 AM

## 2015-01-04 ENCOUNTER — Ambulatory Visit (HOSPITAL_COMMUNITY)
Admission: RE | Admit: 2015-01-04 | Discharge: 2015-01-04 | Disposition: A | Discharge status: Home / Self Care | Payer: BLUE CROSS/BLUE SHIELD | Source: Ambulatory Visit | Attending: Obstetrics and Gynecology | Admitting: Obstetrics and Gynecology

## 2015-01-04 NOTE — Lactation Note (Signed)
Lactation Consult  Mother'Kennedy reason for visit: check in Visit Type: Feeding assessment Appointment Notes:  20 mm nipple shield Consult:  Initial Lactation Consultant:  Kim FoleyMOULDEN, Kim Kennedy  ________________________________________________________________________   Baby'Kennedy Name: Kim BodoEvelyn Kennedy Date of Birth: 12/26/2014 Pediatrician: Kim GuardianPudlo Gender: female Gestational Age: 5756w3d (At Birth) Birth Weight: 7 lb 3 oz (3260 g) Weight at Discharge: Weight: 6 lb 10.4 oz (3015 g)Date [facility] Dis[facility]: [facility]   12/26/14 2335 12/28/14 0025 12/28/14 2340  Weight: 7 lb 1.2 oz (3209 g) 6 lb 12.6 oz (3080 g) 6 lb 10.4 oz (3015 g)   [facility]: 7-1on 01/01/15 Location:Pediatrician'Kennedy office Weight today:7-1.2       ________________________________________________________________________  Mother'Kennedy Name: Kim RolesSarah E Kennedy Type of delivery:  C/Kennedy Breastfeeding Experience:  FIRST TIME BABY Maternal Medical Conditions:  Polycystic ovarian syndrome, PIH Maternal Medications:  Metropol, metformin, iron, percocet, ibuprofen, iron  ________________________________________________________________________  Breastfeeding History (Post Discharge)  Frequency of breastfeeding:  8+ times/24 hours Duration of feeding:  30-40 minutes    Infant Intake and Output Assessment  Voids:  4-5 in 24 hrs.  Color:  Clear yellow Stools:  5-6 in 24 hrs.  Color:  Yellow  ________________________________________________________________________  Maternal Breast Assessment  Breast:  Full and Compressible Nipple:  Erect Pain level:  0 Pain interventions:  Bra  _______________________________________________________________________ Feeding Assessment/Evaluation  Mom and 9 day old here for feeding assessment.  Mom is breastfeeding using a 20 mm nipple shield and she reports feedings are going well.  She denies pain and reports and abundant  milk supply.  Observed baby feed for 20 minutes actively with multiple swallows.  Answered questions and praised for doing so well.  Breastfeeding support group and outpatient services encouraged prn.  Initial feeding assessment:  Infant'Kennedy oral assessment:  WNL  Positioning:  Cross cradle Left breast  LATCH documentation:  Latch:  2 = Grasps breast easily, tongue down, lips flanged, rhythmical sucking.  Audible swallowing:  2 = Spontaneous and intermittent  Type of nipple:  2 = Everted at rest and after stimulation  Comfort (Breast/Nipple):  2 = Soft / non-tender  Hold (Positioning):  2 = No assistance needed to correctly position infant at breast  LATCH score:  10  Attached assessment:  Deep  Lips flanged:  Yes.    Lips untucked:  No.  Suck assessment:  Nutritive  Tools:  Nipple shield 20 mm Instructed on use and cleaning of tool:  Yes.    Pre-feed weight:3210  g  (7 lb.1.2  oz.) Post-feed weight:3270   g (7 lb.3.4  oz.) Amount transferred: 60  ml    Total amount transferred: 60 ml Total supplement given:  0 ml

## 2015-01-05 ENCOUNTER — Encounter (HOSPITAL_COMMUNITY): Payer: Self-pay | Admitting: *Deleted

## 2015-01-18 ENCOUNTER — Ambulatory Visit (HOSPITAL_COMMUNITY)
Admission: RE | Admit: 2015-01-18 | Discharge: 2015-01-18 | Disposition: A | Discharge status: Home / Self Care | Payer: BLUE CROSS/BLUE SHIELD | Source: Ambulatory Visit | Attending: Obstetrics and Gynecology | Admitting: Obstetrics and Gynecology

## 2015-01-18 NOTE — Lactation Note (Signed)
Lactation Consult  Mother's reason for visit: feeding assessment Visit Type: initial consult Appointment Notes: Mother is concerned that Kim Kennedy has only gained 11 ounces in 23 days. She was seen by Cathlean SauerSr Clark yesterday and [name] weighed day 7-14. Dr Chestine Sporelark did mention supplementing with EBM. Advised mother to work on her supply. Mother has been using a #20 nipple shield since hospital  Stay. Mother states that her nipples were semi flat while in the hospitial.  Consult:  Initial Lactation Consultant:  Michel BickersKendrick, Shaquandra Galano McCoy  ________________________________________________________________________    ________________________________________________________________________  Mother's Name: Kim Kennedy Type of delivery:  C-Section Breech presentation Breastfeeding Experience: none Maternal Medical Conditions:  Polycystic ovarian syndrome Maternal Medications:  Metroprolol 25 mg, Metformin 500 mg Bid, Iron, Prenatal vits  ________________________________________________________________________  Breastfeeding History (Post Discharge)  Frequency of breastfeeding:  Every 2-3 hours Duration of feeding:  10-15 mins pre breast    Pumping  Type of pump:  Medela pump in style Frequency:  3 times daily Volume:  30ml  Infant Intake and Output Assessment  Voids:  8 in 24 hrs.  Color:  Clear yellow Stools:  2in 24 hrs.  Color:  Yellow  ________________________________________________________________________  Maternal Breast Assessment  Breast:  Filling Nipple:  Erect Pain level:  2 Pain interventions:  Bra  _______________________________________________________________________ Feeding Assessment/Evaluation:  Mother latched [name] on using a #20 nipple shield. Lips were very pursed. Observed that both upper and lower lips are blanched white. Mother was fit with a #24 nipple shield. #24 NS fits much better. Lips flange well with good depth. Mother taught better latch technique using  better    Positioning:  Cross cradle Right breast  LATCH documentation:  Latch:  2 = Grasps breast easily, tongue down, lips flanged, rhythmical sucking.  Audible swallowing:  2 = Spontaneous and intermittent  Type of nipple:  2 = Everted at rest and after stimulation  Comfort (Breast/Nipple):  1 = Filling, red/small blisters or bruises, mild/mod discomfort  Hold (Positioning):  1 = Assistance needed to correctly position infant at breast and maintain latch  LATCH score:  8  Attached assessment:  Deep  Lips flanged:  Yes.    Lips untucked:  Yes.    Suck assessment:  Nutritive  Tools:  Nipple shield 24 mm Instructed on use and cleaning of tool:  Yes.    Pre-feed weight:  3580 g  (7lb. 14.3oz.) Post-feed weight:3604   g ( 7lb.15.1 oz.) Amount transferred:  24 ml   Additional Feeding Assessment -   Infant's oral assessment:  Slight posterior tie  Positioning:  Cross cradle Left breast  LATCH documentation:  Latch:  2 = Grasps breast easily, tongue down, lips flanged, rhythmical sucking.  Audible swallowing:  2 = Spontaneous and intermittent  Type of nipple:  2 = Everted at rest and after stimulation  Comfort (Breast/Nipple):  1 = Filling, red/small blisters or bruises, mild/mod discomfort  Hold (Positioning):  1 = Assistance needed to correctly position infant at breast and maintain latch  LATCH score:  8  Attached assessment:  Shallow  Lips flanged:  Yes.    Lips untucked:  Yes.    Suck assessment:  Nutritive  Tools:  Nipple shield 24 mm Instructed on use and cleaning of tool:  Yes.    Pre-feed weight:  3604 g  (7 lb. 15.1 oz.) Post-feed weight:  3640g ( 8 lb. 0.2 oz.) Amount transferred: 36  ml    Total amount pumped post feed:  R 24  ml  L36  ml  Total amount transferred:  60 ml  Advised mother to use #24 nipple shield Use good breast support Bring infant onto breast quickly Flange lips well and support breast Use good breast compression  Continue  to post pump 4 times daily Supplement infant every other feeding at least 20-30 ml EBM Advised mother to use Mothers milk plus or a supplement to increase volume Mother to follow up with LC in one week for feeding assessment and weight check

## 2015-01-26 ENCOUNTER — Ambulatory Visit (HOSPITAL_COMMUNITY): Admission: RE | Admit: 2015-01-26 | Payer: BLUE CROSS/BLUE SHIELD | Source: Ambulatory Visit

## 2015-01-31 NOTE — Addendum Note (Signed)
Encounter addended by: Milda SmartAngus A Jadelin Eng, RN on: 01/31/2015 11:14 AM<BR>     Documentation filed: Inpatient Patient Education, Demographics Visit

## 2015-06-08 ENCOUNTER — Other Ambulatory Visit: Payer: Self-pay | Admitting: Internal Medicine

## 2015-06-26 ENCOUNTER — Encounter: Payer: Self-pay | Admitting: Internal Medicine

## 2015-06-26 ENCOUNTER — Ambulatory Visit (INDEPENDENT_AMBULATORY_CARE_PROVIDER_SITE_OTHER): Payer: BLUE CROSS/BLUE SHIELD | Admitting: Internal Medicine

## 2015-06-26 VITALS — BP 104/60 | HR 90 | Ht 64.0 in | Wt 187.2 lb

## 2015-06-26 DIAGNOSIS — R002 Palpitations: Secondary | ICD-10-CM

## 2015-06-26 NOTE — Assessment & Plan Note (Signed)
Her symptoms are well controlled. She will continue her beta blocker. I have encouraged the patient to try and wean herself off of her beta blocker as tolerated, particularly if she decides to again try to become pregnant.

## 2015-06-26 NOTE — Patient Instructions (Signed)
Medication Instructions:  Your physician recommends that you continue on your current medications as directed. Please refer to the Current Medication list given to you today.   Labwork: NONE  Testing/Procedures: NONE  Follow-Up: Follow up with Dr. Taylor as needed.  Any Other Special Instructions Will Be Listed Below (If Applicable).   

## 2015-06-26 NOTE — Progress Notes (Signed)
HPI Kim Kennedy returns today for followup. She is a pleasant 34 yo woman with a h/o palpitations and either sinus tachycardia or SVT, likely the former who has treated her palpitations successfully with beta blocker therapy. The patient has delivered a healthy baby despite being on beta blocker therapy during her pregnancy. She feels well except for mild postpartum depression. 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 She denies sob. She is not sure about whether she plans to get pregnant again.  Allergies  Allergen Reactions  . Erythromycin Anaphylaxis  . Latex Swelling and Rash    Swelling at contact site  . Zithromax [Azithromycin] Anaphylaxis and Other (See Comments)    Tightness in chest  . Augmentin [Amoxicillin-Pot Clavulanate] Diarrhea and Nausea And Vomiting  . Penicillins Diarrhea and Nausea And Vomiting     Current Outpatient Prescriptions  Medication Sig Dispense Refill  . albuterol (PROVENTIL HFA;VENTOLIN HFA) 108 (90 BASE) MCG/ACT inhaler Inhale 2 puffs into the lungs every 6 (six) hours as needed for wheezing or shortness of breath.    . metFORMIN (GLUCOPHAGE-XR) 500 MG 24 hr tablet Take 1 tablet by mouth 2 (two) times daily.    . metoprolol succinate (TOPROL-XL) 25 MG 24 hr tablet TAKE 1 TABLET DAILY 90 tablet 1  . Prenatal Vit-Fe Fumarate-FA (PRENATAL MULTIVITAMIN) TABS tablet Take 1 tablet by mouth daily at 12 noon.    . sertraline (ZOLOFT) 50 MG tablet Take 50 mg by mouth daily.  0   No current facility-administered medications for this visit.     Past Medical History  Diagnosis Date  . Polycystic ovarian syndrome 2011    Borderline A1C  . 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 Inactive TB 2011    chest xray neg  . Anxiety     hx - no meds  . Fibromyalgia 2007    no meds  . Tachycardia     controlled with lopressor, no problems  . History of asthma   . IBS (irritable bowel syndrome)     diet controlled  . LGSIL of  cervix of undetermined significance   . Nausea     patient denies  . Osteoarthritis   . Cramping affecting pregnancy, antepartum   . GBS (group B streptococcus) UTI complicating pregnancy   . Chest tightness     anxiety related  . Third trimester pregnancy at less than 36 weeks 11/22/2014  . Breech presentation 11/22/2014  . Dysrhythmia     Hx tachycardia - controlled with lopressor, no problems  . Asthma     prn -seasonal use of inhaler only - rarely use  . Depression     hx - no meds     ROS:   All systems reviewed and negative except as noted in the HPI.   Past Surgical History  Procedure Laterality Date  . Tonsillectomy and adenoidectomy  2000  . Mandible fracture surgery  03/2001    x 7 jaw surgeries. TMJ  . Wisdom tooth extraction    . Nasal septum surgery  03/2001  . Laparoscopy N/A 02/27/2014    Procedure: LAPAROSCOPY DIAGNOSTIC;  Surgeon: Lenoard Aden, MD;  Location: [facility];  Service: Gynecology;  Laterality: N/A;  . Laparoscopic lysis of adhesions N/A 02/27/2014    Procedure: LAPAROSCOPIC LYSIS OF ADHESIONS;  Surgeon: Lenoard Aden, MD;  Location: [facility];  Service: Gynecology;  Laterality: N/A;  . Tear duct surgery  at 18 months old - right eye  . Colposcopy w/ biopsy / curettage    . Cesarean section N/A 12/26/2014    Procedure: Primary CESAREAN SECTION;  Surgeon: Lenoard Aden, MD;  Location: [facility];  Service: Obstetrics;  Laterality: N/A;  EDD: 12/23/14  . Dilation and evacuation N/A 12/26/2014    Procedure: DILATATION AND EVACUATION with Bakri Balloon Placement;  Surgeon: Lenoard Aden, MD;  Location: [facility];  Service: Gynecology;  Laterality: N/A;     Family History  Problem Relation Age of Onset  . Fibromyalgia Mother   . Thrombocytopenia Mother     had spleen removed  . Hepatitis C Mother   . Osteoporosis Mother   . Anxiety disorder Mother   . Hyperlipidemia Father   . Narcolepsy Father   . Diabetes Paternal Uncle     DM  . Heart attack  Maternal Grandmother   . Heart attack Maternal Grandfather   . Heart attack Paternal Grandmother   . Osteoporosis Paternal Grandmother   . Heart attack Paternal Grandfather      History   Social History  . Marital Status: Married    Spouse Name: N/A  . Number of Children: N/A  . Years of Education: N/A   Occupational History  . Not on file.   Social History Main Topics  . Smoking status: Never Smoker   . Smokeless tobacco: Never Used  . Alcohol Use: 0.0 oz/week     Comment: 1 glass of wine/week but none with preganacy  . Drug Use: No  . Sexual Activity:    Partners: Male    Birth Control/ Protection: None   Other Topics Concern  . Not on file   Social History Narrative     BP 104/60 mmHg  Pulse 90  Ht  (1.626 m)  Wt 187 lb 3.2 oz (84.913 kg)  BMI 32.12 kg/m2  Physical Exam:  Well appearing NAD HEENT: Unremarkable Neck:  6 cm JVD, no thyromegally Back:  No CVA tenderness Lungs:  Clear with no wheezes HEART:  Regular rate rhythm, no murmurs, no rubs, no clicks Abd:  soft, positive bowel sounds, no organomegally, no rebound, no guarding Ext:  2 plus pulses, no edema, no cyanosis, no clubbing Skin:  No rashes no nodules Neuro:  CN II through XII intact, motor grossly intact  EKG - NSR at 90/min.   Assess/Plan:

## 2015-12-05 ENCOUNTER — Other Ambulatory Visit: Payer: Self-pay | Admitting: Internal Medicine

## 2016-09-01 ENCOUNTER — Other Ambulatory Visit: Payer: Self-pay | Admitting: Internal Medicine

## 2016-11-26 ENCOUNTER — Ambulatory Visit: Payer: BLUE CROSS/BLUE SHIELD | Admitting: Internal Medicine

## 2016-12-02 ENCOUNTER — Encounter: Payer: Self-pay | Admitting: Internal Medicine

## 2016-12-02 ENCOUNTER — Ambulatory Visit (INDEPENDENT_AMBULATORY_CARE_PROVIDER_SITE_OTHER): Payer: BLUE CROSS/BLUE SHIELD | Admitting: Internal Medicine

## 2016-12-02 ENCOUNTER — Encounter (INDEPENDENT_AMBULATORY_CARE_PROVIDER_SITE_OTHER): Payer: Self-pay

## 2016-12-02 VITALS — BP 122/82 | HR 108 | Ht 64.0 in | Wt 193.2 lb

## 2016-12-02 DIAGNOSIS — R002 Palpitations: Secondary | ICD-10-CM

## 2016-12-02 MED ORDER — METOPROLOL SUCCINATE ER 25 MG PO TB24: 25.0000 mg | ORAL_TABLET | Freq: Every day | ORAL | 3 refills | Status: DC

## 2016-12-02 NOTE — Patient Instructions (Signed)

## 2016-12-02 NOTE — Progress Notes (Signed)
HPI Kim Kennedy returns today for followup. She is a pleasant 36 yo woman with a h/o palpitations and either sinus tachycardia or SVT, likely the former who has treated her palpitations successfully with beta blocker therapy. The patient has delivered a healthy baby despite being on beta blocker therapy during her pregnancy. She feels well. 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 She denies sob.  Allergies  Allergen Reactions  . Erythromycin Anaphylaxis  . Latex Swelling and Rash    Swelling at contact site  . Zithromax [Azithromycin] Anaphylaxis and Other (See Comments)    Tightness in chest  . Augmentin [Amoxicillin-Pot Clavulanate] Diarrhea and Nausea And Vomiting  . Penicillins Diarrhea and Nausea And Vomiting     Current Outpatient Prescriptions  Medication Sig Dispense Refill  . albuterol (PROVENTIL HFA;VENTOLIN HFA) 108 (90 BASE) MCG/ACT inhaler Inhale 2 puffs into the lungs every 6 (six) hours as needed for wheezing or shortness of breath.    Marland Kitchen. HYDROcodone-acetaminophen (NORCO) 7.5-325 MG tablet Take 1 tablet by mouth every 6 (six) hours as needed for pain.    . metoprolol succinate (TOPROL-XL) 25 MG 24 hr tablet Take 25 mg by mouth daily.    . Multiple Vitamins-Minerals (MULTIVITAMIN ADULT PO) Take 1 tablet by mouth daily.    Marland Kitchen. QVAR 40 MCG/ACT inhaler Inhale 2 puffs into the lungs 2 (two) times daily as needed for shortness of breath or wheezing.    . sertraline (ZOLOFT) 50 MG tablet Take 25 mg by mouth daily.    . metFORMIN (GLUCOPHAGE-XR) 500 MG 24 hr tablet Take 1 tablet by mouth 2 (two) times daily.     No current facility-administered medications for this visit.      Past Medical History:  Diagnosis Date  . Anxiety    hx - no meds  . Asthma    prn -seasonal use of inhaler only - rarely use  . Breech presentation 11/22/2014  . Chest tightness    anxiety related  . Cramping affecting pregnancy, antepartum   . Depression      hx - no meds   . Dysrhythmia    Hx tachycardia - controlled with lopressor, no problems  . Fibromyalgia 2007   no meds  . GBS (group B streptococcus) UTI complicating pregnancy   . History of asthma   . 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 IBS (irritable bowel syndrome)    diet controlled  . Inactive TB 2011   chest xray neg  . LGSIL of cervix of undetermined significance   . Nausea    patient denies  . Osteoarthritis   . Polycystic ovarian syndrome 2011   Borderline A1C  . Tachycardia    controlled with lopressor, no problems  . Third trimester pregnancy at less than 36 weeks 11/22/2014    ROS:   All systems reviewed and negative except as noted in the HPI.   Past Surgical History:  Procedure Laterality Date  . CESAREAN SECTION N/A 12/26/2014   Procedure: Primary CESAREAN SECTION;  Surgeon: Lenoard Adenichard J Taavon, MD;  Location: [facility];  Service: Obstetrics;  Laterality: N/A;  EDD: 12/23/14  . COLPOSCOPY W/ BIOPSY / CURETTAGE    . DILATION AND EVACUATION N/A 12/26/2014   Procedure: DILATATION AND EVACUATION with Bakri Balloon Placement;  Surgeon: Lenoard Adenichard J Taavon, MD;  Location: [facility];  Service: Gynecology;  Laterality: N/A;  . LAPAROSCOPIC LYSIS OF ADHESIONS N/A 02/27/2014   Procedure: LAPAROSCOPIC  LYSIS OF ADHESIONS;  Surgeon: Lenoard Aden, MD;  Location: [facility];  Service: Gynecology;  Laterality: N/A;  . LAPAROSCOPY N/A 02/27/2014   Procedure: LAPAROSCOPY DIAGNOSTIC;  Surgeon: Lenoard Aden, MD;  Location: [facility];  Service: Gynecology;  Laterality: N/A;  . MANDIBLE FRACTURE SURGERY  03/2001   x 7 jaw surgeries. TMJ  . NASAL SEPTUM SURGERY  03/2001  . tear duct surgery     at 25 months old - right eye  . TONSILLECTOMY AND ADENOIDECTOMY  2000  . WISDOM TOOTH EXTRACTION       Family History  Problem Relation Age of Onset  . Fibromyalgia Mother   . Thrombocytopenia Mother     had spleen removed  . Hepatitis C Mother   . Osteoporosis Mother   . Anxiety disorder Mother    . Hyperlipidemia Father   . Narcolepsy Father   . Diabetes Paternal Uncle     DM  . Heart attack Maternal Grandmother   . Heart attack Maternal Grandfather   . Heart attack Paternal Grandmother   . Osteoporosis Paternal Grandmother   . Heart attack Paternal Grandfather      Social History   Social History  . Marital status: Married    Spouse name: N/A  . Number of children: N/A  . Years of education: N/A   Occupational History  . Not on file.   Social History Main Topics  . Smoking status: Never Smoker  . Smokeless tobacco: Never Used  . Alcohol use 0.0 oz/week     Comment: 1 glass of wine/week but none with preganacy  . Drug use: No  . Sexual activity: Yes    Partners: Male    Birth control/ protection: None   Other Topics Concern  . Not on file   Social History Narrative  . No narrative on file     BP 122/82 (BP Location: Left Arm)   Pulse (!) 108   Ht 5\' 4"  (1.626 m)   Wt 193 lb 3.2 oz (87.6 kg)   BMI 33.16 kg/m   Physical Exam:  Well appearing NAD HEENT: Unremarkable Neck:  6 cm JVD, no thyromegally Back:  No CVA tenderness Lungs:  Clear with no wheezes HEART:  Regular rate rhythm, no murmurs, no rubs, no clicks Abd:  soft, positive bowel sounds, no organomegally, no rebound, no guarding Ext:  2 plus pulses, no edema, no cyanosis, no clubbing Skin:  No rashes no nodules Neuro:  CN II through XII intact, motor grossly intact  EKG - NSR at 90/min.   Assess/Plan: 1. Sinus tachycardia - she is doing reasonably well with her beta blocker therapy. I have asked her to start exercising and maintaining a modest sodium diet. 2. Obesity - she has started dieting with a "Keto" diet.  3. Palpitations - these are much improved. No evidence at this point of SVT.  Leonia Reeves.D.

## 2017-12-02 DIAGNOSIS — M5126 Other intervertebral disc displacement, lumbar region: Secondary | ICD-10-CM | POA: Diagnosis not present

## 2017-12-02 DIAGNOSIS — M5416 Radiculopathy, lumbar region: Secondary | ICD-10-CM | POA: Diagnosis not present

## 2017-12-02 DIAGNOSIS — M545 Low back pain: Secondary | ICD-10-CM | POA: Diagnosis not present

## 2017-12-17 ENCOUNTER — Other Ambulatory Visit: Payer: Self-pay | Admitting: Internal Medicine

## 2017-12-24 DIAGNOSIS — M5382 Other specified dorsopathies, cervical region: Secondary | ICD-10-CM | POA: Diagnosis not present

## 2017-12-24 DIAGNOSIS — M9905 Segmental and somatic dysfunction of pelvic region: Secondary | ICD-10-CM | POA: Diagnosis not present

## 2017-12-24 DIAGNOSIS — M9903 Segmental and somatic dysfunction of lumbar region: Secondary | ICD-10-CM | POA: Diagnosis not present

## 2017-12-24 DIAGNOSIS — M9901 Segmental and somatic dysfunction of cervical region: Secondary | ICD-10-CM | POA: Diagnosis not present

## 2017-12-24 DIAGNOSIS — M5136 Other intervertebral disc degeneration, lumbar region: Secondary | ICD-10-CM | POA: Diagnosis not present

## 2017-12-25 DIAGNOSIS — M5382 Other specified dorsopathies, cervical region: Secondary | ICD-10-CM | POA: Diagnosis not present

## 2017-12-25 DIAGNOSIS — M9905 Segmental and somatic dysfunction of pelvic region: Secondary | ICD-10-CM | POA: Diagnosis not present

## 2017-12-25 DIAGNOSIS — M9901 Segmental and somatic dysfunction of cervical region: Secondary | ICD-10-CM | POA: Diagnosis not present

## 2017-12-28 DIAGNOSIS — M5382 Other specified dorsopathies, cervical region: Secondary | ICD-10-CM | POA: Diagnosis not present

## 2017-12-28 DIAGNOSIS — M9901 Segmental and somatic dysfunction of cervical region: Secondary | ICD-10-CM | POA: Diagnosis not present

## 2017-12-28 DIAGNOSIS — M5136 Other intervertebral disc degeneration, lumbar region: Secondary | ICD-10-CM | POA: Diagnosis not present

## 2017-12-28 DIAGNOSIS — M9905 Segmental and somatic dysfunction of pelvic region: Secondary | ICD-10-CM | POA: Diagnosis not present

## 2017-12-28 DIAGNOSIS — M9903 Segmental and somatic dysfunction of lumbar region: Secondary | ICD-10-CM | POA: Diagnosis not present

## 2017-12-29 DIAGNOSIS — M9905 Segmental and somatic dysfunction of pelvic region: Secondary | ICD-10-CM | POA: Diagnosis not present

## 2017-12-29 DIAGNOSIS — M9901 Segmental and somatic dysfunction of cervical region: Secondary | ICD-10-CM | POA: Diagnosis not present

## 2017-12-29 DIAGNOSIS — M5382 Other specified dorsopathies, cervical region: Secondary | ICD-10-CM | POA: Diagnosis not present

## 2017-12-31 DIAGNOSIS — M5382 Other specified dorsopathies, cervical region: Secondary | ICD-10-CM | POA: Diagnosis not present

## 2017-12-31 DIAGNOSIS — M9905 Segmental and somatic dysfunction of pelvic region: Secondary | ICD-10-CM | POA: Diagnosis not present

## 2017-12-31 DIAGNOSIS — M9901 Segmental and somatic dysfunction of cervical region: Secondary | ICD-10-CM | POA: Diagnosis not present

## 2018-01-04 DIAGNOSIS — M5382 Other specified dorsopathies, cervical region: Secondary | ICD-10-CM | POA: Diagnosis not present

## 2018-01-04 DIAGNOSIS — M9905 Segmental and somatic dysfunction of pelvic region: Secondary | ICD-10-CM | POA: Diagnosis not present

## 2018-01-04 DIAGNOSIS — M9901 Segmental and somatic dysfunction of cervical region: Secondary | ICD-10-CM | POA: Diagnosis not present

## 2018-01-05 DIAGNOSIS — M5382 Other specified dorsopathies, cervical region: Secondary | ICD-10-CM | POA: Diagnosis not present

## 2018-01-05 DIAGNOSIS — M9905 Segmental and somatic dysfunction of pelvic region: Secondary | ICD-10-CM | POA: Diagnosis not present

## 2018-01-05 DIAGNOSIS — M9901 Segmental and somatic dysfunction of cervical region: Secondary | ICD-10-CM | POA: Diagnosis not present

## 2018-01-07 DIAGNOSIS — M5382 Other specified dorsopathies, cervical region: Secondary | ICD-10-CM | POA: Diagnosis not present

## 2018-01-07 DIAGNOSIS — M9905 Segmental and somatic dysfunction of pelvic region: Secondary | ICD-10-CM | POA: Diagnosis not present

## 2018-01-07 DIAGNOSIS — M9901 Segmental and somatic dysfunction of cervical region: Secondary | ICD-10-CM | POA: Diagnosis not present

## 2018-01-11 DIAGNOSIS — M5382 Other specified dorsopathies, cervical region: Secondary | ICD-10-CM | POA: Diagnosis not present

## 2018-01-11 DIAGNOSIS — M9901 Segmental and somatic dysfunction of cervical region: Secondary | ICD-10-CM | POA: Diagnosis not present

## 2018-01-11 DIAGNOSIS — M9905 Segmental and somatic dysfunction of pelvic region: Secondary | ICD-10-CM | POA: Diagnosis not present

## 2018-01-12 DIAGNOSIS — M9905 Segmental and somatic dysfunction of pelvic region: Secondary | ICD-10-CM | POA: Diagnosis not present

## 2018-01-12 DIAGNOSIS — M9901 Segmental and somatic dysfunction of cervical region: Secondary | ICD-10-CM | POA: Diagnosis not present

## 2018-01-12 DIAGNOSIS — M5382 Other specified dorsopathies, cervical region: Secondary | ICD-10-CM | POA: Diagnosis not present

## 2018-01-18 DIAGNOSIS — M9905 Segmental and somatic dysfunction of pelvic region: Secondary | ICD-10-CM | POA: Diagnosis not present

## 2018-01-18 DIAGNOSIS — M5382 Other specified dorsopathies, cervical region: Secondary | ICD-10-CM | POA: Diagnosis not present

## 2018-01-18 DIAGNOSIS — M9901 Segmental and somatic dysfunction of cervical region: Secondary | ICD-10-CM | POA: Diagnosis not present

## 2018-01-21 DIAGNOSIS — M5382 Other specified dorsopathies, cervical region: Secondary | ICD-10-CM | POA: Diagnosis not present

## 2018-01-21 DIAGNOSIS — M9905 Segmental and somatic dysfunction of pelvic region: Secondary | ICD-10-CM | POA: Diagnosis not present

## 2018-01-21 DIAGNOSIS — M9901 Segmental and somatic dysfunction of cervical region: Secondary | ICD-10-CM | POA: Diagnosis not present

## 2018-01-25 DIAGNOSIS — M9905 Segmental and somatic dysfunction of pelvic region: Secondary | ICD-10-CM | POA: Diagnosis not present

## 2018-01-25 DIAGNOSIS — M5382 Other specified dorsopathies, cervical region: Secondary | ICD-10-CM | POA: Diagnosis not present

## 2018-01-25 DIAGNOSIS — M9901 Segmental and somatic dysfunction of cervical region: Secondary | ICD-10-CM | POA: Diagnosis not present

## 2018-01-26 DIAGNOSIS — M5382 Other specified dorsopathies, cervical region: Secondary | ICD-10-CM | POA: Diagnosis not present

## 2018-01-26 DIAGNOSIS — M9905 Segmental and somatic dysfunction of pelvic region: Secondary | ICD-10-CM | POA: Diagnosis not present

## 2018-01-26 DIAGNOSIS — M9901 Segmental and somatic dysfunction of cervical region: Secondary | ICD-10-CM | POA: Diagnosis not present

## 2018-02-03 DIAGNOSIS — M5382 Other specified dorsopathies, cervical region: Secondary | ICD-10-CM | POA: Diagnosis not present

## 2018-02-03 DIAGNOSIS — M9901 Segmental and somatic dysfunction of cervical region: Secondary | ICD-10-CM | POA: Diagnosis not present

## 2018-02-03 DIAGNOSIS — M9905 Segmental and somatic dysfunction of pelvic region: Secondary | ICD-10-CM | POA: Diagnosis not present

## 2018-02-08 DIAGNOSIS — M9905 Segmental and somatic dysfunction of pelvic region: Secondary | ICD-10-CM | POA: Diagnosis not present

## 2018-02-08 DIAGNOSIS — M5382 Other specified dorsopathies, cervical region: Secondary | ICD-10-CM | POA: Diagnosis not present

## 2018-02-08 DIAGNOSIS — M9901 Segmental and somatic dysfunction of cervical region: Secondary | ICD-10-CM | POA: Diagnosis not present

## 2018-02-11 DIAGNOSIS — M9905 Segmental and somatic dysfunction of pelvic region: Secondary | ICD-10-CM | POA: Diagnosis not present

## 2018-02-11 DIAGNOSIS — M9901 Segmental and somatic dysfunction of cervical region: Secondary | ICD-10-CM | POA: Diagnosis not present

## 2018-02-11 DIAGNOSIS — M5382 Other specified dorsopathies, cervical region: Secondary | ICD-10-CM | POA: Diagnosis not present

## 2018-02-15 DIAGNOSIS — M9905 Segmental and somatic dysfunction of pelvic region: Secondary | ICD-10-CM | POA: Diagnosis not present

## 2018-02-15 DIAGNOSIS — M5382 Other specified dorsopathies, cervical region: Secondary | ICD-10-CM | POA: Diagnosis not present

## 2018-02-15 DIAGNOSIS — M9901 Segmental and somatic dysfunction of cervical region: Secondary | ICD-10-CM | POA: Diagnosis not present

## 2018-02-24 DIAGNOSIS — M9905 Segmental and somatic dysfunction of pelvic region: Secondary | ICD-10-CM | POA: Diagnosis not present

## 2018-02-24 DIAGNOSIS — M9901 Segmental and somatic dysfunction of cervical region: Secondary | ICD-10-CM | POA: Diagnosis not present

## 2018-02-24 DIAGNOSIS — M5382 Other specified dorsopathies, cervical region: Secondary | ICD-10-CM | POA: Diagnosis not present

## 2018-02-26 DIAGNOSIS — M9901 Segmental and somatic dysfunction of cervical region: Secondary | ICD-10-CM | POA: Diagnosis not present

## 2018-02-26 DIAGNOSIS — M9905 Segmental and somatic dysfunction of pelvic region: Secondary | ICD-10-CM | POA: Diagnosis not present

## 2018-02-26 DIAGNOSIS — M5382 Other specified dorsopathies, cervical region: Secondary | ICD-10-CM | POA: Diagnosis not present

## 2018-03-02 DIAGNOSIS — M9901 Segmental and somatic dysfunction of cervical region: Secondary | ICD-10-CM | POA: Diagnosis not present

## 2018-03-02 DIAGNOSIS — M5382 Other specified dorsopathies, cervical region: Secondary | ICD-10-CM | POA: Diagnosis not present

## 2018-03-02 DIAGNOSIS — M9905 Segmental and somatic dysfunction of pelvic region: Secondary | ICD-10-CM | POA: Diagnosis not present

## 2018-03-04 DIAGNOSIS — M9901 Segmental and somatic dysfunction of cervical region: Secondary | ICD-10-CM | POA: Diagnosis not present

## 2018-03-04 DIAGNOSIS — M5382 Other specified dorsopathies, cervical region: Secondary | ICD-10-CM | POA: Diagnosis not present

## 2018-03-04 DIAGNOSIS — M9905 Segmental and somatic dysfunction of pelvic region: Secondary | ICD-10-CM | POA: Diagnosis not present

## 2018-03-16 DIAGNOSIS — M9901 Segmental and somatic dysfunction of cervical region: Secondary | ICD-10-CM | POA: Diagnosis not present

## 2018-03-16 DIAGNOSIS — M5382 Other specified dorsopathies, cervical region: Secondary | ICD-10-CM | POA: Diagnosis not present

## 2018-03-16 DIAGNOSIS — M9905 Segmental and somatic dysfunction of pelvic region: Secondary | ICD-10-CM | POA: Diagnosis not present

## 2018-03-30 DIAGNOSIS — M9905 Segmental and somatic dysfunction of pelvic region: Secondary | ICD-10-CM | POA: Diagnosis not present

## 2018-03-30 DIAGNOSIS — M9901 Segmental and somatic dysfunction of cervical region: Secondary | ICD-10-CM | POA: Diagnosis not present

## 2018-03-30 DIAGNOSIS — M5382 Other specified dorsopathies, cervical region: Secondary | ICD-10-CM | POA: Diagnosis not present

## 2018-04-13 DIAGNOSIS — R339 Retention of urine, unspecified: Secondary | ICD-10-CM | POA: Diagnosis not present

## 2018-04-13 DIAGNOSIS — N941 Unspecified dyspareunia: Secondary | ICD-10-CM | POA: Diagnosis not present

## 2018-04-27 DIAGNOSIS — N941 Unspecified dyspareunia: Secondary | ICD-10-CM | POA: Diagnosis not present

## 2018-05-31 DIAGNOSIS — Z0000 Encounter for general adult medical examination without abnormal findings: Secondary | ICD-10-CM | POA: Diagnosis not present

## 2018-05-31 DIAGNOSIS — Z13220 Encounter for screening for lipoid disorders: Secondary | ICD-10-CM | POA: Diagnosis not present

## 2018-05-31 DIAGNOSIS — Z130 Encounter for screening for diseases of the blood and blood-forming organs and certain disorders involving the immune mechanism: Secondary | ICD-10-CM | POA: Diagnosis not present

## 2018-05-31 DIAGNOSIS — Z1329 Encounter for screening for other suspected endocrine disorder: Secondary | ICD-10-CM | POA: Diagnosis not present

## 2018-05-31 DIAGNOSIS — Z01419 Encounter for gynecological examination (general) (routine) without abnormal findings: Secondary | ICD-10-CM | POA: Diagnosis not present

## 2018-05-31 DIAGNOSIS — Z6827 Body mass index (BMI) 27.0-27.9, adult: Secondary | ICD-10-CM | POA: Diagnosis not present

## 2018-05-31 DIAGNOSIS — Z131 Encounter for screening for diabetes mellitus: Secondary | ICD-10-CM | POA: Diagnosis not present

## 2018-06-24 ENCOUNTER — Other Ambulatory Visit: Payer: Self-pay | Admitting: Internal Medicine

## 2018-06-24 MED ORDER — METOPROLOL SUCCINATE ER 25 MG PO TB24
25.0000 mg | ORAL_TABLET | Freq: Every day | ORAL | 0 refills | Status: DC
Start: 2018-06-24 — End: 2018-07-16

## 2018-06-24 NOTE — Telephone Encounter (Signed)
Pt's medication was sent to pt's pharmacy as requested. Confirmation received.  °

## 2018-06-25 ENCOUNTER — Encounter: Payer: Self-pay | Admitting: Physician Assistant

## 2018-07-01 ENCOUNTER — Ambulatory Visit: Payer: BLUE CROSS/BLUE SHIELD | Admitting: Physician Assistant

## 2018-07-16 ENCOUNTER — Ambulatory Visit: Payer: BLUE CROSS/BLUE SHIELD | Admitting: Physician Assistant

## 2018-07-16 ENCOUNTER — Other Ambulatory Visit: Payer: Self-pay

## 2018-07-16 MED ORDER — METOPROLOL SUCCINATE ER 25 MG PO TB24: 25.0000 mg | ORAL_TABLET | Freq: Every day | ORAL | 0 refills | Status: DC

## 2018-09-03 DIAGNOSIS — M2550 Pain in unspecified joint: Secondary | ICD-10-CM | POA: Diagnosis not present

## 2018-09-03 DIAGNOSIS — R5383 Other fatigue: Secondary | ICD-10-CM | POA: Diagnosis not present

## 2018-09-03 DIAGNOSIS — M533 Sacrococcygeal disorders, not elsewhere classified: Secondary | ICD-10-CM | POA: Diagnosis not present

## 2018-09-03 DIAGNOSIS — M79671 Pain in right foot: Secondary | ICD-10-CM | POA: Diagnosis not present

## 2018-09-03 DIAGNOSIS — M545 Low back pain: Secondary | ICD-10-CM | POA: Diagnosis not present

## 2018-09-03 DIAGNOSIS — M791 Myalgia, unspecified site: Secondary | ICD-10-CM | POA: Diagnosis not present

## 2018-09-03 DIAGNOSIS — M79641 Pain in right hand: Secondary | ICD-10-CM | POA: Diagnosis not present

## 2018-09-06 DIAGNOSIS — M791 Myalgia, unspecified site: Secondary | ICD-10-CM | POA: Diagnosis not present

## 2018-09-06 DIAGNOSIS — R5383 Other fatigue: Secondary | ICD-10-CM | POA: Diagnosis not present

## 2018-09-06 DIAGNOSIS — M2550 Pain in unspecified joint: Secondary | ICD-10-CM | POA: Diagnosis not present

## 2018-09-06 DIAGNOSIS — D8989 Other specified disorders involving the immune mechanism, not elsewhere classified: Secondary | ICD-10-CM | POA: Diagnosis not present

## 2018-09-10 ENCOUNTER — Other Ambulatory Visit: Payer: Self-pay | Admitting: Internal Medicine

## 2018-09-16 DIAGNOSIS — G4700 Insomnia, unspecified: Secondary | ICD-10-CM | POA: Diagnosis not present

## 2018-09-16 DIAGNOSIS — R5383 Other fatigue: Secondary | ICD-10-CM | POA: Diagnosis not present

## 2018-09-16 DIAGNOSIS — M2550 Pain in unspecified joint: Secondary | ICD-10-CM | POA: Diagnosis not present

## 2018-09-16 DIAGNOSIS — R7611 Nonspecific reaction to tuberculin skin test without active tuberculosis: Secondary | ICD-10-CM | POA: Diagnosis not present

## 2018-09-17 DIAGNOSIS — F5101 Primary insomnia: Secondary | ICD-10-CM | POA: Diagnosis not present

## 2018-09-17 DIAGNOSIS — M797 Fibromyalgia: Secondary | ICD-10-CM | POA: Diagnosis not present

## 2018-10-01 ENCOUNTER — Ambulatory Visit (INDEPENDENT_AMBULATORY_CARE_PROVIDER_SITE_OTHER): Payer: 59 | Admitting: Internal Medicine

## 2018-10-01 ENCOUNTER — Encounter: Payer: Self-pay | Admitting: Internal Medicine

## 2018-10-01 VITALS — BP 126/64 | HR 107 | Ht 64.0 in | Wt 174.0 lb

## 2018-10-01 DIAGNOSIS — R002 Palpitations: Secondary | ICD-10-CM | POA: Diagnosis not present

## 2018-10-01 MED ORDER — METOPROLOL SUCCINATE ER 25 MG PO TB24: 25.0000 mg | ORAL_TABLET | Freq: Every day | ORAL | 3 refills | Status: DC

## 2018-10-01 NOTE — Progress Notes (Signed)
HPI Kim Kennedy returns today for followup of palpitations. She is a pleasant 37 yo woman with a h/o palpitations but no documented SVT. She denies chest pain or sob. Her daughter is almost 6 years old. She has palpitations but no sustained SVT. She admits to caffeine indescretion. She has gained some weight. .  Allergies  Allergen Reactions  . Erythromycin Anaphylaxis  . Latex Swelling and Rash    Swelling at contact site  . Zithromax [Azithromycin] Anaphylaxis and Other (See Comments)    Tightness in chest  . Augmentin [Amoxicillin-Pot Clavulanate] Diarrhea and Nausea And Vomiting  . Penicillins Diarrhea and Nausea And Vomiting     Current Outpatient Medications  Medication Sig Dispense Refill  . albuterol (PROVENTIL HFA;VENTOLIN HFA) 108 (90 BASE) MCG/ACT inhaler Inhale 2 puffs into the lungs every 6 (six) hours as needed for wheezing or shortness of breath.    . cyclobenzaprine (FLEXERIL) 10 MG tablet Take 10 mg by mouth daily.    Marland Kitchen gabapentin (NEURONTIN) 100 MG capsule Take 300 mg by mouth at bedtime.  0  . HYDROcodone-acetaminophen (NORCO) 7.5-325 MG tablet Take 1 tablet by mouth every 6 (six) hours as needed for pain.    . hydroxychloroquine (PLAQUENIL) 200 MG tablet Take 200 mg by mouth daily.    . JUNEL FE 24 1-20 MG-MCG(24) tablet Take 1 tablet by mouth as directed.    . metoprolol succinate (TOPROL-XL) 25 MG 24 hr tablet Take 1 tablet (25 mg total) by mouth daily. Please keep upcoming appt for future refills. Thank you 90 tablet 0  . Multiple Vitamins-Minerals (MULTIVITAMIN ADULT PO) Take 1 tablet by mouth daily.    . nortriptyline (PAMELOR) 50 MG capsule Take 50 mg by mouth at bedtime.    Marland Kitchen QVAR 40 MCG/ACT inhaler Inhale 2 puffs into the lungs 2 (two) times daily as needed for shortness of breath or wheezing.    . sertraline (ZOLOFT) 100 MG tablet Take 100 mg by mouth daily.     No current facility-administered medications for this visit.      Past Medical  History:  Diagnosis Date  . Anxiety    hx - no meds  . Asthma    prn -seasonal use of inhaler only - rarely use  . Breech presentation 11/22/2014  . Cesarean delivery delivered 12/26/2014  . Chest tightness    anxiety related  . Cramping affecting pregnancy, antepartum   . Depression    hx - no meds   . Dysrhythmia    Hx tachycardia - controlled with lopressor, no problems  . Fibromyalgia 2007   no meds  . GBS (group B streptococcus) UTI complicating pregnancy   . History of asthma   . 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 IBS (irritable bowel syndrome)    diet controlled  . Inactive TB 2011   chest xray neg  . LGSIL of cervix of undetermined significance   . Nausea    patient denies  . Osteoarthritis   . Palpitations 08/30/2014  . Polycystic ovarian syndrome 2011   Borderline A1C  . Postpartum care following cesarean delivery (2/2) 12/26/2014  . Tachycardia    controlled with lopressor, no problems  . Third trimester pregnancy at less than 36 weeks 11/22/2014    ROS:   All systems reviewed and negative except as noted in the HPI.   Past Surgical History:  Procedure Laterality Date  . CESAREAN SECTION N/A 12/26/2014   Procedure: Primary CESAREAN SECTION;  Surgeon: Lenoard Aden, MD;  Location: [facility];  Service: Obstetrics;  Laterality: N/A;  EDD: 12/23/14  . COLPOSCOPY W/ BIOPSY / CURETTAGE    . DILATION AND EVACUATION N/A 12/26/2014   Procedure: DILATATION AND EVACUATION with Bakri Balloon Placement;  Surgeon: Lenoard Aden, MD;  Location: [facility];  Service: Gynecology;  Laterality: N/A;  . LAPAROSCOPIC LYSIS OF ADHESIONS N/A 02/27/2014   Procedure: LAPAROSCOPIC LYSIS OF ADHESIONS;  Surgeon: Lenoard Aden, MD;  Location: [facility];  Service: Gynecology;  Laterality: N/A;  . LAPAROSCOPY N/A 02/27/2014   Procedure: LAPAROSCOPY DIAGNOSTIC;  Surgeon: Lenoard Aden, MD;  Location: [facility];  Service: Gynecology;  Laterality: N/A;  . MANDIBLE FRACTURE SURGERY  03/2001   x 7 jaw  surgeries. TMJ  . NASAL SEPTUM SURGERY  03/2001  . tear duct surgery     at 2 months old - right eye  . TONSILLECTOMY AND ADENOIDECTOMY  2000  . WISDOM TOOTH EXTRACTION       Family History  Problem Relation Age of Onset  . Fibromyalgia Mother   . Thrombocytopenia Mother        had spleen removed  . Hepatitis C Mother   . Osteoporosis Mother   . Anxiety disorder Mother   . Hyperlipidemia Father   . Narcolepsy Father   . Heart attack Maternal Grandmother   . Heart attack Maternal Grandfather   . Heart attack Paternal Grandmother   . Osteoporosis Paternal Grandmother   . Heart attack Paternal Grandfather   . Diabetes Paternal Uncle        DM     Social History   Socioeconomic History  . Marital status: Married    Spouse name: Not on file  . Number of children: Not on file  . Years of education: Not on file  . Highest education level: Not on file  Occupational History  . Not on file  Social Needs  . Financial resource strain: Not on file  . Food insecurity:    Worry: Not on file    Inability: Not on file  . Transportation needs:    Medical: Not on file    Non-medical: Not on file  Tobacco Use  . Smoking status: Never Smoker  . Smokeless tobacco: Never Used  Substance and Sexual Activity  . Alcohol use: Yes    Comment: 1 glass of wine/week but none with preganacy  . Drug use: No  . Sexual activity: Yes    Partners: Male    Birth control/protection: None  Lifestyle  . Physical activity:    Days per week: Not on file    Minutes per session: Not on file  . Stress: Not on file  Relationships  . Social connections:    Talks on phone: Not on file    Gets together: Not on file    Attends religious service: Not on file    Active member of club or organization: Not on file    Attends meetings of clubs or organizations: Not on file    Relationship status: Not on file  . Intimate partner violence:    Fear of current or ex partner: Not on file    Emotionally  abused: Not on file    Physically abused: Not on file    Forced sexual activity: Not on file  Other Topics Concern  . Not on file  Social History Narrative  . Not on file     Pulse (!) 107   Ht 5'  4" (1.626 m)   Wt 174 lb (78.9 kg)   BMI 29.87 kg/m   Physical Exam:  Well appearing NAD HEENT: Unremarkable Neck:  No JVD, no thyromegally Lymphatics:  No adenopathy Back:  No CVA tenderness Lungs:  Clear with now wheezes HEART:  Regular rate rhythm, no murmurs, no rubs, no clicks Abd:  soft, positive bowel sounds, no organomegally, no rebound, no guarding Ext:  2 plus pulses, no edema, no cyanosis, no clubbing Skin:  No rashes no nodules Neuro:  CN II through XII intact, motor grossly intact  EKG - sinus tachycardia   Assess/Plan: 1. Palpitations - her symptoms are reasonably well controlled when she avoids caffeine. I encouraged watchful waiting. She will continue her beta blocker. 2. Sinus tachycardia - she appears to be both deconditioned but also drinking too much caffeine.   Leonia Reeves.D.

## 2018-10-01 NOTE — Patient Instructions (Signed)

## 2018-10-02 ENCOUNTER — Encounter: Payer: Self-pay | Admitting: Internal Medicine

## 2018-10-29 DIAGNOSIS — M797 Fibromyalgia: Secondary | ICD-10-CM | POA: Diagnosis not present

## 2018-11-01 ENCOUNTER — Other Ambulatory Visit: Payer: Self-pay | Admitting: Internal Medicine

## 2018-11-01 MED ORDER — METOPROLOL SUCCINATE ER 25 MG PO TB24: 25.0000 mg | ORAL_TABLET | Freq: Every day | ORAL | 3 refills | Status: DC

## 2018-11-09 DIAGNOSIS — Z79899 Other long term (current) drug therapy: Secondary | ICD-10-CM | POA: Diagnosis not present

## 2018-11-26 DIAGNOSIS — Z79899 Other long term (current) drug therapy: Secondary | ICD-10-CM | POA: Diagnosis not present

## 2018-11-26 DIAGNOSIS — Z7689 Persons encountering health services in other specified circumstances: Secondary | ICD-10-CM | POA: Diagnosis not present

## 2018-11-26 DIAGNOSIS — M1990 Unspecified osteoarthritis, unspecified site: Secondary | ICD-10-CM | POA: Diagnosis not present

## 2018-11-26 DIAGNOSIS — J181 Lobar pneumonia, unspecified organism: Secondary | ICD-10-CM | POA: Diagnosis not present

## 2018-11-26 DIAGNOSIS — R768 Other specified abnormal immunological findings in serum: Secondary | ICD-10-CM | POA: Diagnosis not present

## 2018-12-08 DIAGNOSIS — M1990 Unspecified osteoarthritis, unspecified site: Secondary | ICD-10-CM | POA: Diagnosis not present

## 2018-12-08 DIAGNOSIS — J984 Other disorders of lung: Secondary | ICD-10-CM | POA: Diagnosis not present

## 2018-12-17 DIAGNOSIS — M797 Fibromyalgia: Secondary | ICD-10-CM | POA: Diagnosis not present

## 2018-12-29 DIAGNOSIS — M1990 Unspecified osteoarthritis, unspecified site: Secondary | ICD-10-CM | POA: Diagnosis not present

## 2018-12-29 DIAGNOSIS — R768 Other specified abnormal immunological findings in serum: Secondary | ICD-10-CM | POA: Diagnosis not present

## 2018-12-29 DIAGNOSIS — M797 Fibromyalgia: Secondary | ICD-10-CM | POA: Diagnosis not present

## 2018-12-29 DIAGNOSIS — Z79899 Other long term (current) drug therapy: Secondary | ICD-10-CM | POA: Diagnosis not present

## 2019-03-02 DIAGNOSIS — M797 Fibromyalgia: Secondary | ICD-10-CM | POA: Diagnosis not present

## 2019-03-02 DIAGNOSIS — F5101 Primary insomnia: Secondary | ICD-10-CM | POA: Diagnosis not present

## 2019-03-11 DIAGNOSIS — M1990 Unspecified osteoarthritis, unspecified site: Secondary | ICD-10-CM | POA: Diagnosis not present

## 2019-03-11 DIAGNOSIS — R768 Other specified abnormal immunological findings in serum: Secondary | ICD-10-CM | POA: Diagnosis not present

## 2019-03-11 DIAGNOSIS — Z79899 Other long term (current) drug therapy: Secondary | ICD-10-CM | POA: Diagnosis not present

## 2019-03-14 DIAGNOSIS — M79643 Pain in unspecified hand: Secondary | ICD-10-CM | POA: Diagnosis not present

## 2019-03-14 DIAGNOSIS — R5383 Other fatigue: Secondary | ICD-10-CM | POA: Diagnosis not present

## 2019-03-14 DIAGNOSIS — M2550 Pain in unspecified joint: Secondary | ICD-10-CM | POA: Diagnosis not present

## 2019-03-14 DIAGNOSIS — M797 Fibromyalgia: Secondary | ICD-10-CM | POA: Diagnosis not present

## 2019-08-16 DIAGNOSIS — G5603 Carpal tunnel syndrome, bilateral upper limbs: Secondary | ICD-10-CM | POA: Insufficient documentation

## 2019-08-30 ENCOUNTER — Other Ambulatory Visit: Payer: Self-pay | Admitting: Internal Medicine

## 2019-10-04 ENCOUNTER — Other Ambulatory Visit: Payer: Self-pay

## 2019-10-04 ENCOUNTER — Emergency Department (HOSPITAL_COMMUNITY): Payer: 59

## 2019-10-04 ENCOUNTER — Emergency Department (HOSPITAL_COMMUNITY)
Admission: EM | Admit: 2019-10-04 | Discharge: 2019-10-04 | Disposition: A | Discharge status: Home / Self Care | Payer: 59 | Attending: Emergency Medicine | Admitting: Emergency Medicine

## 2019-10-04 ENCOUNTER — Encounter (HOSPITAL_COMMUNITY): Payer: Self-pay | Admitting: Emergency Medicine

## 2019-10-04 DIAGNOSIS — J45909 Unspecified asthma, uncomplicated: Secondary | ICD-10-CM | POA: Diagnosis not present

## 2019-10-04 DIAGNOSIS — R059 Cough, unspecified: Secondary | ICD-10-CM

## 2019-10-04 DIAGNOSIS — R0602 Shortness of breath: Secondary | ICD-10-CM

## 2019-10-04 DIAGNOSIS — R05 Cough: Secondary | ICD-10-CM | POA: Insufficient documentation

## 2019-10-04 DIAGNOSIS — Z20828 Contact with and (suspected) exposure to other viral communicable diseases: Secondary | ICD-10-CM | POA: Insufficient documentation

## 2019-10-04 LAB — BASIC METABOLIC PANEL
Anion gap: 8 (ref 5–15)
BUN: 13 mg/dL (ref 6–20)
CO2: 25 mmol/L (ref 22–32)
Calcium: 8.6 mg/dL — ABNORMAL LOW (ref 8.9–10.3)
Chloride: 105 mmol/L (ref 98–111)
Creatinine, Ser: 0.8 mg/dL (ref 0.44–1.00)
GFR calc Af Amer: >60 mL/min (ref >60)
GFR calc non Af Amer: >60 mL/min (ref >60)
Glucose, Bld: 88 mg/dL (ref 70–99)
Potassium: 4.2 mmol/L (ref 3.5–5.1)
Sodium: 138 mmol/L (ref 135–145)

## 2019-10-04 LAB — CBC WITH DIFFERENTIAL/PLATELET
Abs Immature Granulocytes: 0.02 10*3/uL (ref 0.00–0.07)
Basophils Absolute: 0.1 10*3/uL (ref 0.0–0.1)
Basophils Relative: 1 %
Eosinophils Absolute: 0.1 10*3/uL (ref 0.0–0.5)
Eosinophils Relative: 2 %
HCT: 44.3 % (ref 36.0–46.0)
Hemoglobin: 14.2 g/dL (ref 12.0–15.0)
Immature Granulocytes: 0 %
Lymphocytes Relative: 33 %
Lymphs Abs: 2.1 10*3/uL (ref 0.7–4.0)
MCH: 29.2 pg (ref 26.0–34.0)
MCHC: 32.1 g/dL (ref 30.0–36.0)
MCV: 91 fL (ref 80.0–100.0)
Monocytes Absolute: 0.6 10*3/uL (ref 0.1–1.0)
Monocytes Relative: 9 %
Neutro Abs: 3.4 10*3/uL (ref 1.7–7.7)
Neutrophils Relative %: 55 %
Platelets: 286 10*3/uL (ref 150–400)
RBC: 4.87 MIL/uL (ref 3.87–5.11)
RDW: 12.9 % (ref 11.5–15.5)
WBC: 6.2 10*3/uL (ref 4.0–10.5)
nRBC: 0 % (ref 0.0–0.2)

## 2019-10-04 LAB — I-STAT BETA HCG BLOOD, ED (MC, WL, AP ONLY): I-stat hCG, quantitative: <5 m[IU]/mL (ref <5)

## 2019-10-04 LAB — D-DIMER, QUANTITATIVE: D-Dimer, Quant: 0.3 ug/mL-FEU (ref 0.00–0.50)

## 2019-10-04 LAB — TROPONIN I (HIGH SENSITIVITY)
Troponin I (High Sensitivity): <2 ng/L (ref <18)
Troponin I (High Sensitivity): <2 ng/L (ref <18)

## 2019-10-04 MED ORDER — SODIUM CHLORIDE 0.9 % IV BOLUS
1000.0000 mL | Freq: Once | INTRAVENOUS | Status: AC
  Administered 2019-10-04: 1000 mL via INTRAVENOUS

## 2019-10-04 NOTE — ED Notes (Signed)
Pt verbalizes understanding of DC instructions. Pt belongings returned and is ambulatory out of ED.  

## 2019-10-04 NOTE — ED Provider Notes (Signed)
Care assumed from [facility], PA-C at shift change with labs pending .  In brief, this patient is a 38 y.o. F with PMH/o Asthma, anxiety who presents for evaluation of shortness of breath that has been ongoing for the last 6 days.  She also reports some constant chest pressure that is nonexertional and is reproducible with palpation.  Patient states that she did have some mild loss of taste and smell about a week ago.  She had negative Covid test 6 days ago.  She was continuing to have symptoms in her Pap primary care told her to go to the emergency department for further evaluation.  She is currently on OCPs.  Please see note from previous provider for full history/physical exam.  Physical Exam  BP 122/77   Pulse 92   Temp 98.2 F (36.8 C) (Oral)   Resp 19   Ht 5\' 4"  (1.626 m)   Wt 73.9 kg   SpO2 94%   BMI 27.98 kg/m   Physical Exam  ED Course/Procedures     Procedures  EKG Interpretation  Date/Time:  Tuesday October 04 2019 17:33:39 EST Ventricular Rate:  88 PR Interval:    QRS Duration: 85 QT Interval:  410 QTC Calculation: 497 R Axis:   67 Text Interpretation: Sinus rhythm Low voltage, precordial leads Borderline T abnormalities, anterior leads Borderline prolonged QT interval Since last tracing rate slower Confirmed by [id number] [phone]) on 10/04/2019 6:03:05 PM   Results for orders placed or performed during the hospital encounter of 10/04/19 (from the past 24 hour(s))  CBC with Differential     Status: None   Collection Time: 10/04/19  3:37 PM  Result Value Ref Range   WBC 6.2 4.0 - 10.5 K/uL   RBC 4.87 3.87 - 5.11 MIL/uL   Hemoglobin 14.2 12.0 - 15.0 g/dL   HCT 13/10/20 29.5 - 28.4 %   MCV 91.0 80.0 - 100.0 fL   MCH 29.2 26.0 - 34.0 pg   MCHC 32.1 30.0 - 36.0 g/dL   RDW 13.2 44.0 - 10.2 %   Platelets 286 150 - 400 K/uL   nRBC 0.0 0.0 - 0.2 %   Neutrophils Relative % 55 %   Neutro Abs 3.4 1.7 - 7.7 K/uL   Lymphocytes Relative 33 %   Lymphs Abs 2.1 0.7 -  4.0 K/uL   Monocytes Relative 9 %   Monocytes Absolute 0.6 0.1 - 1.0 K/uL   Eosinophils Relative 2 %   Eosinophils Absolute 0.1 0.0 - 0.5 K/uL   Basophils Relative 1 %   Basophils Absolute 0.1 0.0 - 0.1 K/uL   Immature Granulocytes 0 %   Abs Immature Granulocytes 0.02 0.00 - 0.07 K/uL  Basic metabolic panel     Status: Abnormal   Collection Time: 10/04/19  3:37 PM  Result Value Ref Range   Sodium 138 135 - 145 mmol/L   Potassium 4.2 3.5 - 5.1 mmol/L   Chloride 105 98 - 111 mmol/L   CO2 25 22 - 32 mmol/L   Glucose, Bld 88 70 - 99 mg/dL   BUN 13 6 - 20 mg/dL   Creatinine, Ser 13/10/20 0.44 - 1.00 mg/dL   Calcium 8.6 (L) 8.9 - 10.3 mg/dL   GFR calc non Af Amer >60 >60 mL/min   GFR calc Af Amer >60 >60 mL/min   Anion gap 8 5 - 15  D-dimer, quantitative (not at [facility])     Status: None   Collection Time: 10/04/19  3:37  PM  Result Value Ref Range   D-Dimer, Quant 0.30 0.00 - 0.50 ug/mL-FEU  Troponin I (High Sensitivity)     Status: None   Collection Time: 10/04/19  3:37 PM  Result Value Ref Range   Troponin I (High Sensitivity) <2 <18 ng/L  I-Stat Beta hCG blood, ED ([facility], [facility], [facility] only)     Status: None   Collection Time: 10/04/19  3:49 PM  Result Value Ref Range   I-stat hCG, quantitative <5.0 <5 mIU/mL   Comment 3          Troponin I (High Sensitivity)     Status: None   Collection Time: 10/04/19  5:24 PM  Result Value Ref Range   Troponin I (High Sensitivity) <2.00 <18 ng/L    MDM    PLAN: Patient pending labs.  If unremarkable, plan to discharge home.  Will outpatient Covid test.  Patient's pain has been ongoing for 6 days, no indication for delta troponin.  MDM:  I-STAT beta negative.  BMP is unremarkable.  Troponin is negative.  D-dimer is negative.  CBC without any significant leukocytosis or anemia.  Chest x-ray shows no evidence infectious etiology.   RN inform me that patient was having palpitation feeling and fluttering in her chest.  Monitor showed no evidence of  PVCs.  Repeat EKG was performed.  Delta troponin negative.  I discussed results with patient.  At this time, her work-up is reassuring.  I discussed with patient that she needs to follow-up with her primary care doctor and with her EP doctor.  She has not had a Holter monitor about 5 years.  Patient states that she has recently tested negative for Covid but a week ago but was concerned about that this may related from Covid symptoms.  Discussed doing outpatient testing. At this time, patient exhibits no emergent life-threatening condition that require further evaluation in ED or admission. Patient had ample opportunity for questions and discussion. All patient's questions were answered with full understanding. Strict return precautions discussed. Patient expresses understanding and agreement to plan.    1. Cough   2. SOB (shortness of breath)    Portions of this note were generated with [employer]. Dictation errors may occur despite best attempts at proofreading.    Volanda Napoleon, PA-C 10/04/19 [id number]    Dorie Rank, MD 10/05/19 1650

## 2019-10-04 NOTE — Discharge Instructions (Signed)
As we discussed, your work-up today was reassuring.  Follow-up with your primary care doctor and with Dr. Ladona Ridgelaylor.  As we discussed, you have a Covid test pending.  If it is positive, you will need quarantine for 2 weeks.     Person Under Monitoring Name: Kim Kennedy RolesSarah E Kennedy  Location: [id number] [place] [id number]   Infection Prevention Recommendations for Individuals Confirmed to have, or Being Evaluated for, 2019 Novel Coronavirus (COVID-19) Infection Who Receive Care at Home  Individuals who are confirmed to have, or are being evaluated for, COVID-19 should follow the prevention steps below until a healthcare provider or local or state health department says they can return to normal activities.  Stay home except to get medical care You should restrict activities outside your home, except for getting medical care. Do not go to work, school, or public areas, and do not use public transportation or taxis.  Call ahead before visiting your doctor Before your medical appointment, call the healthcare provider and tell them that you have, or are being evaluated for, COVID-19 infection. This will help the healthcare providers office take steps to keep other people from getting infected. Ask your healthcare provider to call the local or state health department.  Monitor your symptoms Seek prompt medical attention if your illness is worsening (e.g., difficulty breathing). Before going to your medical appointment, call the healthcare provider and tell them that you have, or are being evaluated for, COVID-19 infection. Ask your healthcare provider to call the local or state health department.  Wear a facemask You should wear a facemask that covers your nose and mouth when you are in the same room with other people and when you visit a healthcare provider. People who live with or visit you should also wear a facemask while they are in the same room with you.  Separate yourself  from other people in your home As much as possible, you should stay in a different room from other people in your home. Also, you should use a separate bathroom, if available.  Avoid sharing household items You should not share dishes, drinking glasses, cups, eating utensils, towels, bedding, or other items with other people in your home. After using these items, you should wash them thoroughly with soap and water.  Cover your coughs and sneezes Cover your mouth and nose with a tissue when you cough or sneeze, or you can cough or sneeze into your sleeve. Throw used tissues in a lined trash can, and immediately wash your hands with soap and water for at least 20 seconds or use an alcohol-based hand rub.  Wash your [employer]hands Wash your hands often and thoroughly with soap and water for at least 20 seconds. You can use an alcohol-based hand sanitizer if soap and water are not available and if your hands are not visibly dirty. Avoid touching your eyes, nose, and mouth with unwashed hands.   Prevention Steps for Caregivers and Household Members of Individuals Confirmed to have, or Being Evaluated for, COVID-19 Infection Being Cared for in the Home  If you live with, or provide care at home for, a person confirmed to have, or being evaluated for, COVID-19 infection please follow these guidelines to prevent infection:  Follow healthcare providers instructions Make sure that you understand and can help the patient follow any healthcare provider instructions for all care.  Provide for the patients basic needs You should help the patient with basic needs in the home and provide support for getting  groceries, prescriptions, and other personal needs.  Monitor the patients symptoms If they are getting sicker, call his or her medical provider and tell them that the patient has, or is being evaluated for, COVID-19 infection. This will help the healthcare providers office take steps to keep other  people from getting infected. Ask the healthcare provider to call the local or state health department.  Limit the number of people who have contact with the patient  If possible, have only one caregiver for the patient.  Other household members should stay in another home or place of residence. If this is not possible, they should stay  in another room, or be separated from the patient as much as possible. Use a separate bathroom, if available.  Restrict visitors who do not have an essential need to be in the home.  Keep older adults, very young children, and other sick people away from the patient Keep older adults, very young children, and those who have compromised immune systems or chronic health conditions away from the patient. This includes people with chronic heart, lung, or kidney conditions, diabetes, and cancer.  Ensure good ventilation Make sure that shared spaces in the home have good air flow, such as from an air conditioner or an opened window, weather permitting.  Wash your hands often  Wash your hands often and thoroughly with soap and water for at least 20 seconds. You can use an alcohol based hand sanitizer if soap and water are not available and if your hands are not visibly dirty.  Avoid touching your eyes, nose, and mouth with unwashed hands.  Use disposable paper towels to dry your hands. If not available, use dedicated cloth towels and replace them when they become wet.  Wear a facemask and gloves  Wear a disposable facemask at all times in the room and gloves when you touch or have contact with the patients blood, body fluids, and/or secretions or excretions, such as sweat, saliva, sputum, nasal mucus, vomit, urine, or feces.  Ensure the mask fits over your nose and mouth tightly, and do not touch it during use.  Throw out disposable facemasks and gloves after using them. Do not reuse.  Wash your hands immediately after removing your facemask and  gloves.  If your personal clothing becomes contaminated, carefully remove clothing and launder. Wash your hands after handling contaminated clothing.  Place all used disposable facemasks, gloves, and other waste in a lined container before disposing them with other household waste.  Remove gloves and wash your hands immediately after handling these items.  Do not share dishes, glasses, or other household items with the patient  Avoid sharing household items. You should not share dishes, drinking glasses, cups, eating utensils, towels, bedding, or other items with a patient who is confirmed to have, or being evaluated for, COVID-19 infection.  After the person uses these items, you should wash them thoroughly with soap and water.  Wash laundry thoroughly  Immediately remove and wash clothes or bedding that have blood, body fluids, and/or secretions or excretions, such as sweat, saliva, sputum, nasal mucus, vomit, urine, or feces, on them.  Wear gloves when handling laundry from the patient.  Read and follow directions on labels of laundry or clothing items and detergent. In general, wash and dry with the warmest temperatures recommended on the label.  Clean all areas the individual has used often  Clean all touchable surfaces, such as counters, tabletops, doorknobs, bathroom fixtures, toilets, phones, keyboards, tablets, and bedside tables,  every day. Also, clean any surfaces that may have blood, body fluids, and/or secretions or excretions on them.  Wear gloves when cleaning surfaces the patient has come in contact with.  Use a diluted bleach solution (e.g., dilute bleach with 1 part bleach and 10 parts water) or a household disinfectant with a label that says EPA-registered for coronaviruses. To make a bleach solution at home, add 1 tablespoon of bleach to 1 quart (4 cups) of water. For a larger supply, add  cup of bleach to 1 gallon (16 cups) of water.  Read labels of cleaning  products and follow recommendations provided on product labels. Labels contain instructions for safe and effective use of the cleaning product including precautions you should take when applying the product, such as wearing gloves or eye protection and making sure you have good ventilation during use of the product.  Remove gloves and wash hands immediately after cleaning.  Monitor yourself for signs and symptoms of illness Caregivers and household members are considered close contacts, should monitor their health, and will be asked to limit movement outside of the home to the extent possible. Follow the monitoring steps for close contacts listed on the symptom monitoring form.   ? If you have additional questions, contact your local health department or call the epidemiologist on call at [phone] (available 24/7). ? This guidance is subject to change. For the most up-to-date guidance from [facility], please refer to their website: [email or web address]

## 2019-10-04 NOTE — ED Triage Notes (Signed)
Patient reports SOB on exertion with intermittent cough x6 days. 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

## 2019-10-04 NOTE — ED Provider Notes (Signed)
[facility] DEPT Provider Note   CSN: [id number] Arrival date & time: 10/04/19  1221    History   Chief Complaint Chief Complaint  Patient presents with  . Shortness of Breath   HPI JULLIETTE FRENTZ is a 38 y.o. female with past medical history significant for asthma, anxiety who presents for evaluation of shortness of breath.  Patient states she has had shortness of breath, worsening with exertion over the last 6 days.  Patient states she is also had a nonproductive cough.  States she does have history of asthma however this does not feel similar.  States she does have some mild loss of taste and smell.  No known Covid exposures.  Her symptoms started last Wednesday.  She had a negative Covid test on Saturday.  Patient states she has had some mild chest pain which is not pleuritic in nature.  Patient states it feels like a "aching."  Denies fever, chills, nausea, vomiting, hemoptysis, abdominal pain, constipation, dysuria, lateral weakness.  Patient states she did have 2 episodes of diarrhea over the last week without melena or hematochezia. She is been tolerating p.o. intake at home without difficulty.  She called her PCP who told her to present to emergency department for further evaluation. Denies additional aggravating or alleviating factors. Denies prior hx of PE or DVT. No recent surgery or immobilization.  She does admit to oral OCP use.  History obtained from patient and past medical records.  No interpreter is used.     HPI  Past Medical History:  Diagnosis Date  . Anxiety    hx - no meds  . Asthma    prn -seasonal use of inhaler only - rarely use  . Breech presentation 11/22/2014  . Cesarean delivery delivered 12/26/2014  . Chest tightness    anxiety related  . Cramping affecting pregnancy, antepartum   . Depression    hx - no meds   . Dysrhythmia    Hx tachycardia - controlled with lopressor, no problems  . Fibromyalgia 2007   no meds  .  GBS (group B streptococcus) UTI complicating pregnancy   . History of asthma   . 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 IBS (irritable bowel syndrome)    diet controlled  . Inactive TB 2011   chest xray neg  . LGSIL of cervix of undetermined significance   . Nausea    patient denies  . Osteoarthritis   . Palpitations 08/30/2014  . Polycystic ovarian syndrome 2011   Borderline A1C  . Postpartum care following cesarean delivery (2/2) 12/26/2014  . Tachycardia    controlled with lopressor, no problems  . Third trimester pregnancy at less than 36 weeks 11/22/2014    Patient Active Problem List   Diagnosis Date Noted  . Postpartum care following cesarean delivery (2/2) 12/26/2014  . Cesarean delivery delivered 12/26/2014  . Breech presentation 11/22/2014  . Palpitations 08/30/2014    Past Surgical History:  Procedure Laterality Date  . CESAREAN SECTION N/A 12/26/2014   Procedure: Primary CESAREAN SECTION;  Surgeon: Lovenia Kim, MD;  Location: [facility];  Service: Obstetrics;  Laterality: N/A;  EDD: 12/23/14  . COLPOSCOPY W/ BIOPSY / CURETTAGE    . DILATION AND EVACUATION N/A 12/26/2014   Procedure: DILATATION AND EVACUATION with Bakri Balloon Placement;  Surgeon: Lovenia Kim, MD;  Location: [facility];  Service: Gynecology;  Laterality: N/A;  . LAPAROSCOPIC LYSIS OF ADHESIONS N/A 02/27/2014   Procedure: LAPAROSCOPIC LYSIS OF ADHESIONS;  Surgeon: Lenoard Aden, MD;  Location: [facility];  Service: Gynecology;  Laterality: N/A;  . LAPAROSCOPY N/A 02/27/2014   Procedure: LAPAROSCOPY DIAGNOSTIC;  Surgeon: Lenoard Aden, MD;  Location: [facility];  Service: Gynecology;  Laterality: N/A;  . MANDIBLE FRACTURE SURGERY  03/2001   x 7 jaw surgeries. TMJ  . NASAL SEPTUM SURGERY  03/2001  . tear duct surgery     at 38 months old - right eye  . TONSILLECTOMY AND ADENOIDECTOMY  2000  . WISDOM TOOTH EXTRACTION       OB History    Gravida  1   Para  1   Term  1   Preterm      AB      Living  1      SAB      TAB      Ectopic      Multiple      Live Births  1            Home Medications    Prior to Admission medications   Medication Sig Start Date End Date Taking? Authorizing Provider  albuterol (PROVENTIL HFA;VENTOLIN HFA) 108 (90 BASE) MCG/ACT inhaler Inhale 2 puffs into the lungs every 6 (six) hours as needed for wheezing or shortness of breath.    [provider]  cyclobenzaprine (FLEXERIL) 10 MG tablet Take 10 mg by mouth daily. 09/17/18   [provider]  gabapentin (NEURONTIN) 100 MG capsule Take 300 mg by mouth at bedtime. 09/17/18   [provider]  HYDROcodone-acetaminophen (NORCO) 7.5-325 MG tablet Take 1 tablet by mouth every 6 (six) hours as needed for pain. 11/05/16   [provider]  hydroxychloroquine (PLAQUENIL) 200 MG tablet Take 200 mg by mouth daily. 09/24/18   [provider]  JUNEL FE 24 1-20 MG-MCG(24) tablet Take 1 tablet by mouth as directed. 09/15/18   [provider]  metoprolol succinate (TOPROL-XL) 25 MG 24 hr tablet TAKE 1 TABLET BY MOUTH  DAILY. 08/31/19   Marinus Maw, MD  Multiple Vitamins-Minerals (MULTIVITAMIN ADULT PO) Take 1 tablet by mouth daily.    [provider]  nortriptyline (PAMELOR) 50 MG capsule Take 50 mg by mouth at bedtime.    [provider]  QVAR 40 MCG/ACT inhaler Inhale 2 puffs into the lungs 2 (two) times daily as needed for shortness of breath or wheezing. 10/20/16   [provider]  sertraline (ZOLOFT) 100 MG tablet Take 100 mg by mouth daily.    [provider]    Family History Family History  Problem Relation Age of Onset  . Fibromyalgia Mother   . Thrombocytopenia Mother        had spleen removed  . Hepatitis C Mother   . Osteoporosis Mother   . Anxiety disorder Mother   . Hyperlipidemia Father   . Narcolepsy Father   . Heart attack Maternal Grandmother   . Heart attack Maternal Grandfather   . Heart attack  Paternal Grandmother   . Osteoporosis Paternal Grandmother   . Heart attack Paternal Grandfather   . Diabetes Paternal Uncle        DM    Social History Social History   Tobacco Use  . Smoking status: Never Smoker  . Smokeless tobacco: Never Used  Substance Use Topics  . Alcohol use: Yes    Comment: 1 glass of wine/week but none with preganacy  . Drug use: No     Allergies   Erythromycin,  Latex, Zithromax [azithromycin], Augmentin [amoxicillin-pot clavulanate], and Penicillins   Review of Systems Review of Systems  Constitutional: Negative.   HENT: Negative.        Loss of taste and smell  Respiratory: Positive for cough and shortness of breath. Negative for apnea, choking, chest tightness, wheezing and stridor.   Cardiovascular: Positive for chest pain (Dull aching).  Gastrointestinal: Positive for diarrhea (2 episodes over last week). Negative for abdominal distention, abdominal pain, anal bleeding, blood in stool, constipation, nausea, rectal pain and vomiting.  Genitourinary: Negative.   Musculoskeletal: Negative.   Skin: Negative.   Neurological: Negative.   All other systems reviewed and are negative.    Physical Exam Updated Vital Signs BP (!) 152/114   Pulse 90   Temp 98.2 F (36.8 C) (Oral)   Resp 14   Ht 5\' 4"  (1.626 m)   Wt 73.9 kg   SpO2 100%   BMI 27.98 kg/m   Physical Exam Vitals signs and nursing note reviewed.  Constitutional:      General: She is not in acute distress.    Appearance: She is not ill-appearing, toxic-appearing or diaphoretic.  HENT:     Head: Normocephalic and atraumatic.     Jaw: There is normal jaw occlusion.     Right Ear: Tympanic membrane, ear canal and external ear normal. There is no impacted cerumen. No hemotympanum. Tympanic membrane is not injected, scarred, perforated, erythematous, retracted or bulging.     Left Ear: Tympanic membrane, ear canal and external ear normal. There is no impacted cerumen. No  hemotympanum. Tympanic membrane is not injected, scarred, perforated, erythematous, retracted or bulging.     Ears:     Comments: No Mastoid tenderness.    Nose:     Comments: Clear rhinorrhea and congestion to bilateral nares.  No sinus tenderness.    Mouth/Throat:     Comments: Posterior oropharynx clear.  Mucous membranes moist.  Tonsils without erythema or exudate.  Uvula midline without deviation.  No evidence of PTA or RPA.  No drooling, dysphasia or trismus.  Phonation normal. Neck:     Trachea: Trachea and phonation normal.     Meningeal: Brudzinski's sign and Kernig's sign absent.     Comments: No Neck stiffness or neck rigidity.  No meningismus.  No cervical lymphadenopathy. Cardiovascular:     Rate and Rhythm: Tachycardia present.     Comments: No murmurs rubs or gallops. Pulmonary:     Comments: Clear to auscultation bilaterally without wheeze, rhonchi or rales.  No accessory muscle usage.  Able speak in full sentences. Abdominal:     Comments: Soft, nontender without rebound or guarding.  No CVA tenderness.  Musculoskeletal:     Comments: Moves all 4 extremities without difficulty.  Lower extremities without edema, erythema or warmth.  Skin:    Comments: Brisk capillary refill.  No rashes or lesions.  Neurological:     Mental Status: She is alert.     Comments: Ambulatory in department without difficulty.  Cranial nerves II through XII grossly intact.  No facial droop.  No aphasia.    ED Treatments / Results  Labs (all labs ordered are listed, but only abnormal results are displayed) Labs Reviewed  CBC WITH DIFFERENTIAL/PLATELET  BASIC METABOLIC PANEL  D-DIMER, QUANTITATIVE (NOT [facility])  I-STAT BETA HCG BLOOD, ED ([facility], [facility], [facility] ONLY)  TROPONIN I (HIGH SENSITIVITY)    EKG None  Radiology Dg Chest 2 View  Result Date: 10/04/2019 CLINICAL DATA:  Centralized chest  pressure. EXAM: CHEST - 2 VIEW COMPARISON:  10/28/2016 FINDINGS: The heart size and mediastinal  contours are within normal limits. Both lungs are clear. The visualized skeletal structures are unremarkable. IMPRESSION: No active cardiopulmonary disease. Electronically Signed   By: Donzetta Kohut M.D.   On: 10/04/2019 14:01    Procedures Procedures (including critical care time)  Medications Ordered in ED Medications  sodium chloride 0.9 % bolus 1,000 mL (has no administration in time range)   Initial Impression / Assessment and Plan / ED Course  I have reviewed the triage vital signs and the nursing notes.  Pertinent labs & imaging results that were available during my care of the patient were reviewed by me and considered in my medical decision making (see chart for details).  38 year old female presents for evaluation of SOB. Afebrile, non septic, non ill appearing. SOB worse with exertion. Non productive cough. Admits to chest "aching" No radiation of pain, diaphoresis, N/V, dizziness. No evidence of DVT on exam however she is tachycardic to 105 and on oral OCP. Cannot PERC. Will obtained labs. Chest xray from triage without infiltrates, cardiomegaly, pulmonary edema, pneumothorax. EKG with tachycardia no STEMI.  Heart and lungs clear.  She has no tachypnea or hypoxia in room however she is mildly tachycardic. Does not have evidence of fluid overload on exam.  If labs negative would retest for COVID and treat as UR infection with symptomatic management.   Care transferred to [facility], PA-C who will follow up on labs. She will determine ultimate treatment, plan and disposition.       Final Clinical Impressions(s) / ED Diagnoses   Final diagnoses:  Cough  SOB (shortness of breath)    ED Discharge Orders    None       Oliveah Zwack A, PA-C 10/04/19 1458    Virgina Norfolk, DO 10/04/19 1501

## 2019-10-05 LAB — SARS CORONAVIRUS 2 (TAT 6-24 HRS): SARS Coronavirus 2: NEGATIVE

## 2019-10-17 NOTE — Progress Notes (Signed)
PCP:  Soundra PilonBrake, Andrew R, FNP Primary Cardiologist: No primary care provider on file. Electrophysiologist: Dr. Eliberto Ivoryaylor  Kim Kennedy is a 38 y.o. female with past medical history of palpitations who presents today for routine electrophysiology followup. They are seen for Dr. Ladona Ridgelaylor  She was seen in the ED 10/04/2019 with about a week of SOB. She reported transient loss of taste/smell about prior to that, and has now tested negative for COVID twice. She was encouraged to follow up with cardiology for SOB.   She is feeling much better.  She was having more palpitations and increased her Toprol to 50 mg daily, as she had previously been on and tolerated that does.  She has had no further complaints of SOB or tachypalpitations. No chest pain, lightheadedness, or dizziness.   Past Medical History:  Diagnosis Date  . Anxiety    hx - no meds  . Asthma    prn -seasonal use of inhaler only - rarely use  . Breech presentation 11/22/2014  . Cesarean delivery delivered 12/26/2014  . Chest tightness    anxiety related  . Cramping affecting pregnancy, antepartum   . Depression    hx - no meds   . Dysrhythmia    Hx tachycardia - controlled with lopressor, no problems  . Fibromyalgia 2007   no meds  . GBS (group B streptococcus) UTI complicating pregnancy   . History of asthma   . 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 IBS (irritable bowel syndrome)    diet controlled  . Inactive TB 2011   chest xray neg  . LGSIL of cervix of undetermined significance   . Nausea    patient denies  . Osteoarthritis   . Palpitations 08/30/2014  . Polycystic ovarian syndrome 2011   Borderline A1C  . Postpartum care following cesarean delivery (2/2) 12/26/2014  . Tachycardia    controlled with lopressor, no problems  . Third trimester pregnancy at less than 36 weeks 11/22/2014   Past Surgical History:  Procedure Laterality Date  . CESAREAN SECTION N/A 12/26/2014   Procedure: Primary CESAREAN SECTION;  Surgeon:  Lenoard Adenichard J Taavon, MD;  Location: [facility];  Service: Obstetrics;  Laterality: N/A;  EDD: 12/23/14  . COLPOSCOPY W/ BIOPSY / CURETTAGE    . DILATION AND EVACUATION N/A 12/26/2014   Procedure: DILATATION AND EVACUATION with Bakri Balloon Placement;  Surgeon: Lenoard Adenichard J Taavon, MD;  Location: [facility];  Service: Gynecology;  Laterality: N/A;  . LAPAROSCOPIC LYSIS OF ADHESIONS N/A 02/27/2014   Procedure: LAPAROSCOPIC LYSIS OF ADHESIONS;  Surgeon: Lenoard Adenichard J Taavon, MD;  Location: [facility];  Service: Gynecology;  Laterality: N/A;  . LAPAROSCOPY N/A 02/27/2014   Procedure: LAPAROSCOPY DIAGNOSTIC;  Surgeon: Lenoard Adenichard J Taavon, MD;  Location: [facility];  Service: Gynecology;  Laterality: N/A;  . MANDIBLE FRACTURE SURGERY  03/2001   x 7 jaw surgeries. TMJ  . NASAL SEPTUM SURGERY  03/2001  . tear duct surgery     at 5718 months old - right eye  . TONSILLECTOMY AND ADENOIDECTOMY  2000  . WISDOM TOOTH EXTRACTION      Current Outpatient Medications  Medication Sig Dispense Refill  . albuterol (PROVENTIL HFA;VENTOLIN HFA) 108 (90 BASE) MCG/ACT inhaler Inhale 2 puffs into the lungs every 6 (six) hours as needed for wheezing or shortness of breath.    . cyclobenzaprine (FLEXERIL) 10 MG tablet Take 10 mg by mouth daily.    Colleen Can. JUNEL 1.5/30 1.5-30 MG-MCG tablet Take 1 tablet by mouth daily.    .Marland Kitchen  leflunomide (ARAVA) 20 MG tablet Take 20 mg by mouth daily.    . Magnesium 200 MG TABS Take 600 mg by mouth at bedtime.    . metoprolol succinate (TOPROL-XL) 25 MG 24 hr tablet TAKE 1 TABLET BY MOUTH  DAILY. (Patient taking differently: Take 25 mg by mouth at bedtime. ) 90 tablet 0  . Multiple Vitamins-Minerals (MULTIVITAMIN ADULT PO) Take 1 tablet by mouth daily.    Marland Kitchen oxyCODONE-acetaminophen (PERCOCET/ROXICET) 5-325 MG tablet Take 1 tablet by mouth every 6 (six) hours as needed.    . Probiotic Product (PROBIOTIC DAILY PO) Take 1 tablet by mouth daily.    . sertraline (ZOLOFT) 100 MG tablet Take 50 mg by mouth daily.      No current  facility-administered medications for this visit.     Allergies  Allergen Reactions  . Erythromycin Anaphylaxis  . Latex Swelling and Rash    Swelling at contact site  . Zithromax [Azithromycin] Anaphylaxis and Other (See Comments)    Tightness in chest  . Augmentin [Amoxicillin-Pot Clavulanate] Diarrhea and Nausea And Vomiting  . Penicillins Diarrhea and Nausea And Vomiting    Social History   Socioeconomic History  . Marital status: Married    Spouse name: Not on file  . Number of children: Not on file  . Years of education: Not on file  . Highest education level: Not on file  Occupational History  . Not on file  Social Needs  . Financial resource strain: Not on file  . Food insecurity    Worry: Not on file    Inability: Not on file  . Transportation needs    Medical: Not on file    Non-medical: Not on file  Tobacco Use  . Smoking status: Never Smoker  . Smokeless tobacco: Never Used  Substance and Sexual Activity  . Alcohol use: Yes    Comment: 1 glass of wine/week but none with preganacy  . Drug use: No  . Sexual activity: Yes    Partners: Male    Birth control/protection: None  Lifestyle  . Physical activity    Days per week: Not on file    Minutes per session: Not on file  . Stress: Not on file  Relationships  . Social Musician on phone: Not on file    Gets together: Not on file    Attends religious service: Not on file    Active member of club or organization: Not on file    Attends meetings of clubs or organizations: Not on file    Relationship status: Not on file  . Intimate partner violence    Fear of current or ex partner: Not on file    Emotionally abused: Not on file    Physically abused: Not on file    Forced sexual activity: Not on file  Other Topics Concern  . Not on file  Social History Narrative  . Not on file     Review of Systems: General: No chills, fever, night sweats or weight changes  Cardiovascular:  No chest  pain, dyspnea on exertion, edema, orthopnea, palpitations, paroxysmal nocturnal dyspnea Dermatological: No rash, lesions or masses Respiratory: No cough, dyspnea Urologic: No hematuria, dysuria Abdominal: No nausea, vomiting, diarrhea, bright red blood per rectum, melena, or hematemesis Neurologic: No visual changes, weakness, changes in mental status All other systems reviewed and are otherwise negative except as noted above.  Physical Exam: There were no vitals filed for this visit.  GEN- The  patient is well appearing, alert and oriented x 3 today.   HEENT: normocephalic, atraumatic; sclera clear, conjunctiva pink; hearing intact; oropharynx clear; neck supple, no JVP Lymph- no cervical lymphadenopathy Lungs- Clear to ausculation bilaterally, normal work of breathing.  No wheezes, rales, rhonchi Heart- Regular rate and rhythm, no murmurs, rubs or gallops, PMI not laterally displaced GI- soft, non-tender, non-distended, bowel sounds present, no hepatosplenomegaly Extremities- no clubbing, cyanosis, or edema; DP/PT/radial pulses 2+ bilaterally MS- no significant deformity or atrophy Skin- warm and dry, no rash or lesion Psych- euthymic mood, full affect Neuro- strength and sensation are intact  EKG is not ordered. Personal review of EKG from 10/03/2001 shows NSR at 88 bpm, personally reviewed  Assessment and Plan:  1. Palpitations Reasonably well controlled when she avoids caffeine. Continue toprol. Will increase to 50 mg daily as patient is tolerating well.  No documented SVT  2. Sinus tachycardia Stable. No indication for further monitoring at this time.  Recommended limiting caffeine intake, and stress.  Shirley Friar, PA-C  10/17/19 2:33 PM

## 2019-10-19 ENCOUNTER — Ambulatory Visit (INDEPENDENT_AMBULATORY_CARE_PROVIDER_SITE_OTHER): Payer: 59 | Admitting: Student

## 2019-10-19 ENCOUNTER — Other Ambulatory Visit: Payer: Self-pay

## 2019-10-19 VITALS — BP 110/74 | HR 96 | Ht 64.0 in | Wt 166.0 lb

## 2019-10-19 DIAGNOSIS — R000 Tachycardia, unspecified: Secondary | ICD-10-CM | POA: Diagnosis not present

## 2019-10-19 DIAGNOSIS — R002 Palpitations: Secondary | ICD-10-CM | POA: Diagnosis not present

## 2019-10-19 MED ORDER — METOPROLOL SUCCINATE ER 50 MG PO TB24: 50.0000 mg | ORAL_TABLET | Freq: Every day | ORAL | 3 refills | Status: DC

## 2019-10-19 NOTE — Patient Instructions (Signed)
Medication Instructions:   START TAKING : METOPROLOL 50 MG ONCE A DAY  *If you need a refill on your cardiac medications before your next appointment, please call your pharmacy*  Lab Work: NONE ORDERED  TODAY   If you have labs (blood work) drawn today and your tests are completely normal, you will receive your results only by: Marland Kitchen MyChart Message (if you have MyChart) OR . A paper copy in the mail If you have any lab test that is abnormal or we need to change your treatment, we will call you to review the results.  Testing/Procedures: NONE ORDERED  TODAY   Follow-Up: At [facility], you and your health needs are our priority.  As part of our continuing mission to provide you with exceptional heart care, we have created designated Provider Care Teams.  These Care Teams include your primary Cardiologist (physician) and Advanced Practice Providers (APPs -  Physician Assistants and Nurse Practitioners) who all work together to provide you with the care you need, when you need it.  Your next appointment:   1 year(s)  The format for your next appointment:   In Person  Provider:   You may see Dr. Lovena Le  or one of the following Advanced Practice Providers on your designated Care Team:    Chanetta Marshall, NP  Tommye Standard, PA-C  Legrand Como "Oda Kilts, [place]   Other Instructions

## 2020-04-02 ENCOUNTER — Other Ambulatory Visit: Payer: Self-pay | Admitting: Obstetrics and Gynecology

## 2020-04-10 NOTE — Patient Instructions (Addendum)
DUE TO COVID-19 ONLY ONE VISITOR IS ALLOWED IN WAITING ROOM (VISITOR WILL HAVE A TEMPERATURE CHECK ON ARRIVAL AND MUST WEAR A FACE MASK THE ENTIRE TIME.)  ONCE YOU ARE ADMITTED TO YOUR PRIVATE ROOM, THE SAME ONE VISITOR IS ALLOWED TO VISIT DURING VISITING HOURS ONLY.  Your COVID swab testing is scheduled for: 04/16/20  at 10:00 am , You must self quarantine after your testing per handout given to you at the testing site.  ([facility] up testing enter pre-surgical testing line)    Your procedure is scheduled on:04/19/20  Report to [place] AT: 5:45  A. M.   Call this number if you have problems the morning of surgery:[phone].   OUR ADDRESS IS [place].  WE ARE LOCATED IN THE [place]                                   [facility].                                     REMEMBER:   DO NOT EAT FOOD OR DRINK LIQUIDS AFTER MIDNIGHT .    BRUSH YOUR TEETH THE MORNING OF SURGERY.  TAKE THESE MEDICATIONS MORNING OF SURGERY WITH A SIP OF WATER:  Sertraline.Use inhalers as usual.  DO NOT WEAR JEWERLY, MAKE UP, OR NAIL POLISH.  DO NOT WEAR LOTIONS, POWDERS, PERFUMES/COLOGNE OR DEODORANT.  DO NOT SHAVE FOR 24 HOURS PRIOR TO DAY OF SURGERY.  MEN MAY SHAVE FACE AND NECK.  CONTACTS, GLASSES, OR DENTURES MAY NOT BE WORN TO SURGERY.                                    [facility] IS NOT RESPONSIBLE  FOR ANY BELONGINGS.         YOU MAY BRING A SMALL OVERNIGHT BAG   BRING ALL PRESCRIPTION MEDICATIONS WITH YOU THE DAY OF SURGERY IN ORIGINAL CONTAINERS                                                               .[facility] - Preparing for Surgery Before surgery, you can play an important role.  Because skin is not sterile, your skin needs to be as free of germs as possible.  You can reduce the number of germs on your skin by washing with CHG (chlorahexidine gluconate) soap before surgery.  CHG is an antiseptic  cleaner which kills germs and bonds with the skin to continue killing germs even after washing. Please DO NOT use if you have an allergy to CHG or antibacterial soaps.  If your skin becomes reddened/irritated stop using the CHG and inform your nurse when you arrive at Short Stay. Do not shave (including legs and underarms) for at least 48 hours prior to the first CHG shower.  You may shave your face/neck. Please follow these instructions carefully:  1.  Shower with CHG Soap the night before surgery and the  morning of Surgery.  2.  If you  choose to wash your hair, wash your hair first as usual with your  normal  shampoo.  3.  After you shampoo, rinse your hair and body thoroughly to remove the  shampoo.                           4.  Use CHG as you would any other liquid soap.  You can apply chg directly  to the skin and wash                       Gently with a scrungie or clean washcloth.  5.  Apply the CHG Soap to your body ONLY FROM THE NECK DOWN.   Do not use on face/ open                           Wound or open sores. Avoid contact with eyes, ears mouth and genitals (private parts).                       Wash face,  Genitals (private parts) with your normal soap.             6.  Wash thoroughly, paying special attention to the area where your surgery  will be performed.  7.  Thoroughly rinse your body with warm water from the neck down.  8.  DO NOT shower/wash with your normal soap after using and rinsing off  the CHG Soap.                9.  Pat yourself dry with a clean towel.            10.  Wear clean pajamas.            11.  Place clean sheets on your bed the night of your first shower and do not  sleep with pets. Day of Surgery : Do not apply any lotions/deodorants the morning of surgery.  Please wear clean clothes to the hospital/surgery center.  FAILURE TO FOLLOW THESE INSTRUCTIONS MAY RESULT IN THE CANCELLATION OF YOUR SURGERY PATIENT  SIGNATURE_________________________________  NURSE SIGNATURE__________________________________  ________________________________________________________________________

## 2020-04-11 ENCOUNTER — Other Ambulatory Visit: Payer: Self-pay

## 2020-04-11 ENCOUNTER — Encounter (HOSPITAL_COMMUNITY)
Admission: RE | Admit: 2020-04-11 | Discharge: 2020-04-11 | Disposition: A | Discharge status: Home / Self Care | Payer: 59 | Source: Ambulatory Visit | Attending: Obstetrics and Gynecology | Admitting: Obstetrics and Gynecology

## 2020-04-11 ENCOUNTER — Encounter (HOSPITAL_COMMUNITY): Payer: Self-pay

## 2020-04-11 DIAGNOSIS — Z01812 Encounter for preprocedural laboratory examination: Secondary | ICD-10-CM | POA: Diagnosis not present

## 2020-04-11 HISTORY — DX: Prediabetes: R73.03

## 2020-04-11 HISTORY — DX: Pneumonia, unspecified organism: J18.9

## 2020-04-11 NOTE — Progress Notes (Signed)
PCP - Peri Maris. FNP Cardiologist - Maxine Glenn [facility] LOV: 10/19/19  Chest x-ray - 10/04/19. EPIC EKG - 10/04/19 EPIC Stress Test -  ECHO -  Cardiac Cath -   Sleep Study -  CPAP -   Fasting Blood Sugar -  Checks Blood Sugar _____ times a day  Blood Thinner Instructions: Aspirin Instructions: Last Dose:  Anesthesia review: Hx. Tachycardia,HTN  Patient denies shortness of breath, fever, cough and chest pain at PAT appointment   Patient verbalized understanding of instructions that were given to them at the PAT appointment. Patient was also instructed that they will need to review over the PAT instructions again at home before surgery.

## 2020-04-13 ENCOUNTER — Encounter (HOSPITAL_COMMUNITY)
Admission: RE | Admit: 2020-04-13 | Discharge: 2020-04-13 | Disposition: A | Discharge status: Home / Self Care | Payer: 59 | Source: Ambulatory Visit | Attending: Obstetrics and Gynecology | Admitting: Obstetrics and Gynecology

## 2020-04-13 ENCOUNTER — Other Ambulatory Visit: Payer: Self-pay

## 2020-04-13 DIAGNOSIS — Z01812 Encounter for preprocedural laboratory examination: Secondary | ICD-10-CM | POA: Diagnosis not present

## 2020-04-13 LAB — CBC
HCT: 42.2 % (ref 36.0–46.0)
Hemoglobin: 13.4 g/dL (ref 12.0–15.0)
MCH: 29.8 pg (ref 26.0–34.0)
MCHC: 31.8 g/dL (ref 30.0–36.0)
MCV: 93.8 fL (ref 80.0–100.0)
Platelets: 279 10*3/uL (ref 150–400)
RBC: 4.5 MIL/uL (ref 3.87–5.11)
RDW: 12.9 % (ref 11.5–15.5)
WBC: 5.5 10*3/uL (ref 4.0–10.5)
nRBC: 0 % (ref 0.0–0.2)

## 2020-04-13 LAB — ABO/RH: ABO/RH(D): A POS

## 2020-04-16 ENCOUNTER — Other Ambulatory Visit (HOSPITAL_COMMUNITY)
Admission: RE | Admit: 2020-04-16 | Discharge: 2020-04-16 | Disposition: A | Discharge status: Home / Self Care | Payer: 59 | Source: Ambulatory Visit | Attending: Obstetrics and Gynecology | Admitting: Obstetrics and Gynecology

## 2020-04-16 DIAGNOSIS — Z01812 Encounter for preprocedural laboratory examination: Secondary | ICD-10-CM | POA: Diagnosis present

## 2020-04-16 DIAGNOSIS — Z20822 Contact with and (suspected) exposure to covid-19: Secondary | ICD-10-CM | POA: Diagnosis not present

## 2020-04-16 LAB — SARS CORONAVIRUS 2 (TAT 6-24 HRS): SARS Coronavirus 2: NEGATIVE

## 2020-04-18 NOTE — H&P (Signed)
Kennedy Kennedy is an 39 y.o. female. History of refractory dysmenorrhea for definitive therapy.  Pertinent Gynecological History: Menses: with severe dysmenorrhea Bleeding: dysfunctional uterine bleeding Contraception: OCP (estrogen/progesterone) DES exposure: denies Blood transfusions: none Sexually transmitted diseases: no past history Previous GYN Procedures: DNC  Last mammogram: na Date: na Last pap: abnormal: LGSIL Date: 2020 OB History: G1, P1   Menstrual History: Menarche age: 44 No LMP recorded. (Menstrual status: Oral contraceptives).    Past Medical History:  Diagnosis Date  . Anxiety    hx - no meds  . Asthma    prn -seasonal use of inhaler only - rarely use  . Breech presentation 11/22/2014  . Cesarean delivery delivered 12/26/2014  . Chest tightness    anxiety related  . Cramping affecting pregnancy, antepartum   . Depression    hx - no meds   . Dysrhythmia    Hx tachycardia - controlled with lopressor, no problems  . Fibromyalgia 2007   no meds  . GBS (group B streptococcus) UTI complicating pregnancy   . History of asthma   . 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 IBS (irritable bowel syndrome)    diet controlled  . Inactive TB 2011   chest xray neg  . LGSIL of cervix of undetermined significance   . Nausea    patient denies  . Osteoarthritis   . Palpitations 08/30/2014  . Pneumonia   . Polycystic ovarian syndrome 2011   Borderline A1C  . Postpartum care following cesarean delivery (2/2) 12/26/2014  . Pre-diabetes    diet  . Tachycardia    controlled with lopressor, no problems  . Third trimester pregnancy at less than 36 weeks 11/22/2014    Past Surgical History:  Procedure Laterality Date  . CESAREAN SECTION N/A 12/26/2014   Procedure: Primary CESAREAN SECTION;  Surgeon: Kennedy Kim, MD;  Location: [facility];  Service: Obstetrics;  Laterality: N/A;  EDD: 12/23/14  . COLPOSCOPY W/ BIOPSY / CURETTAGE    . DILATION AND EVACUATION N/A 12/26/2014   Procedure: DILATATION AND EVACUATION with Bakri Balloon Placement;  Surgeon: Kennedy Kim, MD;  Location: [facility];  Service: Gynecology;  Laterality: N/A;  . LAPAROSCOPIC LYSIS OF ADHESIONS N/A 02/27/2014   Procedure: LAPAROSCOPIC LYSIS OF ADHESIONS;  Surgeon: Kennedy Kim, MD;  Location: [facility];  Service: Gynecology;  Laterality: N/A;  . LAPAROSCOPY N/A 02/27/2014   Procedure: LAPAROSCOPY DIAGNOSTIC;  Surgeon: Kennedy Kim, MD;  Location: [facility];  Service: Gynecology;  Laterality: N/A;  . MANDIBLE FRACTURE SURGERY  03/2001   x 7 jaw surgeries. TMJ  . NASAL SEPTUM SURGERY  03/2001  . tear duct surgery     at 34 months old - right eye  . TONSILLECTOMY AND ADENOIDECTOMY  2000  . WISDOM TOOTH EXTRACTION      Family History  Problem Relation Age of Onset  . Fibromyalgia Mother   . Thrombocytopenia Mother        had spleen removed  . Hepatitis C Mother   . Osteoporosis Mother   . Anxiety disorder Mother   . Hyperlipidemia Father   . Narcolepsy Father   . Heart attack Maternal Grandmother   . Heart attack Maternal Grandfather   . Heart attack Paternal Grandmother   . Osteoporosis Paternal Grandmother   . Heart attack Paternal Grandfather   . Diabetes Paternal Uncle        DM    Social History:  reports that she has never smoked. She has  never used smokeless tobacco. She reports current alcohol use. She reports that she does not use drugs.  Allergies:  Allergies  Allergen Reactions  . Erythromycin Anaphylaxis  . Latex Swelling and Rash    Swelling at contact site  . Zithromax [Azithromycin] Anaphylaxis and Other (See Comments)    Tightness in chest  . Augmentin [Amoxicillin-Pot Clavulanate] Diarrhea and Nausea And Vomiting  . Dilaudid [Hydromorphone]   . Penicillins Diarrhea and Nausea And Vomiting    No medications prior to admission.    Review of Systems  Constitutional: Negative.   All other systems reviewed and are negative.   unknown if currently  breastfeeding. Physical Exam  Nursing note and vitals reviewed. Constitutional: She is oriented to person, place, and time. She appears well-developed and well-nourished.  HENT:  Head: Normocephalic and atraumatic.  Cardiovascular: Normal rate and regular rhythm.  Respiratory: Effort normal and breath sounds normal.  GI: Soft. Bowel sounds are normal.  Genitourinary:    Vagina and uterus normal.   Musculoskeletal:        General: Normal range of motion.     Cervical back: Normal range of motion and neck supple.  Neurological: She is alert and oriented to person, place, and time. She has normal reflexes.  Skin: Skin is warm and dry.  Psychiatric: She has a normal mood and affect.    No results found for this or any previous visit (from the past 24 hour(s)).  No results found.  Assessment/Plan: Severe dysmenorrhea refractory to meds(ocps, IUD) daVinci TLH/bilateral salpingectomy Surgical risks discussed.  Kennedy Kennedy J 04/18/2020, 9:14 PM

## 2020-04-19 ENCOUNTER — Encounter (HOSPITAL_BASED_OUTPATIENT_CLINIC_OR_DEPARTMENT_OTHER)
Admission: RE | Disposition: A | Discharge status: Home / Self Care | Payer: Self-pay | Source: Home / Self Care | Attending: Obstetrics and Gynecology

## 2020-04-19 ENCOUNTER — Encounter (HOSPITAL_BASED_OUTPATIENT_CLINIC_OR_DEPARTMENT_OTHER): Payer: Self-pay | Admitting: Obstetrics and Gynecology

## 2020-04-19 ENCOUNTER — Ambulatory Visit (HOSPITAL_BASED_OUTPATIENT_CLINIC_OR_DEPARTMENT_OTHER): Payer: 59 | Admitting: Anesthesiology

## 2020-04-19 ENCOUNTER — Ambulatory Visit (HOSPITAL_BASED_OUTPATIENT_CLINIC_OR_DEPARTMENT_OTHER)
Admission: RE | Admit: 2020-04-19 | Discharge: 2020-04-19 | Disposition: A | Discharge status: Home / Self Care | Payer: 59 | Attending: Obstetrics and Gynecology | Admitting: Obstetrics and Gynecology

## 2020-04-19 ENCOUNTER — Other Ambulatory Visit: Payer: Self-pay

## 2020-04-19 DIAGNOSIS — N815 Vaginal enterocele: Secondary | ICD-10-CM | POA: Diagnosis not present

## 2020-04-19 DIAGNOSIS — J45909 Unspecified asthma, uncomplicated: Secondary | ICD-10-CM | POA: Diagnosis not present

## 2020-04-19 DIAGNOSIS — N736 Female pelvic peritoneal adhesions (postinfective): Secondary | ICD-10-CM | POA: Insufficient documentation

## 2020-04-19 DIAGNOSIS — N946 Dysmenorrhea, unspecified: Secondary | ICD-10-CM | POA: Diagnosis present

## 2020-04-19 DIAGNOSIS — N938 Other specified abnormal uterine and vaginal bleeding: Secondary | ICD-10-CM | POA: Insufficient documentation

## 2020-04-19 DIAGNOSIS — N945 Secondary dysmenorrhea: Secondary | ICD-10-CM | POA: Diagnosis not present

## 2020-04-19 HISTORY — PX: ROBOTIC ASSISTED LAPAROSCOPIC HYSTERECTOMY AND SALPINGECTOMY: SHX6379

## 2020-04-19 HISTORY — DX: Dysmenorrhea, unspecified: N94.6

## 2020-04-19 LAB — POCT PREGNANCY, URINE: Preg Test, Ur: NEGATIVE

## 2020-04-19 LAB — TYPE AND SCREEN
ABO/RH(D): A POS
Antibody Screen: NEGATIVE

## 2020-04-19 SURGERY — XI ROBOTIC ASSISTED LAPAROSCOPIC HYSTERECTOMY AND SALPINGECTOMY
Anesthesia: General | Site: Abdomen | Laterality: Bilateral

## 2020-04-19 MED ORDER — MIDAZOLAM HCL 2 MG/2ML IJ SOLN
INTRAMUSCULAR | Status: DC | PRN
  Administered 2020-04-19: 2 mg via INTRAVENOUS

## 2020-04-19 MED ORDER — PROPOFOL 10 MG/ML IV BOLUS
INTRAVENOUS | Status: DC | PRN
  Administered 2020-04-19: 150 mg via INTRAVENOUS

## 2020-04-19 MED ORDER — TRAMADOL HCL 50 MG PO TABS
ORAL_TABLET | ORAL | Status: AC
  Filled 2020-04-19: qty 2

## 2020-04-19 MED ORDER — LIDOCAINE 2% (20 MG/ML) 5 ML SYRINGE
INTRAMUSCULAR | Status: AC
  Filled 2020-04-19: qty 5

## 2020-04-19 MED ORDER — CEFAZOLIN SODIUM-DEXTROSE 2-4 GM/100ML-% IV SOLN
2.0000 g | INTRAVENOUS | Status: AC
  Administered 2020-04-19: 2 g via INTRAVENOUS

## 2020-04-19 MED ORDER — CELECOXIB 100 MG PO CAPS
ORAL_CAPSULE | ORAL | Status: DC | PRN
Start: 2020-04-19 — End: 2020-04-19
  Administered 2020-04-19: 200 mg via ORAL

## 2020-04-19 MED ORDER — ROCURONIUM BROMIDE 10 MG/ML (PF) SYRINGE
PREFILLED_SYRINGE | INTRAVENOUS | Status: AC
  Filled 2020-04-19: qty 10

## 2020-04-19 MED ORDER — ROCURONIUM BROMIDE 10 MG/ML (PF) SYRINGE
PREFILLED_SYRINGE | INTRAVENOUS | Status: DC | PRN
  Administered 2020-04-19: 70 mg via INTRAVENOUS

## 2020-04-19 MED ORDER — MORPHINE SULFATE (PF) 4 MG/ML IV SOLN
2.0000 mg | INTRAVENOUS | Status: DC
  Administered 2020-04-19: 2 mg via INTRAVENOUS

## 2020-04-19 MED ORDER — OXYCODONE-ACETAMINOPHEN 5-325 MG PO TABS
1.0000 | ORAL_TABLET | ORAL | Status: DC | PRN
  Administered 2020-04-19: 2 via ORAL

## 2020-04-19 MED ORDER — BUPIVACAINE HCL (PF) 0.25 % IJ SOLN
INTRAMUSCULAR | Status: DC | PRN
  Administered 2020-04-19: 20 mL

## 2020-04-19 MED ORDER — LACTATED RINGERS IV SOLN: INTRAVENOUS | Status: DC

## 2020-04-19 MED ORDER — ONDANSETRON HCL 4 MG/2ML IJ SOLN
INTRAMUSCULAR | Status: DC | PRN
  Administered 2020-04-19: 4 mg via INTRAVENOUS

## 2020-04-19 MED ORDER — LIDOCAINE 20MG/ML (2%) 15 ML SYRINGE OPTIME
INTRAMUSCULAR | Status: DC | PRN
  Administered 2020-04-19: 1.5 mg/kg/h via INTRAVENOUS

## 2020-04-19 MED ORDER — FENTANYL CITRATE (PF) 100 MCG/2ML IJ SOLN
25.0000 ug | INTRAMUSCULAR | Status: DC | PRN
  Administered 2020-04-19 (×4): 25 ug via INTRAVENOUS

## 2020-04-19 MED ORDER — MIDAZOLAM HCL 2 MG/2ML IJ SOLN
INTRAMUSCULAR | Status: AC
  Filled 2020-04-19: qty 2

## 2020-04-19 MED ORDER — LIDOCAINE HCL 2 % IJ SOLN
INTRAMUSCULAR | Status: AC
  Filled 2020-04-19: qty 40

## 2020-04-19 MED ORDER — IBUPROFEN 800 MG PO TABS: 800.0000 mg | ORAL_TABLET | Freq: Four times a day (QID) | ORAL | Status: DC

## 2020-04-19 MED ORDER — KETOROLAC TROMETHAMINE 30 MG/ML IJ SOLN
30.0000 mg | Freq: Four times a day (QID) | INTRAMUSCULAR | Status: DC
  Administered 2020-04-19: 30 mg via INTRAVENOUS

## 2020-04-19 MED ORDER — TRAMADOL HCL 50 MG PO TABS: 50.0000 mg | ORAL_TABLET | Freq: Four times a day (QID) | ORAL | 0 refills | Status: DC | PRN

## 2020-04-19 MED ORDER — PROPOFOL 10 MG/ML IV BOLUS
INTRAVENOUS | Status: AC
  Filled 2020-04-19: qty 40

## 2020-04-19 MED ORDER — MORPHINE SULFATE (PF) 4 MG/ML IV SOLN
2.0000 mg | INTRAVENOUS | Status: DC | PRN
  Administered 2020-04-19 (×3): 2 mg via INTRAVENOUS

## 2020-04-19 MED ORDER — OXYCODONE-ACETAMINOPHEN 5-325 MG PO TABS: 1.0000 | ORAL_TABLET | ORAL | 0 refills | Status: DC | PRN

## 2020-04-19 MED ORDER — KETAMINE HCL 10 MG/ML IJ SOLN
INTRAMUSCULAR | Status: DC | PRN
  Administered 2020-04-19: 30 mg via INTRAVENOUS

## 2020-04-19 MED ORDER — LIDOCAINE 2% (20 MG/ML) 5 ML SYRINGE
INTRAMUSCULAR | Status: DC | PRN
  Administered 2020-04-19: 60 mg via INTRAVENOUS

## 2020-04-19 MED ORDER — MORPHINE SULFATE (PF) 4 MG/ML IV SOLN
INTRAVENOUS | Status: AC
  Filled 2020-04-19: qty 1

## 2020-04-19 MED ORDER — DEXAMETHASONE SODIUM PHOSPHATE 10 MG/ML IJ SOLN
INTRAMUSCULAR | Status: AC
  Filled 2020-04-19: qty 1

## 2020-04-19 MED ORDER — ONDANSETRON HCL 4 MG/2ML IJ SOLN
INTRAMUSCULAR | Status: AC
  Filled 2020-04-19: qty 2

## 2020-04-19 MED ORDER — DEXTROSE IN LACTATED RINGERS 5 % IV SOLN: INTRAVENOUS | Status: DC

## 2020-04-19 MED ORDER — SUGAMMADEX SODIUM 200 MG/2ML IV SOLN
INTRAVENOUS | Status: DC | PRN
  Administered 2020-04-19: 175 mg via INTRAVENOUS

## 2020-04-19 MED ORDER — FENTANYL CITRATE (PF) 250 MCG/5ML IJ SOLN
INTRAMUSCULAR | Status: AC
  Filled 2020-04-19: qty 5

## 2020-04-19 MED ORDER — OXYCODONE-ACETAMINOPHEN 5-325 MG PO TABS
ORAL_TABLET | ORAL | Status: AC
  Filled 2020-04-19: qty 2

## 2020-04-19 MED ORDER — ACETAMINOPHEN 325 MG PO TABS
ORAL_TABLET | ORAL | Status: DC | PRN
Start: 2020-04-19 — End: 2020-04-19
  Administered 2020-04-19: 1000 mg via ORAL

## 2020-04-19 MED ORDER — MORPHINE SULFATE (PF) 2 MG/ML IV SOLN
INTRAVENOUS | Status: AC
  Filled 2020-04-19: qty 1

## 2020-04-19 MED ORDER — FENTANYL CITRATE (PF) 100 MCG/2ML IJ SOLN
INTRAMUSCULAR | Status: AC
  Filled 2020-04-19: qty 2

## 2020-04-19 MED ORDER — SODIUM CHLORIDE 0.9 % IV SOLN
INTRAVENOUS | Status: DC | PRN
  Administered 2020-04-19: 1 mL

## 2020-04-19 MED ORDER — CEFAZOLIN SODIUM-DEXTROSE 2-4 GM/100ML-% IV SOLN
INTRAVENOUS | Status: AC
  Filled 2020-04-19: qty 100

## 2020-04-19 MED ORDER — ACETAMINOPHEN 500 MG PO TABS
ORAL_TABLET | ORAL | Status: AC
  Filled 2020-04-19: qty 2

## 2020-04-19 MED ORDER — SCOPOLAMINE 1 MG/3DAYS TD PT72
MEDICATED_PATCH | TRANSDERMAL | Status: AC
  Filled 2020-04-19: qty 1

## 2020-04-19 MED ORDER — TRAMADOL HCL 50 MG PO TABS
50.0000 mg | ORAL_TABLET | Freq: Four times a day (QID) | ORAL | Status: DC | PRN
  Administered 2020-04-19: 100 mg via ORAL

## 2020-04-19 MED ORDER — KETAMINE HCL 10 MG/ML IJ SOLN
INTRAMUSCULAR | Status: AC
  Filled 2020-04-19: qty 1

## 2020-04-19 MED ORDER — CELECOXIB 200 MG PO CAPS
ORAL_CAPSULE | ORAL | Status: AC
  Filled 2020-04-19: qty 1

## 2020-04-19 MED ORDER — FENTANYL CITRATE (PF) 250 MCG/5ML IJ SOLN
INTRAMUSCULAR | Status: DC | PRN
  Administered 2020-04-19: 50 ug via INTRAVENOUS
  Administered 2020-04-19 (×2): 100 ug via INTRAVENOUS

## 2020-04-19 MED ORDER — KETOROLAC TROMETHAMINE 30 MG/ML IJ SOLN: 30.0000 mg | Freq: Once | INTRAMUSCULAR | Status: DC | PRN

## 2020-04-19 MED ORDER — KETOROLAC TROMETHAMINE 30 MG/ML IJ SOLN
INTRAMUSCULAR | Status: AC
  Filled 2020-04-19: qty 1

## 2020-04-19 MED ORDER — DEXAMETHASONE SODIUM PHOSPHATE 10 MG/ML IJ SOLN
INTRAMUSCULAR | Status: DC | PRN
  Administered 2020-04-19: 5 mg via INTRAVENOUS

## 2020-04-19 SURGICAL SUPPLY — 55 items
ADH SKN CLS APL DERMABOND .7 (GAUZE/BANDAGES/DRESSINGS) ×1
APL SRG 38 LTWT LNG FL B (MISCELLANEOUS) ×1
APPLICATOR ARISTA FLEXITIP XL (MISCELLANEOUS) ×1 IMPLANT
COVER BACK TABLE 60X90IN (DRAPES) ×2 IMPLANT
COVER TIP SHEARS 8 DVNC (MISCELLANEOUS) ×1 IMPLANT
COVER TIP SHEARS 8MM DA VINCI (MISCELLANEOUS) ×2
COVER WAND RF STERILE (DRAPES) ×2 IMPLANT
DECANTER SPIKE VIAL GLASS SM (MISCELLANEOUS) ×4 IMPLANT
DEFOGGER SCOPE WARMER CLEARIFY (MISCELLANEOUS) ×2 IMPLANT
DERMABOND ADVANCED (GAUZE/BANDAGES/DRESSINGS) ×1
DERMABOND ADVANCED .7 DNX12 (GAUZE/BANDAGES/DRESSINGS) ×1 IMPLANT
DRAPE ARM DVNC X/XI (DISPOSABLE) ×4 IMPLANT
DRAPE COLUMN DVNC XI (DISPOSABLE) ×1 IMPLANT
DRAPE DA VINCI XI ARM (DISPOSABLE) ×8
DRAPE DA VINCI XI COLUMN (DISPOSABLE) ×2
DURAPREP 26ML APPLICATOR (WOUND CARE) ×2 IMPLANT
ELECT REM PT RETURN 9FT ADLT (ELECTROSURGICAL) ×2
ELECTRODE REM PT RTRN 9FT ADLT (ELECTROSURGICAL) ×1 IMPLANT
GAUZE 4X4 16PLY RFD (DISPOSABLE) ×2 IMPLANT
GLOVE BIOGEL PI IND STRL 7.0 (GLOVE) IMPLANT
GLOVE BIOGEL PI INDICATOR 7.0 (GLOVE) ×2
GLOVE SURG SS PI 6.5 STRL IVOR (GLOVE) ×1 IMPLANT
GLOVE SURG SS PI 7.0 STRL IVOR (GLOVE) ×3 IMPLANT
GLOVE SURG SS PI 7.5 STRL IVOR (GLOVE) ×4 IMPLANT
HEMOSTAT ARISTA ABSORB 3G PWDR (HEMOSTASIS) ×1 IMPLANT
HOLDER FOLEY CATH W/STRAP (MISCELLANEOUS) ×1 IMPLANT
IRRIG SUCT STRYKERFLOW 2 WTIP (MISCELLANEOUS) ×2
IRRIGATION SUCT STRKRFLW 2 WTP (MISCELLANEOUS) ×1 IMPLANT
MANIFOLD NEPTUNE II (INSTRUMENTS) ×1 IMPLANT
NEEDLE INSUFFLATION 150MM (ENDOMECHANICALS) ×2 IMPLANT
OBTURATOR OPTICAL STANDARD 8MM (TROCAR) ×2
OBTURATOR OPTICAL STND 8 DVNC (TROCAR) ×1
OBTURATOR OPTICALSTD 8 DVNC (TROCAR) ×1 IMPLANT
OCCLUDER COLPOPNEUMO (BALLOONS) ×2 IMPLANT
PACK ROBOT WH (CUSTOM PROCEDURE TRAY) ×2 IMPLANT
PACK ROBOTIC GOWN (GOWN DISPOSABLE) ×2 IMPLANT
PACK TRENDGUARD 450 HYBRID PRO (MISCELLANEOUS) IMPLANT
PAD PREP 24X48 CUFFED NSTRL (MISCELLANEOUS) ×2 IMPLANT
PROTECTOR NERVE ULNAR (MISCELLANEOUS) ×4 IMPLANT
SEAL CANN UNIV 5-8 DVNC XI (MISCELLANEOUS) ×3 IMPLANT
SEAL XI 5MM-8MM UNIVERSAL (MISCELLANEOUS) ×6
SET TRI-LUMEN FLTR TB AIRSEAL (TUBING) ×2 IMPLANT
SUT VIC AB 0 CT1 27 (SUTURE) ×2
SUT VIC AB 0 CT1 27XBRD ANBCTR (SUTURE) ×1 IMPLANT
SUT VICRYL 0 UR6 27IN ABS (SUTURE) ×2 IMPLANT
SUT VICRYL RAPIDE 4/0 PS 2 (SUTURE) ×4 IMPLANT
SUT VLOC 180 0 9IN  GS21 (SUTURE) ×2
SUT VLOC 180 0 9IN GS21 (SUTURE) ×1 IMPLANT
TIP UTERINE 6.7X8CM BLUE DISP (MISCELLANEOUS) ×1 IMPLANT
TOWEL OR 17X26 10 PK STRL BLUE (TOWEL DISPOSABLE) ×2 IMPLANT
TRAY FOL W/BAG SLVR 16FR STRL (SET/KITS/TRAYS/PACK) IMPLANT
TRAY FOLEY W/BAG SLVR 16FR LF (SET/KITS/TRAYS/PACK) ×2
TRENDGUARD 450 HYBRID PRO PACK (MISCELLANEOUS) ×2
TROCAR PORT AIRSEAL 8X120 (TROCAR) ×2 IMPLANT
WATER STERILE IRR 1000ML POUR (IV SOLUTION) ×2 IMPLANT

## 2020-04-19 NOTE — Anesthesia Procedure Notes (Signed)
Procedure Name: Intubation Date/Time: 04/19/2020 7:52 AM Performed by: Elyn Peers, CRNA Pre-anesthesia Checklist: Patient identified, Emergency Drugs available, Suction available, Patient being monitored and Timeout performed Patient Re-evaluated:Patient Re-evaluated prior to induction Oxygen Delivery Method: Circle system utilized Preoxygenation: Pre-oxygenation with 100% oxygen Induction Type: IV induction Ventilation: Mask ventilation without difficulty Laryngoscope Size: Miller and 3 Grade View: Grade II Tube type: Oral Tube size: 7.0 mm Number of attempts: 1 Airway Equipment and Method: Stylet Placement Confirmation: ETT inserted through vocal cords under direct vision,  positive ETCO2 and breath sounds checked- equal and bilateral Secured at: 21 cm Tube secured with: Tape Dental Injury: Teeth and Oropharynx as per pre-operative assessment

## 2020-04-19 NOTE — Transfer of Care (Signed)
Immediate Anesthesia Transfer of Care Note  Patient: Kim Kennedy  Procedure(s) Performed: XI ROBOTIC ASSISTED LAPAROSCOPIC HYSTERECTOMY AND SALPINGECTOMY (Bilateral Abdomen)  Patient Location: PACU  Anesthesia Type:General  Level of Consciousness: awake, alert  and patient cooperative  Airway & Oxygen Therapy: Patient Spontanous Breathing and Patient connected to nasal cannula oxygen  Post-op Assessment: Report given to RN and Post -op Vital signs reviewed and stable  Post vital signs: Reviewed and stable  Last Vitals:  Vitals Value Taken Time  BP 136/90 04/19/20 0930  Temp    Pulse 108 04/19/20 0931  Resp 13 04/19/20 0931  SpO2 100 % 04/19/20 0931  Vitals shown include unvalidated device data.  Last Pain:  Vitals:   04/19/20 0608  TempSrc: Oral         Complications: No apparent anesthesia complications

## 2020-04-19 NOTE — Op Note (Signed)
NAME: [place], [place] RS:[id number] ACCOUNT [id number] DATE OF BIRTH:Jun 29, 1981 FACILITY: [facility] LOCATION: [facility] PHYSICIAN:Nyiah Pianka J. 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   PROCEDURE:  Robotic-assisted total laparoscopic hysterectomy, bilateral salpingectomy, lysis of left adnexa to left sigmoid mesentery adhesions, McCall culdoplasty.  SURGEON:  Brien Few, MD  ASSISTANTGarwin Brothers, MD  ESTIMATED BLOOD LOSS:  50 mL.  COMPLICATIONS:  None.  DRAINS:  Foley.  COUNTS:  Correct.  DISPOSITION:  The patient was taken to recovery in good condition.  BRIEF OPERATIVE NOTE:  After being apprised of the risks of anesthesia, infection, bleeding, and surrounding organs, possible need for repair, delayed versus immediate complications including bowel and bladder, internal vessel injury, possible need for  repair, inability to cure all pelvic pain, the patient was brought to the operating room and was administered general anesthetic without complications.  Prepped and draped in usual sterile fashion.  Foley catheter placed.  RUMI retractor placed vaginally  after feet are placed in Yellofin stirrups.  At this time infraumbilical incision was made with a scalpel.  Veress needle placed, opening pressure of -2.  Three liters of CO2 insufflated without difficulty.  Trocar placed atraumatically.  Visualization  reveals an adherent bladder flap due to previous C-section, left adnexal adhesions to the left ovary and left ovarian fossa, normal right adnexa.  Normal tubes, normal appendiceal gallbladder, area was identified in the upper abdomen, as well.   Atraumatic trocar entry is  visualized.  Pictures were taken.  At this time, secondary ports were made, 2 on the left and 1 on the right.  Deep Trendelenburg position was established.  Robot docked in standard fashion and EndoShears and bipolar forceps  were placed.  Left adnexal adhesions lysed sharply along with sigmoid mesentery and an avascular plane exposing an otherwise normal left adnexa.  Normal ureters peristalsing bilaterally.  At this time, both tubes are undermined using EndoShears and  divided at the distal and proximal areas and extracted in total through the laparoscope.  At this time, retroperitoneal space was entered on the left, ureter was identified along the medial leaf of the peritoneum.  The tubo-ovarian ligament was  skeletonized, cauterized, and cut.  The round ligament was skeletonized, cauterized, and cut.  The bladder flap was developed sharply.  The uterine vessels on the left are skeletonized and cauterized, but not cut using bipolar cautery.  On the right the  retroperitoneal space was entered.  The ureter was identified peristalsing on the medial leaf of the peritoneum.  The tubo-ovarian ligament was skeletonized, cauterized, and cut.  The round ligament was cauterized and cut.  The bladder flap was sharply  developed along the area of scarring revealing the bulging of the RUMI cup at the cervicovaginal junction.  The uterine vessels on the right are skeletonized, cauterized, and cut.  On the left, vessels were further skeletonized, cauterized, and cut.  The  cervicovaginal junction was identified.  The bladder flap was clear.  The monopolar cautery was used to divide the specimen at the cervicovaginal junction.  Then the specimen was extracted through the vagina without difficulty.  The vaginal cuff was  hemostatic.  Irrigation was accomplished.  The vaginal cuff was closed in 2 running imbricating layers of 0 V-Loc suture.  McCall culdoplasty suture  placed.  Good hemostasis was noted.  Ureters were seen  peristalsing bilaterally.  Arista is placed.  At  this time, all instruments were removed under direct visualization.  CO2 was released.  Incisions were closed using 0 Vicryl, 4-0 Vicryl, Dermabond.  Marcaine solution placed.  The patient tolerated the procedure well, was awakened and transferred to  recovery in good condition.  CN/NUANCE  D:04/19/2020 T:04/19/2020 JOB:[id number]

## 2020-04-19 NOTE — Anesthesia Preprocedure Evaluation (Addendum)
Anesthesia Evaluation  Patient identified by MRN, date of birth, ID band Patient awake    Reviewed: Allergy & Precautions, NPO status , Patient's Chart, lab work & pertinent test results  Airway Mallampati: II  TM Distance: >3 FB Neck ROM: Full    Dental no notable dental hx.    Pulmonary neg pulmonary ROS,    Pulmonary exam normal breath sounds clear to auscultation       Cardiovascular negative cardio ROS Normal cardiovascular exam Rhythm:Regular Rate:Normal     Neuro/Psych negative neurological ROS  negative psych ROS   GI/Hepatic negative GI ROS, Neg liver ROS,   Endo/Other  negative endocrine ROSPCOS  Renal/GU negative Renal ROS  negative genitourinary   Musculoskeletal   Abdominal   Peds negative pediatric ROS (+)  Hematology negative hematology ROS (+)   Anesthesia Other Findings   Reproductive/Obstetrics negative OB ROS                            Anesthesia Physical Anesthesia Plan  ASA: II  Anesthesia Plan: General   Post-op Pain Management:    Induction: Intravenous  PONV Risk Score and Plan: 4 or greater and Ondansetron, Dexamethasone, Midazolam, Scopolamine patch - Pre-op and Treatment may vary due to age or medical condition  Airway Management Planned: Oral ETT  Additional Equipment:   Intra-op Plan:   Post-operative Plan: Extubation in OR  Informed Consent: I have reviewed the patients History and Physical, chart, labs and discussed the procedure including the risks, benefits and alternatives for the proposed anesthesia with the patient or authorized representative who has indicated his/her understanding and acceptance.     Dental advisory given  Plan Discussed with: CRNA and Surgeon  Anesthesia Plan Comments:         Anesthesia Quick Evaluation

## 2020-04-19 NOTE — Anesthesia Postprocedure Evaluation (Signed)
Anesthesia Post Note  Patient: Kim Kennedy  Procedure(s) Performed: XI ROBOTIC ASSISTED LAPAROSCOPIC HYSTERECTOMY AND SALPINGECTOMY (Bilateral Abdomen)     Patient location during evaluation: PACU Anesthesia Type: General Level of consciousness: awake and alert Pain management: pain level controlled Vital Signs Assessment: post-procedure vital signs reviewed and stable Respiratory status: spontaneous breathing, nonlabored ventilation, respiratory function stable and patient connected to nasal cannula oxygen Cardiovascular status: blood pressure returned to baseline and stable Postop Assessment: no apparent nausea or vomiting Anesthetic complications: no    Last Vitals:  Vitals:   04/19/20 1030 04/19/20 1045  BP:  118/69  Pulse: 98   Resp: 12   Temp:    SpO2: 94%     Last Pain:  Vitals:   04/19/20 1045  TempSrc:   PainSc: 6                  Jerek Meulemans S

## 2020-04-19 NOTE — Op Note (Signed)
04/19/2020  9:16 AM  PATIENT:  Kim Kennedy  39 y.o. female  PRE-OPERATIVE DIAGNOSIS:  Secondary Dysmenorrhea  POST-OPERATIVE DIAGNOSIS:  Secondary Dysmenorrhea Enterocele Left adnexal adhesions  PROCEDURE:  Procedure(s): XI ROBOTIC ASSISTED LAPAROSCOPIC HYSTERECTOMY  BILATERAL SALPINGECTOMY LYSIS OF LEFT ADNEXAL ADHESIONS MCCALL CUL DE PLASTY  SURGEON:  Surgeon(s): Olivia Mackie, MD Maxie Better, MD  ASSISTANTS: Cherly Hensen, MD   ANESTHESIA:   local and general  ESTIMATED BLOOD LOSS: 50 mL   DRAINS: Urinary Catheter (Foley)   LOCAL MEDICATIONS USED:  MARCAINE    and Amount: 20 ml  SPECIMEN:  Source of Specimen:  uterus and cervix  DISPOSITION OF SPECIMEN:  PATHOLOGY  COUNTS:  YES  DICTATION #: [id number]  PLAN OF CARE: dc home today  PATIENT DISPOSITION:  PACU - hemodynamically stable.

## 2020-04-19 NOTE — Discharge Instructions (Signed)
Post Anesthesia Home Care Instructions  Activity: Get plenty of rest for the remainder of the day. A responsible individual must stay with you for 24 hours following the procedure.  For the next 24 hours, DO NOT: -Drive a car -Operate machinery -Drink alcoholic beverages -Take any medication unless instructed by your physician -Make any legal decisions or sign important papers.  Meals: Start with liquid foods such as gelatin or soup. Progress to regular foods as tolerated. Avoid greasy, spicy, heavy foods. If nausea and/or vomiting occur, drink only clear liquids until the nausea and/or vomiting subsides. Call your physician if vomiting continues.  Special Instructions/Symptoms: Your throat may feel dry or sore from the anesthesia or the breathing tube placed in your throat during surgery. If this causes discomfort, gargle with warm salt water. The discomfort should disappear within 24 hours.  If you had a scopolamine patch placed behind your ear for the management of post- operative nausea and/or vomiting:  1. The medication in the patch is effective for 72 hours, after which it should be removed.  Wrap patch in a tissue and discard in the trash. Wash hands thoroughly with soap and water. 2. You may remove the patch earlier than 72 hours if you experience unpleasant side effects which may include dry mouth, dizziness or visual disturbances. 3. Avoid touching the patch. Wash your hands with soap and water after contact with the patch.    Laparoscopically Assisted Vaginal Hysterectomy, Care After This sheet gives you information about how to care for yourself after your procedure. Your health care provider may also give you more specific instructions. If you have problems or questions, contact your health care provider. What can I expect after the procedure? After the procedure, it is common to have:  Soreness and numbness in your incision areas.  Abdominal pain. You will be given pain  medicine to control it.  Vaginal bleeding and discharge. You will need to use a sanitary napkin after this procedure.  Sore throat from the breathing tube that was inserted during surgery. Follow these instructions at home: Medicines  Take over-the-counter and prescription medicines only as told by your health care provider.  Do not take aspirin or ibuprofen. These medicines can cause bleeding.  Do not drive or use heavy machinery while taking prescription pain medicine.  Do not drive for 24 hours if you were given a medicine to help you relax (sedative) during the procedure. Incision care   Follow instructions from your health care provider about how to take care of your incisions. Make sure you: ? Wash your hands with soap and water before you change your bandage (dressing). If soap and water are not available, use hand sanitizer. ? Change your dressing as told by your health care provider. ? Leave stitches (sutures), skin glue, or adhesive strips in place. These skin closures may need to stay in place for 2 weeks or longer. If adhesive strip edges start to loosen and curl up, you may trim the loose edges. Do not remove adhesive strips completely unless your health care provider tells you to do that.  Check your incision area every day for signs of infection. Check for: ? Redness, swelling, or pain. ? Fluid or blood. ? Warmth. ? Pus or a bad smell. Activity  Get regular exercise as told by your health care provider. You may be told to take short walks every day and go farther each time.  Return to your normal activities as told by your health care   provider. Ask your health care provider what activities are safe for you.  Do not douche, use tampons, or have sexual intercourse for at least 6 weeks, or until your health care provider gives you permission.  Do not lift anything that is heavier than 10 lb (4.5 kg), or the limit that your health care provider tells you, until he or  she says that it is safe. General instructions  Do not take baths, swim, or use a hot tub until your health care provider approves. Take showers instead of baths.  Do not drive for 24 hours if you received a sedative.  Do not drive or operate heavy machinery while taking prescription pain medicine.  To prevent or treat constipation while you are taking prescription pain medicine, your health care provider may recommend that you: ? Drink enough fluid to keep your urine clear or pale yellow. ? Take over-the-counter or prescription medicines. ? Eat foods that are high in fiber, such as fresh fruits and vegetables, whole grains, and beans. ? Limit foods that are high in fat and processed sugars, such as fried and sweet foods.  Keep all follow-up visits as told by your health care provider. This is important. Contact a health care provider if:  You have signs of infection, such as: ? Redness, swelling, or pain around your incision sites. ? Fluid or blood coming from an incision. ? An incision that feels warm to the touch. ? Pus or a bad smell coming from an incision.  Your incision breaks open.  Your pain medicine is not helping.  You feel dizzy or light-headed.  You have pain or bleeding when you urinate.  You have persistent nausea and vomiting.  You have blood, pus, or a bad-smelling discharge from your vagina. Get help right away if:  You have a fever.  You have severe abdominal pain.  You have chest pain.  You have shortness of breath.  You faint.  You have pain, swelling, or redness in your leg.  You have heavy bleeding from your vagina. Summary  After the procedure, it is common to have abdominal pain and vaginal bleeding.  You should not drive or lift heavy objects until your health care provider says that it is safe.  Contact your health care provider if you have any symptoms of infection, excessive vaginal bleeding, nausea, vomiting, or shortness of  breath. This information is not intended to replace advice given to you by your health care provider. Make sure you discuss any questions you have with your health care provider. Document Revised: 10/23/2017 Document Reviewed: 01/06/2017 [employer] Patient Education  2020 [employer]  

## 2020-04-19 NOTE — Progress Notes (Signed)
Patient seen and examined. Consent witnessed and signed. No changes noted. Update completed. BP 123/90   Pulse (!) 101   Temp 98.9 F (37.2 C) (Oral)   Resp 16   Ht 5\' 4"  (1.626 m)   Wt 79.6 kg   SpO2 99%   BMI 30.11 kg/m   CBC    Component Value Date/Time   WBC 5.5 04/13/2020 0944   RBC 4.50 04/13/2020 0944   HGB 13.4 04/13/2020 0944   HCT 42.2 04/13/2020 0944   PLT 279 04/13/2020 0944   MCV 93.8 04/13/2020 0944   MCH 29.8 04/13/2020 0944   MCHC 31.8 04/13/2020 0944   RDW 12.9 04/13/2020 0944   LYMPHSABS 2.1 10/04/2019 1537   MONOABS 0.6 10/04/2019 1537   EOSABS 0.1 10/04/2019 1537   BASOSABS 0.1 10/04/2019 1537

## 2020-04-20 LAB — SURGICAL PATHOLOGY

## 2020-06-22 ENCOUNTER — Other Ambulatory Visit: Payer: Self-pay | Admitting: Rheumatology

## 2020-06-22 DIAGNOSIS — R748 Abnormal levels of other serum enzymes: Secondary | ICD-10-CM

## 2020-06-27 ENCOUNTER — Ambulatory Visit
Admission: RE | Admit: 2020-06-27 | Discharge: 2020-06-27 | Disposition: A | Discharge status: Home / Self Care | Payer: 59 | Source: Ambulatory Visit | Attending: Rheumatology | Admitting: Rheumatology

## 2020-06-27 DIAGNOSIS — R748 Abnormal levels of other serum enzymes: Secondary | ICD-10-CM

## 2020-07-02 ENCOUNTER — Other Ambulatory Visit: Payer: Self-pay

## 2020-07-02 ENCOUNTER — Emergency Department (HOSPITAL_COMMUNITY): Payer: 59

## 2020-07-02 ENCOUNTER — Encounter (HOSPITAL_COMMUNITY): Payer: Self-pay

## 2020-07-02 ENCOUNTER — Observation Stay (HOSPITAL_COMMUNITY)
Admission: EM | Admit: 2020-07-02 | Discharge: 2020-07-04 | Disposition: A | Discharge status: Home / Self Care | Payer: 59 | Attending: General Surgery | Admitting: General Surgery

## 2020-07-02 DIAGNOSIS — Z20822 Contact with and (suspected) exposure to covid-19: Secondary | ICD-10-CM | POA: Diagnosis not present

## 2020-07-02 DIAGNOSIS — K91840 Postprocedural hemorrhage and hematoma of a digestive system organ or structure following a digestive system procedure: Secondary | ICD-10-CM | POA: Insufficient documentation

## 2020-07-02 DIAGNOSIS — R1011 Right upper quadrant pain: Secondary | ICD-10-CM | POA: Diagnosis not present

## 2020-07-02 DIAGNOSIS — R39198 Other difficulties with micturition: Secondary | ICD-10-CM | POA: Diagnosis not present

## 2020-07-02 DIAGNOSIS — R509 Fever, unspecified: Secondary | ICD-10-CM | POA: Diagnosis present

## 2020-07-02 DIAGNOSIS — K8020 Calculus of gallbladder without cholecystitis without obstruction: Secondary | ICD-10-CM

## 2020-07-02 DIAGNOSIS — Z91040 Latex allergy status: Secondary | ICD-10-CM | POA: Insufficient documentation

## 2020-07-02 DIAGNOSIS — Z79899 Other long term (current) drug therapy: Secondary | ICD-10-CM | POA: Diagnosis not present

## 2020-07-02 DIAGNOSIS — J45909 Unspecified asthma, uncomplicated: Secondary | ICD-10-CM | POA: Diagnosis not present

## 2020-07-02 HISTORY — DX: Calculus of gallbladder without cholecystitis without obstruction: K80.20

## 2020-07-02 LAB — CBC
HCT: 42.8 % (ref 36.0–46.0)
Hemoglobin: 13.9 g/dL (ref 12.0–15.0)
MCH: 29.5 pg (ref 26.0–34.0)
MCHC: 32.5 g/dL (ref 30.0–36.0)
MCV: 90.9 fL (ref 80.0–100.0)
Platelets: 276 10*3/uL (ref 150–400)
RBC: 4.71 MIL/uL (ref 3.87–5.11)
RDW: 13.1 % (ref 11.5–15.5)
WBC: 6 10*3/uL (ref 4.0–10.5)
nRBC: 0 % (ref 0.0–0.2)

## 2020-07-02 LAB — URINALYSIS, ROUTINE W REFLEX MICROSCOPIC
Bilirubin Urine: NEGATIVE
Glucose, UA: NEGATIVE mg/dL
Hgb urine dipstick: NEGATIVE
Ketones, ur: NEGATIVE mg/dL
Leukocytes,Ua: NEGATIVE
Nitrite: NEGATIVE
Protein, ur: NEGATIVE mg/dL
Specific Gravity, Urine: 1.014 (ref 1.005–1.030)
pH: 7 (ref 5.0–8.0)

## 2020-07-02 LAB — SURGICAL PCR SCREEN
MRSA, PCR: NEGATIVE
Staphylococcus aureus: POSITIVE — AB

## 2020-07-02 LAB — COMPREHENSIVE METABOLIC PANEL
ALT: 30 U/L (ref 0–44)
AST: 21 U/L (ref 15–41)
Albumin: 4.2 g/dL (ref 3.5–5.0)
Alkaline Phosphatase: 51 U/L (ref 38–126)
Anion gap: 9 (ref 5–15)
BUN: 16 mg/dL (ref 6–20)
CO2: 25 mmol/L (ref 22–32)
Calcium: 8.7 mg/dL — ABNORMAL LOW (ref 8.9–10.3)
Chloride: 100 mmol/L (ref 98–111)
Creatinine, Ser: 0.81 mg/dL (ref 0.44–1.00)
GFR calc Af Amer: >60 mL/min (ref >60)
GFR calc non Af Amer: >60 mL/min (ref >60)
Glucose, Bld: 104 mg/dL — ABNORMAL HIGH (ref 70–99)
Potassium: 4 mmol/L (ref 3.5–5.1)
Sodium: 134 mmol/L — ABNORMAL LOW (ref 135–145)
Total Bilirubin: 0.7 mg/dL (ref 0.3–1.2)
Total Protein: 7.5 g/dL (ref 6.5–8.1)

## 2020-07-02 LAB — LIPASE, BLOOD: Lipase: 42 U/L (ref 11–51)

## 2020-07-02 LAB — SARS CORONAVIRUS 2 BY RT PCR (HOSPITAL ORDER, PERFORMED IN ~~LOC~~ HOSPITAL LAB): SARS Coronavirus 2: NEGATIVE

## 2020-07-02 MED ORDER — SODIUM CHLORIDE 0.9% FLUSH: 3.0000 mL | Freq: Once | INTRAVENOUS | Status: DC

## 2020-07-02 MED ORDER — METOPROLOL SUCCINATE ER 50 MG PO TB24
50.0000 mg | ORAL_TABLET | Freq: Every evening | ORAL | Status: DC
  Administered 2020-07-02 – 2020-07-03 (×2): 50 mg via ORAL
  Filled 2020-07-02 (×2): qty 1

## 2020-07-02 MED ORDER — ACETAMINOPHEN 500 MG PO TABS
1000.0000 mg | ORAL_TABLET | ORAL | Status: AC
  Administered 2020-07-03: 1000 mg via ORAL
  Filled 2020-07-02: qty 2

## 2020-07-02 MED ORDER — SIMETHICONE 80 MG PO CHEW: 40.0000 mg | CHEWABLE_TABLET | Freq: Four times a day (QID) | ORAL | Status: DC | PRN

## 2020-07-02 MED ORDER — ONDANSETRON HCL 4 MG/2ML IJ SOLN: 4.0000 mg | Freq: Four times a day (QID) | INTRAMUSCULAR | Status: DC | PRN

## 2020-07-02 MED ORDER — SCOPOLAMINE 1 MG/3DAYS TD PT72
1.0000 | MEDICATED_PATCH | TRANSDERMAL | Status: DC
  Administered 2020-07-03: 1.5 mg via TRANSDERMAL
  Filled 2020-07-02 (×2): qty 1

## 2020-07-02 MED ORDER — ENOXAPARIN SODIUM 40 MG/0.4ML ~~LOC~~ SOLN
40.0000 mg | SUBCUTANEOUS | Status: DC
  Administered 2020-07-02 – 2020-07-03 (×2): 40 mg via SUBCUTANEOUS
  Filled 2020-07-02 (×2): qty 0.4

## 2020-07-02 MED ORDER — MORPHINE SULFATE (PF) 2 MG/ML IV SOLN
2.0000 mg | INTRAVENOUS | Status: DC | PRN
  Administered 2020-07-02 – 2020-07-03 (×5): 2 mg via INTRAVENOUS
  Filled 2020-07-02 (×5): qty 1

## 2020-07-02 MED ORDER — PANTOPRAZOLE SODIUM 40 MG IV SOLR
40.0000 mg | Freq: Every day | INTRAVENOUS | Status: DC
  Administered 2020-07-02 – 2020-07-03 (×2): 40 mg via INTRAVENOUS
  Filled 2020-07-02 (×2): qty 40

## 2020-07-02 MED ORDER — MUPIROCIN 2 % EX OINT
1.0000 "application " | TOPICAL_OINTMENT | Freq: Two times a day (BID) | CUTANEOUS | Status: DC
  Administered 2020-07-02 – 2020-07-04 (×4): 1 via NASAL
  Filled 2020-07-02: qty 22

## 2020-07-02 MED ORDER — ONDANSETRON 4 MG PO TBDP: 4.0000 mg | ORAL_TABLET | Freq: Four times a day (QID) | ORAL | Status: DC | PRN

## 2020-07-02 MED ORDER — GABAPENTIN 300 MG PO CAPS
300.0000 mg | ORAL_CAPSULE | ORAL | Status: AC
  Administered 2020-07-03: 300 mg via ORAL
  Filled 2020-07-02: qty 1

## 2020-07-02 MED ORDER — CIPROFLOXACIN IN D5W 400 MG/200ML IV SOLN
400.0000 mg | INTRAVENOUS | Status: AC
  Administered 2020-07-03: 400 mg via INTRAVENOUS
  Filled 2020-07-02: qty 200

## 2020-07-02 MED ORDER — ACETAMINOPHEN 650 MG RE SUPP: 650.0000 mg | Freq: Four times a day (QID) | RECTAL | Status: DC | PRN

## 2020-07-02 MED ORDER — DIPHENHYDRAMINE HCL 50 MG/ML IJ SOLN
25.0000 mg | Freq: Four times a day (QID) | INTRAMUSCULAR | Status: DC | PRN
  Administered 2020-07-02: 18:00:00 25 mg via INTRAVENOUS
  Filled 2020-07-02: qty 1

## 2020-07-02 MED ORDER — POLYETHYLENE GLYCOL 3350 17 G PO PACK: 17.0000 g | PACK | Freq: Every day | ORAL | Status: DC | PRN

## 2020-07-02 MED ORDER — CYCLOBENZAPRINE HCL 5 MG PO TABS
5.0000 mg | ORAL_TABLET | Freq: Three times a day (TID) | ORAL | Status: DC | PRN
  Administered 2020-07-03: 23:00:00 5 mg via ORAL
  Filled 2020-07-02: qty 1

## 2020-07-02 MED ORDER — ADULT MULTIVITAMIN W/MINERALS CH
1.0000 | ORAL_TABLET | Freq: Every evening | ORAL | Status: DC
  Administered 2020-07-03: 1 via ORAL
  Filled 2020-07-02: qty 1

## 2020-07-02 MED ORDER — ACETAMINOPHEN 325 MG PO TABS
650.0000 mg | ORAL_TABLET | Freq: Four times a day (QID) | ORAL | Status: DC | PRN
  Administered 2020-07-03: 650 mg via ORAL
  Filled 2020-07-02: qty 2

## 2020-07-02 MED ORDER — MORPHINE SULFATE (PF) 4 MG/ML IV SOLN
4.0000 mg | Freq: Once | INTRAVENOUS | Status: AC
  Administered 2020-07-02: 4 mg via INTRAVENOUS
  Filled 2020-07-02: qty 1

## 2020-07-02 MED ORDER — DIPHENHYDRAMINE HCL 25 MG PO CAPS: 25.0000 mg | ORAL_CAPSULE | Freq: Four times a day (QID) | ORAL | Status: DC | PRN

## 2020-07-02 MED ORDER — CHLORHEXIDINE GLUCONATE CLOTH 2 % EX PADS
6.0000 | MEDICATED_PAD | Freq: Every day | CUTANEOUS | Status: DC
  Administered 2020-07-03 – 2020-07-04 (×2): 6 via TOPICAL

## 2020-07-02 MED ORDER — SODIUM CHLORIDE 0.9 % IV SOLN: INTRAVENOUS | Status: DC

## 2020-07-02 MED ORDER — OXYCODONE HCL 5 MG PO TABS
5.0000 mg | ORAL_TABLET | ORAL | Status: DC | PRN
  Administered 2020-07-02: 5 mg via ORAL
  Filled 2020-07-02: qty 2

## 2020-07-02 MED ORDER — METOPROLOL TARTRATE 5 MG/5ML IV SOLN: 5.0000 mg | Freq: Four times a day (QID) | INTRAVENOUS | Status: DC | PRN

## 2020-07-02 MED ORDER — SERTRALINE HCL 50 MG PO TABS
50.0000 mg | ORAL_TABLET | Freq: Every day | ORAL | Status: DC
  Administered 2020-07-03 – 2020-07-04 (×2): 50 mg via ORAL
  Filled 2020-07-02 (×2): qty 1

## 2020-07-02 MED ORDER — CELECOXIB 200 MG PO CAPS
400.0000 mg | ORAL_CAPSULE | ORAL | Status: AC
  Administered 2020-07-03: 400 mg via ORAL
  Filled 2020-07-02: qty 2

## 2020-07-02 MED ORDER — DOCUSATE SODIUM 100 MG PO CAPS
100.0000 mg | ORAL_CAPSULE | Freq: Two times a day (BID) | ORAL | Status: DC
  Administered 2020-07-02 – 2020-07-04 (×4): 100 mg via ORAL
  Filled 2020-07-02 (×4): qty 1

## 2020-07-02 NOTE — H&P (Signed)
Kim RolesSarah E Killings [birth year]  [id number].    Requesting MD: Delon SacramentoMarguax Venter PA-C Chief Complaint: abdominal pain Reason for Consult: possible symptomatic cholelithiasis  HPI: Kim Kennedy is a pt is a 39 y/o with hx of fibromyalgia who [facility]. Patient was seen by her rheumatologist last last week and was told her LFTs were abnormal.  She underwent ultrasound on 06/27/2020 that showed a 2.2 cm gallstone with some thickening of the gallbladder wall at 0.4 cm. CBD wnl at 0.3 cm.  Liver was consistent with possible hepatic steatosis, possible chronic hepatitis or early cirrhosis. Patient reports that yesterday evening around 7pm [place] [facility] she began having epigastric and RUQ abdominal pain with radiation to her back and right scapula. She reports her pain is constant, moderate in severity. It is associated with nausea, bloating and some loose stools. She reports having pizza before onset of symptoms. She is unsure if anything makes her symptoms worse. She tried a heating pad at home without any relief. The morphine in the ED has improved her symptoms. She reports hx of similar pain over the last 6 month that only lasts 1-2 hours before subsiding on it's own.   Work-up in the ED shows she is afebrile and vital signs are stable. Lipase is 42, LFTs are all normal.  WBC 6.0. Repeat ultrasound obtained today shows a 2.6 cm stone present in the gallbladder.  The gallbladder wall is mostly normal with thickness over the gallbladder base measuring 5.4 mm.  No adjacent free fluid findings are not significantly changed from her prior exam.  CBD is 2.6 cm.  Liver shows no focal lesions with normal parenchymal echogenicity.   Patient has a hx of Dx Laparoscopy, C-Section and robotic assisted laparoscopic hysterectomy w/ bilateral salpingectomy. We were asked to see.   ROS: ROS  Review of Systems  Constitutional: Negative for chills and fever.  Respiratory:  Negative for cough and shortness of breath.   Cardiovascular: Negative for chest pain and leg swelling.  Gastrointestinal: Positive for abdominal pain, diarrhea and nausea. Negative for constipation and vomiting.  Genitourinary: Negative for dysuria.  Musculoskeletal: Positive for back pain.  Psychiatric/Behavioral: Negative for substance abuse.  All other systems reviewed and are negative.  Family History  Problem Relation Age of Onset  . Fibromyalgia Mother   . Thrombocytopenia Mother        had spleen removed  . Hepatitis C Mother   . Osteoporosis Mother   . Anxiety disorder Mother   . Hyperlipidemia Father   . Narcolepsy Father   . Heart attack Maternal Grandmother   . Heart attack Maternal Grandfather   . Heart attack Paternal Grandmother   . Osteoporosis Paternal Grandmother   . Heart attack Paternal Grandfather   . Diabetes Paternal Uncle        DM    Past Medical History:  Diagnosis Date  . Anxiety    hx - no meds  . Asthma    prn -seasonal use of inhaler only - rarely use  . Breech presentation 11/22/2014  . Cesarean delivery delivered 12/26/2014  . Chest tightness    anxiety related  . Cramping affecting pregnancy, antepartum   . Depression    hx - no meds   . Dysrhythmia    Hx tachycardia - controlled with lopressor, no problems  . Fibromyalgia 2007   no meds  . GBS (group B streptococcus) UTI complicating pregnancy   . History  of asthma   . 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 IBS (irritable bowel syndrome)    diet controlled  . Inactive TB 2011   chest xray neg  . LGSIL of cervix of undetermined significance   . Nausea    patient denies  . Osteoarthritis   . Palpitations 08/30/2014  . Pneumonia   . Polycystic ovarian syndrome 2011   Borderline A1C  . Postpartum care following cesarean delivery (2/2) 12/26/2014  . Pre-diabetes    diet  . Tachycardia    controlled with lopressor, no problems  . Third trimester pregnancy at less than 36 weeks  11/22/2014    Past Surgical History:  Procedure Laterality Date  . ABDOMINAL HYSTERECTOMY    . CESAREAN SECTION N/A 12/26/2014   Procedure: Primary CESAREAN SECTION;  Surgeon: Lenoard Aden, MD;  Location: [facility];  Service: Obstetrics;  Laterality: N/A;  EDD: 12/23/14  . COLPOSCOPY W/ BIOPSY / CURETTAGE    . DILATION AND EVACUATION N/A 12/26/2014   Procedure: DILATATION AND EVACUATION with Bakri Balloon Placement;  Surgeon: Lenoard Aden, MD;  Location: [facility];  Service: Gynecology;  Laterality: N/A;  . LAPAROSCOPIC LYSIS OF ADHESIONS N/A 02/27/2014   Procedure: LAPAROSCOPIC LYSIS OF ADHESIONS;  Surgeon: Lenoard Aden, MD;  Location: [facility];  Service: Gynecology;  Laterality: N/A;  . LAPAROSCOPY N/A 02/27/2014   Procedure: LAPAROSCOPY DIAGNOSTIC;  Surgeon: Lenoard Aden, MD;  Location: [facility];  Service: Gynecology;  Laterality: N/A;  . MANDIBLE FRACTURE SURGERY  03/2001   x 7 jaw surgeries. TMJ  . NASAL SEPTUM SURGERY  03/2001  . ROBOTIC ASSISTED LAPAROSCOPIC HYSTERECTOMY AND SALPINGECTOMY Bilateral 04/19/2020   Procedure: XI ROBOTIC ASSISTED LAPAROSCOPIC HYSTERECTOMY AND SALPINGECTOMY lysis of left adnexa to left sigmoid mesentery adhesions, Rogelio Seen culdoplasty.;  Surgeon: Olivia Mackie, MD;  Location[facility];  Service: Gynecology;  Laterality: Bilateral;  . tear duct surgery     at 44 months old - right eye  . TONSILLECTOMY AND ADENOIDECTOMY  2000  . WISDOM TOOTH EXTRACTION      Social History:  reports that she has never smoked. She has never used smokeless tobacco. She reports current alcohol use. 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 Last drink > 1 month ago No illicit drug use Lives at home with her husband  Allergies:  Allergies  Allergen Reactions  . Erythromycin Anaphylaxis  . Latex Swelling and Rash    Swelling at contact site  . Zithromax [Azithromycin] Anaphylaxis and  Other (See Comments)    Tightness in chest  . Augmentin [Amoxicillin-Pot Clavulanate] Diarrhea and Nausea And Vomiting  . Dilaudid [Hydromorphone]   . Penicillins Diarrhea and Nausea And Vomiting    (Not in a hospital admission)    Physical Exam: Blood pressure 125/78, pulse 84, temperature 98.6 F (37 C), temperature source Oral, resp. rate 16, height 5\' 4"  (1.626 m), weight 79.4 kg, last menstrual period 02/06/2020, SpO2 96 %, unknown if currently breastfeeding. General: pleasant, WD/WN white female who is laying in bed in NAD HEENT: head is normocephalic, atraumatic.  Sclera are noninjected.  PERRL.  Ears and nose without any masses or lesions.  Mouth is pink and moist. Dentition fair Heart: regular, rate, and rhythm.  Normal s1,s2. No obvious murmurs, gallops, or rubs noted.  Palpable pedal pulses bilaterally  Lungs: CTAB, no wheezes, rhonchi, or rales noted.  Respiratory effort nonlabored Abd: Soft, ND, epigastric and  RUQ tenderness without r/r/g. +BS, no masses, hernias, or organomegaly. Prior surgical scars are well healed MS: no BUE/BLE edema, calves soft and nontender Skin: warm and dry with no masses, lesions, or rashes Psych: A&Ox4 with an appropriate affect Neuro: cranial nerves grossly intact, equal strength in BUE/BLE bilaterally, normal speech, though process intact  Results for orders placed or performed during the hospital encounter of 07/02/20 (from the past 48 hour(s))  Lipase, blood     Status: None   Collection Time: 07/02/20  8:53 AM  Result Value Ref Range   Lipase 42 11 - 51 U/L    Comment: Performed at [facility], [address], [place], [place] [address]  Comprehensive metabolic panel     Status: Abnormal   Collection Time: 07/02/20  8:53 AM  Result Value Ref Range   Sodium 134 (L) 135 - 145 mmol/L   Potassium 4.0 3.5 - 5.1 mmol/L   Chloride 100 98 - 111 mmol/L   CO2 25 22 - 32 mmol/L   Glucose, Bld 104 (H) 70 - 99 mg/dL     Comment: Glucose reference range applies only to samples taken after fasting for at least 8 hours.   BUN 16 6 - 20 mg/dL   Creatinine, Ser 8.67 0.44 - 1.00 mg/dL   Calcium 8.7 (L) 8.9 - 10.3 mg/dL   Total Protein 7.5 6.5 - 8.1 g/dL   Albumin 4.2 3.5 - 5.0 g/dL   AST 21 15 - 41 U/L   ALT 30 0 - 44 U/L   Alkaline Phosphatase 51 38 - 126 U/L   Total Bilirubin 0.7 0.3 - 1.2 mg/dL   GFR calc non Af Amer >60 >60 mL/min   GFR calc Af Amer >60 >60 mL/min   Anion gap 9 5 - 15    Comment: Performed at [facility], [address] [place], [place] [address]  CBC     Status: None   Collection Time: 07/02/20  8:53 AM  Result Value Ref Range   WBC 6.0 4.0 - 10.5 K/uL   RBC 4.71 3.87 - 5.11 MIL/uL   Hemoglobin 13.9 12.0 - 15.0 g/dL   HCT 93.2 36 - 46 %   MCV 90.9 80.0 - 100.0 fL   MCH 29.5 26.0 - 34.0 pg   MCHC 32.5 30.0 - 36.0 g/dL   RDW 67.1 24.5 - 80.9 %   Platelets 276 150 - 400 K/uL   nRBC 0.0 0.0 - 0.2 %    Comment: Performed at [facility], [address], [place], [place] [address]  Urinalysis, Routine w reflex microscopic Urine, Clean Catch     Status: None   Collection Time: 07/02/20  8:53 AM  Result Value Ref Range   Color, Urine YELLOW YELLOW   APPearance CLEAR CLEAR   Specific Gravity, Urine 1.014 1.005 - 1.030   pH 7.0 5.0 - 8.0   Glucose, UA NEGATIVE NEGATIVE mg/dL   Hgb urine dipstick NEGATIVE NEGATIVE   Bilirubin Urine NEGATIVE NEGATIVE   Ketones, ur NEGATIVE NEGATIVE mg/dL   Protein, ur NEGATIVE NEGATIVE mg/dL   Nitrite NEGATIVE NEGATIVE   Leukocytes,Ua NEGATIVE NEGATIVE    Comment: Performed at [facility], [address], [place], [place] [address]   US Abdomen Limited RUQ  Result Date: 07/02/2020 CLINICAL DATA:  Abdominal pain.  Possible gallstones. EXAM: ULTRASOUND ABDOMEN LIMITED RIGHT UPPER QUADRANT COMPARISON:  06/27/2020 FINDINGS: Gallbladder: Evidence of cholelithiasis with large 2.6 cm stone present.  Gallbladder wall is mostly within normal in thickness although it is thickened over the gallbladder base measuring 5.4 mm. Negative sonographic Murphy sign. No adjacent free fluid. Findings are not significantly changed from previous exam. Common bile duct: Diameter: 2.6 cm. Liver: No focal lesion identified. Within normal limits in parenchymal echogenicity. Portal vein is patent on color Doppler imaging with normal direction of blood flow towards the liver. Other: No free fluid. IMPRESSION: Cholelithiasis with 2.6 cm gallstone present. Overall normal gallbladder wall, however wall thickening noted over the gallbladder base measuring 5.4 mm. Findings are not significantly changed from previous exam as recommend clinical correlation for acute cholecystitis. HIDA scan may be helpful as a functional exam. Electronically Signed   By: Elberta Fortis M.D.   On: 07/02/2020 10:50      Assessment/Plan Symptomatic Cholelithiasis - Patient was given the option for trial of outpatient diet medication, pain control w/ close follow up in the office to schedule elective procedure vs admission for possible Lap Chole tomorrow. Will admit to outpatient observation. Will plan for Lap Chole tomorrow with Dr. Maisie Fus if schedule allows.  - I have explained the procedure, risks, and aftercare of cholecystectomy.  Risks include but are not limited to bleeding, infection, wound problems, anesthesia, diarrhea, bile leak, injury to common bile duct/liver/intestine.  Patient and her husband seem to understand and agrees to proceed.  FEN: CLD today, NPO at midnight VTE: SCDs, Lovenox ID: Cipro on call to OR  Jacinto Halim, [facility] 07/02/2020, 2:03 PM Please see Amion for pager number during day hours 7:00am-4:30pm

## 2020-07-02 NOTE — ED Triage Notes (Signed)
Patient c/o intermittent RUQ pain and nausea since yesterday. Patient states she has thickening of the gallbladder wall and a gall stone on a scan last week.

## 2020-07-02 NOTE — ED Provider Notes (Signed)
[facility]-EMERGENCY DEPT Provider Note   CSN: [id number] Arrival date & time: 07/02/20  7510     History Chief Complaint  Patient presents with  . Abdominal Pain    Kim Kennedy is a 39 y.o. female with PMHx Fibromyalgia, PCOS, Anxiety who presents to the ED today with complaint of gradual onset, constant, achy, RUQ abdominal pain that began last night. Pt also complains of associated nausea and intermittent diarrhea.  Patient reports that she saw her rheumatologist last week and had routine lab work done with findings of elevated liver function tests.  An outpatient ultrasound of her gallbladder was ordered with findings:  IMPRESSION: 1. Mild diffuse increase in liver parenchymal echogenicity most commonly seen with hepatic steatosis though differential considerations include chronic hepatitis and early cirrhosis.  2. Large gallstone measuring 2.2 cm with wall thickening in the lower portion of the gallbladder, possibly due to chronic irritation. Consider follow-up ultrasound in 6 months or sooner if patient becomes symptomatic.  Patient reports at that time she did experience pain at the sonographic [facility] sign but did not say anything.  She states she has chronic pain related to her fibromyalgia and did not think much of it.  Her rheumatologist is attempting to get her in to see a hepatologist however she does not know how long that will take.  Patient states last night she began having abdominal pain that radiated in between her shoulder blades.  She drank tea which relieved the shoulder blade pain however she has been having persistent abdominal pain prompting her to come to the ED today.  Surgical history includes hysterectomy in May of this year.   Denies fevers, chills, vomiting, constipation, blood in stool, urinary symptoms, chest pain, shortness of breath, any other associated symptoms.  The history is provided by the patient and medical records.        Past Medical History:  Diagnosis Date  . Anxiety    hx - no meds  . Asthma    prn -seasonal use of inhaler only - rarely use  . Breech presentation 11/22/2014  . Cesarean delivery delivered 12/26/2014  . Chest tightness    anxiety related  . Cramping affecting pregnancy, antepartum   . Depression    hx - no meds   . Dysrhythmia    Hx tachycardia - controlled with lopressor, no problems  . Fibromyalgia 2007   no meds  . GBS (group B streptococcus) UTI complicating pregnancy   . History of asthma   . 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 IBS (irritable bowel syndrome)    diet controlled  . Inactive TB 2011   chest xray neg  . LGSIL of cervix of undetermined significance   . Nausea    patient denies  . Osteoarthritis   . Palpitations 08/30/2014  . Pneumonia   . Polycystic ovarian syndrome 2011   Borderline A1C  . Postpartum care following cesarean delivery (2/2) 12/26/2014  . Pre-diabetes    diet  . Tachycardia    controlled with lopressor, no problems  . Third trimester pregnancy at less than 36 weeks 11/22/2014    Patient Active Problem List   Diagnosis Date Noted  . Symptomatic cholelithiasis 07/02/2020  . Dysmenorrhea 04/19/2020  . Postpartum care following cesarean delivery (2/2) 12/26/2014  . Cesarean delivery delivered 12/26/2014  . Breech presentation 11/22/2014  . Palpitations 08/30/2014    Past Surgical History:  Procedure Laterality Date  . ABDOMINAL HYSTERECTOMY    . CESAREAN  SECTION N/A 12/26/2014   Procedure: Primary CESAREAN SECTION;  Surgeon: Lenoard Aden, MD;  Location: [facility];  Service: Obstetrics;  Laterality: N/A;  EDD: 12/23/14  . COLPOSCOPY W/ BIOPSY / CURETTAGE    . DILATION AND EVACUATION N/A 12/26/2014   Procedure: DILATATION AND EVACUATION with Bakri Balloon Placement;  Surgeon: Lenoard Aden, MD;  Location: [facility];  Service: Gynecology;  Laterality: N/A;  . LAPAROSCOPIC LYSIS OF ADHESIONS N/A 02/27/2014   Procedure: LAPAROSCOPIC  LYSIS OF ADHESIONS;  Surgeon: Lenoard Aden, MD;  Location: [facility];  Service: Gynecology;  Laterality: N/A;  . LAPAROSCOPY N/A 02/27/2014   Procedure: LAPAROSCOPY DIAGNOSTIC;  Surgeon: Lenoard Aden, MD;  Location: [facility];  Service: Gynecology;  Laterality: N/A;  . MANDIBLE FRACTURE SURGERY  03/2001   x 7 jaw surgeries. TMJ  . NASAL SEPTUM SURGERY  03/2001  . ROBOTIC ASSISTED LAPAROSCOPIC HYSTERECTOMY AND SALPINGECTOMY Bilateral 04/19/2020   Procedure: XI ROBOTIC ASSISTED LAPAROSCOPIC HYSTERECTOMY AND SALPINGECTOMY lysis of left adnexa to left sigmoid mesentery adhesions, Rogelio Seen culdoplasty.;  Surgeon: Olivia Mackie, MD;  Location: [facility];  Service: Gynecology;  Laterality: Bilateral;  . tear duct surgery     at 102 months old - right eye  . TONSILLECTOMY AND ADENOIDECTOMY  2000  . WISDOM TOOTH EXTRACTION       OB History    Gravida  1   Para  1   Term  1   Preterm      AB      Living  1     SAB      TAB      Ectopic      Multiple      Live Births  1           Family History  Problem Relation Age of Onset  . Fibromyalgia Mother   . Thrombocytopenia Mother        had spleen removed  . Hepatitis C Mother   . Osteoporosis Mother   . Anxiety disorder Mother   . Hyperlipidemia Father   . Narcolepsy Father   . Heart attack Maternal Grandmother   . Heart attack Maternal Grandfather   . Heart attack Paternal Grandmother   . Osteoporosis Paternal Grandmother   . Heart attack Paternal Grandfather   . Diabetes Paternal Uncle        DM    Social History   Tobacco Use  . Smoking status: Never Smoker  . Smokeless tobacco: Never Used  Vaping Use  . Vaping Use: Never used  Substance Use Topics  . Alcohol use: Yes    Comment: 1 glass of wine/week but none with preganacy  . Drug use: No    Home Medications Prior to Admission medications   Medication Sig Start Date End Date Taking? Authorizing Provider  cyclobenzaprine (FLEXERIL) 10 MG  tablet Take 10 mg by mouth every evening.  09/17/18  Yes [provider]  metoprolol succinate (TOPROL-XL) 50 MG 24 hr tablet Take 1 tablet (50 mg total) by mouth daily. Patient taking differently: Take 50 mg by mouth every evening.  10/19/19  Yes Graciella Freer, PA-C  Multiple Vitamin (MULTIVITAMIN WITH MINERALS) TABS tablet Take 1 tablet by mouth every evening.   Yes [provider]  Probiotic Product (PROBIOTIC DAILY PO) Take 1 tablet by mouth every evening.    Yes [provider]  sertraline (ZOLOFT) 50 MG tablet Take 50 mg by mouth daily. 05/30/20  Yes [provider]  oxyCODONE-acetaminophen (PERCOCET/ROXICET) 5-325 MG tablet Take 1-2 tablets by mouth every 4 (four) hours as needed for moderate pain. Patient not taking: Reported on 07/02/2020 04/19/20   Olivia Mackie, MD  traMADol (ULTRAM) 50 MG tablet Take 1-2 tablets (50-100 mg total) by mouth every 6 (six) hours as needed (mild pain). Patient not taking: Reported on 07/02/2020 04/19/20   Olivia Mackie, MD    Allergies    Erythromycin, Latex, Zithromax [azithromycin], Augmentin [amoxicillin-pot clavulanate], Dilaudid [hydromorphone], and Penicillins  Review of Systems   Review of Systems  Constitutional: Negative for chills and fever.  Respiratory: Negative for shortness of breath.   Cardiovascular: Negative for chest pain.  Gastrointestinal: Positive for abdominal pain, diarrhea and nausea. Negative for blood in stool, constipation and vomiting.  Genitourinary: Negative for difficulty urinating and flank pain.  All other systems reviewed and are negative.   Physical Exam Updated Vital Signs BP 119/76 (BP Location: Left Arm)   Pulse 91   Temp 98.6 F (37 C) (Oral)   Resp 14   Ht 5\' 4"  (1.626 m)   Wt 79.4 kg   LMP 02/06/2020   SpO2 100%   BMI 30.04 kg/m   Physical Exam Vitals and nursing note reviewed.  Constitutional:      Appearance: She is not ill-appearing or diaphoretic.   HENT:     Head: Normocephalic and atraumatic.  Eyes:     Conjunctiva/sclera: Conjunctivae normal.  Cardiovascular:     Rate and Rhythm: Normal rate and regular rhythm.     Heart sounds: Normal heart sounds.  Pulmonary:     Effort: Pulmonary effort is normal.     Breath sounds: Normal breath sounds. No wheezing, rhonchi or rales.  Abdominal:     General: Abdomen is flat.     Palpations: Abdomen is soft.     Tenderness: There is abdominal tenderness in the right upper quadrant. There is no right CVA tenderness, left CVA tenderness, guarding or rebound. Positive signs include Murphy's sign.  Musculoskeletal:     Cervical back: Neck supple.  Skin:    General: Skin is warm and dry.  Neurological:     Mental Status: She is alert.     ED Results / Procedures / Treatments   Labs (all labs ordered are listed, but only abnormal results are displayed) Labs Reviewed  COMPREHENSIVE METABOLIC PANEL - Abnormal; Notable for the following components:      Result Value   Sodium 134 (*)    Glucose, Bld 104 (*)    Calcium 8.7 (*)    All other components within normal limits  SARS CORONAVIRUS 2 BY RT PCR (HOSPITAL ORDER, PERFORMED IN [facility])  LIPASE, BLOOD  CBC  URINALYSIS, ROUTINE W REFLEX MICROSCOPIC  HIV ANTIBODY (ROUTINE TESTING W REFLEX)  CBC  CREATININE, SERUM    EKG None  Radiology 02/08/2020 Abdomen Limited RUQ  Result Date: 07/02/2020 CLINICAL DATA:  Abdominal pain.  Possible gallstones. EXAM: ULTRASOUND ABDOMEN LIMITED RIGHT UPPER QUADRANT COMPARISON:  06/27/2020 FINDINGS: Gallbladder: Evidence of cholelithiasis with large 2.6 cm stone present. Gallbladder wall is mostly within normal in thickness although it is thickened over the gallbladder base measuring 5.4 mm. Negative sonographic Murphy sign. No adjacent free fluid. Findings are not significantly changed from previous exam. Common bile duct: Diameter: 2.6 cm. Liver: No focal lesion identified. Within normal  limits in parenchymal echogenicity. Portal vein is patent on color Doppler imaging with normal direction of blood flow towards the liver. Other: No  free fluid. IMPRESSION: Cholelithiasis with 2.6 cm gallstone present. Overall normal gallbladder wall, however wall thickening noted over the gallbladder base measuring 5.4 mm. Findings are not significantly changed from previous exam as recommend clinical correlation for acute cholecystitis. HIDA scan may be helpful as a functional exam. Electronically Signed   By: Elberta Fortis M.D.   On: 07/02/2020 10:50    Procedures Procedures (including critical care time)  Medications Ordered in ED Medications  metoprolol succinate (TOPROL-XL) 24 hr tablet 50 mg (has no administration in time range)  sertraline (ZOLOFT) tablet 50 mg (has no administration in time range)  multivitamin with minerals tablet 1 tablet (has no administration in time range)  enoxaparin (LOVENOX) injection 40 mg (has no administration in time range)  0.9 %  sodium chloride infusion (has no administration in time range)  ciprofloxacin (CIPRO) IVPB 400 mg (has no administration in time range)  acetaminophen (TYLENOL) tablet 650 mg (has no administration in time range)    Or  acetaminophen (TYLENOL) suppository 650 mg (has no administration in time range)  oxyCODONE (Oxy IR/ROXICODONE) immediate release tablet 5-10 mg (has no administration in time range)  morphine 2 MG/ML injection 2 mg (has no administration in time range)  cyclobenzaprine (FLEXERIL) tablet 5 mg (has no administration in time range)  diphenhydrAMINE (BENADRYL) capsule 25 mg (has no administration in time range)    Or  diphenhydrAMINE (BENADRYL) injection 25 mg (has no administration in time range)  docusate sodium (COLACE) capsule 100 mg (has no administration in time range)  polyethylene glycol (MIRALAX / GLYCOLAX) packet 17 g (has no administration in time range)  ondansetron (ZOFRAN-ODT) disintegrating tablet 4  mg (has no administration in time range)    Or  ondansetron (ZOFRAN) injection 4 mg (has no administration in time range)  simethicone (MYLICON) chewable tablet 40 mg (has no administration in time range)  pantoprazole (PROTONIX) injection 40 mg (has no administration in time range)  metoprolol tartrate (LOPRESSOR) injection 5 mg (has no administration in time range)  morphine 4 MG/ML injection 4 mg (4 mg Intravenous Given 07/02/20 0943)    ED Course  I have reviewed the triage vital signs and the nursing notes.  Pertinent labs & imaging results that were available during my care of the patient were reviewed by me and considered in my medical decision making (see chart for details).    MDM Rules/Calculators/A&P                          39 year old female presents the ED today complaining of right upper quadrant abdominal pain that began last night with associated nausea.  Recently had a right upper quadrant ultrasound done last week after finding incidental elevation in LFTs by rheumatologist.  Findings as above, 2.2 cm gallstone.  She had some pain with sonogram however did not think much of it until she began having pain yesterday.  On arrival to the ED vitals are stable, patient is afebrile, nontachycardic nontachypneic.  She appears to be in no acute distress.  She denies any fevers or chills at home.  Doubt acute cystitis at this time however given right upper quadrant pain with recent finding will repeat right upper quadrant ultrasound at this time and pain lab work.  Will provide pain medication.   CBC without leukocytosis. Hgb stable.  CMP with sodium 134. Glucose 104. No other findings.  Lipase 42.  U/A without infection.   Repeat Ultrasound:  IMPRESSION:  Cholelithiasis  with 2.6 cm gallstone present. Overall normal  gallbladder wall, however wall thickening noted over the gallbladder  base measuring 5.4 mm. Findings are not significantly changed from  previous exam as recommend  clinical correlation for acute  cholecystitis. HIDA scan may be helpful as a functional exam.   Overall labwork reassuring however given mild wall thickening over portion of the gallbladder will consult general surgery to see if they can evaluate patient as there is concern she could go home and worsen.   Discussed case with Will Marlyne BeardsJennings, PA-C, who will have team come evaluate patient. Appreciate their involvement.  Per surgery note pt was given the option of elective removal of gallbladder or diet trial; pt has elected removal of gallbladder and will be admitted by surgical team.   This note was prepared using Dragon voice recognition software and may include unintentional dictation errors due to the inherent limitations of voice recognition software.  Final Clinical Impression(s) / ED Diagnoses Final diagnoses:  RUQ abdominal pain    Rx / DC Orders ED Discharge Orders    None       Tanda RockersVenter, Mariluz Crespo, PA-C 07/02/20 1417    Linwood DibblesKnapp, Jon, MD 07/02/20 [phone]

## 2020-07-03 ENCOUNTER — Observation Stay (HOSPITAL_COMMUNITY): Payer: 59 | Admitting: Certified Registered Nurse Anesthetist

## 2020-07-03 ENCOUNTER — Encounter (HOSPITAL_COMMUNITY)
Admission: EM | Disposition: A | Discharge status: Home / Self Care | Payer: Self-pay | Source: Home / Self Care | Attending: Emergency Medicine

## 2020-07-03 HISTORY — PX: CHOLECYSTECTOMY: SHX55

## 2020-07-03 LAB — COMPREHENSIVE METABOLIC PANEL
ALT: 29 U/L (ref 0–44)
AST: 23 U/L (ref 15–41)
Albumin: 3.8 g/dL (ref 3.5–5.0)
Alkaline Phosphatase: 46 U/L (ref 38–126)
Anion gap: 8 (ref 5–15)
BUN: 12 mg/dL (ref 6–20)
CO2: 25 mmol/L (ref 22–32)
Calcium: 8 mg/dL — ABNORMAL LOW (ref 8.9–10.3)
Chloride: 108 mmol/L (ref 98–111)
Creatinine, Ser: 0.82 mg/dL (ref 0.44–1.00)
GFR calc Af Amer: >60 mL/min (ref >60)
GFR calc non Af Amer: >60 mL/min (ref >60)
Glucose, Bld: 103 mg/dL — ABNORMAL HIGH (ref 70–99)
Potassium: 3.8 mmol/L (ref 3.5–5.1)
Sodium: 141 mmol/L (ref 135–145)
Total Bilirubin: 0.6 mg/dL (ref 0.3–1.2)
Total Protein: 6.1 g/dL — ABNORMAL LOW (ref 6.5–8.1)

## 2020-07-03 LAB — CBC
HCT: 39.2 % (ref 36.0–46.0)
Hemoglobin: 12.8 g/dL (ref 12.0–15.0)
MCH: 30.3 pg (ref 26.0–34.0)
MCHC: 32.7 g/dL (ref 30.0–36.0)
MCV: 92.7 fL (ref 80.0–100.0)
Platelets: 248 10*3/uL (ref 150–400)
RBC: 4.23 MIL/uL (ref 3.87–5.11)
RDW: 13.1 % (ref 11.5–15.5)
WBC: 6.2 10*3/uL (ref 4.0–10.5)
nRBC: 0 % (ref 0.0–0.2)

## 2020-07-03 LAB — HIV ANTIBODY (ROUTINE TESTING W REFLEX): HIV Screen 4th Generation wRfx: NONREACTIVE

## 2020-07-03 SURGERY — LAPAROSCOPIC CHOLECYSTECTOMY WITH INTRAOPERATIVE CHOLANGIOGRAM
Anesthesia: General

## 2020-07-03 MED ORDER — FENTANYL CITRATE (PF) 250 MCG/5ML IJ SOLN
INTRAMUSCULAR | Status: AC
  Filled 2020-07-03: qty 5

## 2020-07-03 MED ORDER — BUPIVACAINE-EPINEPHRINE (PF) 0.25% -1:200000 IJ SOLN
INTRAMUSCULAR | Status: AC
  Filled 2020-07-03: qty 30

## 2020-07-03 MED ORDER — BUPIVACAINE-EPINEPHRINE 0.25% -1:200000 IJ SOLN
INTRAMUSCULAR | Status: DC | PRN
  Administered 2020-07-03: 30 mL

## 2020-07-03 MED ORDER — MIDAZOLAM HCL 5 MG/5ML IJ SOLN
INTRAMUSCULAR | Status: DC | PRN
  Administered 2020-07-03: 2 mg via INTRAVENOUS

## 2020-07-03 MED ORDER — PHENYLEPHRINE 40 MCG/ML (10ML) SYRINGE FOR IV PUSH (FOR BLOOD PRESSURE SUPPORT)
PREFILLED_SYRINGE | INTRAVENOUS | Status: AC
  Filled 2020-07-03: qty 30

## 2020-07-03 MED ORDER — SUGAMMADEX SODIUM 500 MG/5ML IV SOLN
INTRAVENOUS | Status: DC | PRN
Start: 2020-07-03 — End: 2020-07-03
  Administered 2020-07-03: 250 mg via INTRAVENOUS

## 2020-07-03 MED ORDER — LIDOCAINE 2% (20 MG/ML) 5 ML SYRINGE
INTRAMUSCULAR | Status: AC
  Filled 2020-07-03: qty 10

## 2020-07-03 MED ORDER — ROCURONIUM BROMIDE 10 MG/ML (PF) SYRINGE
PREFILLED_SYRINGE | INTRAVENOUS | Status: AC
  Filled 2020-07-03: qty 10

## 2020-07-03 MED ORDER — PROPOFOL 10 MG/ML IV BOLUS
INTRAVENOUS | Status: AC
  Filled 2020-07-03: qty 20

## 2020-07-03 MED ORDER — LIDOCAINE 2% (20 MG/ML) 5 ML SYRINGE
INTRAMUSCULAR | Status: AC
  Filled 2020-07-03: qty 5

## 2020-07-03 MED ORDER — ORAL CARE MOUTH RINSE: 15.0000 mL | Freq: Once | OROMUCOSAL | Status: AC

## 2020-07-03 MED ORDER — LIDOCAINE 2% (20 MG/ML) 5 ML SYRINGE
INTRAMUSCULAR | Status: DC | PRN
  Administered 2020-07-03: 1.5 mg/kg/h via INTRAVENOUS

## 2020-07-03 MED ORDER — MIDAZOLAM HCL 2 MG/2ML IJ SOLN
INTRAMUSCULAR | Status: AC
  Filled 2020-07-03: qty 2

## 2020-07-03 MED ORDER — MORPHINE SULFATE (PF) 2 MG/ML IV SOLN
2.0000 mg | Freq: Once | INTRAVENOUS | Status: AC
  Administered 2020-07-03: 2 mg via INTRAVENOUS
  Filled 2020-07-03: qty 1

## 2020-07-03 MED ORDER — KETOROLAC TROMETHAMINE 30 MG/ML IJ SOLN
INTRAMUSCULAR | Status: AC
  Filled 2020-07-03: qty 1

## 2020-07-03 MED ORDER — CHLORHEXIDINE GLUCONATE 0.12 % MT SOLN
15.0000 mL | Freq: Once | OROMUCOSAL | Status: AC
  Administered 2020-07-03: 15 mL via OROMUCOSAL

## 2020-07-03 MED ORDER — FENTANYL CITRATE (PF) 250 MCG/5ML IJ SOLN
INTRAMUSCULAR | Status: DC | PRN
  Administered 2020-07-03 (×2): 50 ug via INTRAVENOUS
  Administered 2020-07-03: 100 ug via INTRAVENOUS

## 2020-07-03 MED ORDER — EPHEDRINE 5 MG/ML INJ
INTRAVENOUS | Status: AC
  Filled 2020-07-03: qty 10

## 2020-07-03 MED ORDER — LACTATED RINGERS IV SOLN: INTRAVENOUS | Status: DC

## 2020-07-03 MED ORDER — LIDOCAINE 2% (20 MG/ML) 5 ML SYRINGE
INTRAMUSCULAR | Status: DC | PRN
  Administered 2020-07-03: 40 mg via INTRAVENOUS

## 2020-07-03 MED ORDER — PROPOFOL 10 MG/ML IV BOLUS
INTRAVENOUS | Status: DC | PRN
  Administered 2020-07-03: 140 mg via INTRAVENOUS

## 2020-07-03 MED ORDER — ROCURONIUM BROMIDE 10 MG/ML (PF) SYRINGE
PREFILLED_SYRINGE | INTRAVENOUS | Status: DC | PRN
  Administered 2020-07-03: 60 mg via INTRAVENOUS
  Administered 2020-07-03: 10 mg via INTRAVENOUS

## 2020-07-03 MED ORDER — LACTATED RINGERS IR SOLN
Status: DC | PRN
  Administered 2020-07-03: 2000 mL

## 2020-07-03 MED ORDER — ACETAMINOPHEN-CODEINE #3 300-30 MG PO TABS: 1.0000 | ORAL_TABLET | ORAL | Status: DC | PRN

## 2020-07-03 MED ORDER — MORPHINE SULFATE (PF) 2 MG/ML IV SOLN
2.0000 mg | INTRAVENOUS | Status: DC | PRN
  Administered 2020-07-03: 4 mg via INTRAVENOUS
  Administered 2020-07-03 – 2020-07-04 (×2): 2 mg via INTRAVENOUS
  Filled 2020-07-03: qty 1
  Filled 2020-07-03: qty 2
  Filled 2020-07-03: qty 1

## 2020-07-03 MED ORDER — SUGAMMADEX SODIUM 500 MG/5ML IV SOLN
INTRAVENOUS | Status: AC
  Filled 2020-07-03: qty 10

## 2020-07-03 MED ORDER — DEXAMETHASONE SODIUM PHOSPHATE 10 MG/ML IJ SOLN
INTRAMUSCULAR | Status: AC
  Filled 2020-07-03: qty 1

## 2020-07-03 MED ORDER — PROMETHAZINE HCL 25 MG/ML IJ SOLN: 6.2500 mg | INTRAMUSCULAR | Status: DC | PRN

## 2020-07-03 MED ORDER — FENTANYL CITRATE (PF) 100 MCG/2ML IJ SOLN
INTRAMUSCULAR | Status: AC
  Filled 2020-07-03: qty 2

## 2020-07-03 MED ORDER — ONDANSETRON HCL 4 MG/2ML IJ SOLN
INTRAMUSCULAR | Status: AC
  Filled 2020-07-03: qty 2

## 2020-07-03 MED ORDER — PHENYLEPHRINE 40 MCG/ML (10ML) SYRINGE FOR IV PUSH (FOR BLOOD PRESSURE SUPPORT)
PREFILLED_SYRINGE | INTRAVENOUS | Status: DC | PRN
  Administered 2020-07-03 (×2): 80 ug via INTRAVENOUS

## 2020-07-03 MED ORDER — FENTANYL CITRATE (PF) 100 MCG/2ML IJ SOLN
25.0000 ug | INTRAMUSCULAR | Status: DC | PRN
  Administered 2020-07-03: 50 ug via INTRAVENOUS
  Administered 2020-07-03 (×2): 25 ug via INTRAVENOUS
  Administered 2020-07-03: 50 ug via INTRAVENOUS

## 2020-07-03 MED ORDER — KETOROLAC TROMETHAMINE 30 MG/ML IJ SOLN
30.0000 mg | Freq: Once | INTRAMUSCULAR | Status: AC | PRN
  Administered 2020-07-03: 30 mg via INTRAVENOUS

## 2020-07-03 MED ORDER — KETOROLAC TROMETHAMINE 15 MG/ML IJ SOLN
15.0000 mg | Freq: Four times a day (QID) | INTRAMUSCULAR | Status: DC | PRN
  Administered 2020-07-03 – 2020-07-04 (×2): 15 mg via INTRAVENOUS
  Filled 2020-07-03 (×2): qty 1

## 2020-07-03 MED ORDER — DEXAMETHASONE SODIUM PHOSPHATE 10 MG/ML IJ SOLN
INTRAMUSCULAR | Status: DC | PRN
  Administered 2020-07-03: 6 mg via INTRAVENOUS

## 2020-07-03 MED ORDER — ONDANSETRON HCL 4 MG/2ML IJ SOLN
INTRAMUSCULAR | Status: DC | PRN
  Administered 2020-07-03: 4 mg via INTRAVENOUS

## 2020-07-03 MED ORDER — TRAMADOL HCL 50 MG PO TABS
50.0000 mg | ORAL_TABLET | Freq: Four times a day (QID) | ORAL | Status: DC | PRN
  Administered 2020-07-03 – 2020-07-04 (×2): 50 mg via ORAL
  Filled 2020-07-03 (×2): qty 1

## 2020-07-03 SURGICAL SUPPLY — 48 items
ADH SKN CLS APL DERMABOND .7 (GAUZE/BANDAGES/DRESSINGS) ×1
APL PRP STRL LF DISP 70% ISPRP (MISCELLANEOUS) ×1
APPLIER CLIP 5 13 M/L LIGAMAX5 (MISCELLANEOUS) ×2
APR CLP MED LRG 5 ANG JAW (MISCELLANEOUS) ×1
BAG SPEC RTRVL LRG 6X4 10 (ENDOMECHANICALS) ×1
CABLE HIGH FREQUENCY MONO STRZ (ELECTRODE) ×2 IMPLANT
CHLORAPREP W/TINT 26 (MISCELLANEOUS) ×2 IMPLANT
CLIP APPLIE 5 13 M/L LIGAMAX5 (MISCELLANEOUS) ×1 IMPLANT
CNTNR URN SCR LID CUP LEK RST (MISCELLANEOUS) IMPLANT
CONT SPEC 4OZ STRL OR WHT (MISCELLANEOUS) ×2
COVER MAYO STAND STRL (DRAPES) IMPLANT
COVER SURGICAL LIGHT HANDLE (MISCELLANEOUS) ×4 IMPLANT
COVER WAND RF STERILE (DRAPES) IMPLANT
DERMABOND ADVANCED (GAUZE/BANDAGES/DRESSINGS) ×1
DERMABOND ADVANCED .7 DNX12 (GAUZE/BANDAGES/DRESSINGS) ×1 IMPLANT
DEVICE TROCAR PUNCTURE CLOSURE (ENDOMECHANICALS) IMPLANT
DRAPE C-ARM 42X120 X-RAY (DRAPES) IMPLANT
DRAPE LAPAROSCOPIC ABDOMINAL (DRAPES) ×2 IMPLANT
ELECT REM PT RETURN 15FT ADLT (MISCELLANEOUS) ×2 IMPLANT
GLOVE BIO SURGEON STRL SZ 6.5 (GLOVE) ×2 IMPLANT
GLOVE BIOGEL PI IND STRL 7.0 (GLOVE) ×1 IMPLANT
GLOVE BIOGEL PI INDICATOR 7.0 (GLOVE) ×1
GOWN STRL REUS W/TWL XL LVL3 (GOWN DISPOSABLE) ×6 IMPLANT
GRASPER SUT TROCAR 14GX15 (MISCELLANEOUS) ×1 IMPLANT
IRRIG SUCT STRYKERFLOW 2 WTIP (MISCELLANEOUS) ×2
IRRIGATION SUCT STRKRFLW 2 WTP (MISCELLANEOUS) ×1 IMPLANT
IV CATH 14GX2 1/4 (CATHETERS) IMPLANT
KIT BASIN OR (CUSTOM PROCEDURE TRAY) ×2 IMPLANT
KIT TURNOVER KIT A (KITS) ×1 IMPLANT
NDL BIOPSY 14X6 SOFT TISS (NEEDLE) IMPLANT
NEEDLE BIOPSY 14X6 SOFT TISS (NEEDLE) ×2 IMPLANT
PENCIL SMOKE EVACUATOR (MISCELLANEOUS) IMPLANT
POUCH SPECIMEN RETRIEVAL 10MM (ENDOMECHANICALS) ×2 IMPLANT
SCISSORS LAP 5X35 DISP (ENDOMECHANICALS) ×2 IMPLANT
SET CHOLANGIOGRAPH MIX (MISCELLANEOUS) IMPLANT
SET TUBE SMOKE EVAC HIGH FLOW (TUBING) ×2 IMPLANT
SLEEVE XCEL OPT CAN 5 100 (ENDOMECHANICALS) ×4 IMPLANT
SUT VIC AB 2-0 SH 27 (SUTURE) ×2
SUT VIC AB 2-0 SH 27X BRD (SUTURE) IMPLANT
SUT VICRYL 0 27 CT2 27 ABS (SUTURE) ×1 IMPLANT
SUT VICRYL 0 UR6 27IN ABS (SUTURE) IMPLANT
SUT VICRYL 4-0 PS2 18IN ABS (SUTURE) ×2 IMPLANT
TOWEL OR 17X26 10 PK STRL BLUE (TOWEL DISPOSABLE) ×2 IMPLANT
TOWEL OR NON WOVEN STRL DISP B (DISPOSABLE) ×2 IMPLANT
TRAY LAPAROSCOPIC (CUSTOM PROCEDURE TRAY) ×2 IMPLANT
TROCAR BLADELESS OPT 5 100 (ENDOMECHANICALS) ×2 IMPLANT
TROCAR XCEL BLUNT TIP 100MML (ENDOMECHANICALS) ×1 IMPLANT
TROCAR XCEL NON-BLD 11X100MML (ENDOMECHANICALS) IMPLANT

## 2020-07-03 NOTE — Discharge Instructions (Signed)
[facility] ______[facility] °LAPAROSCOPIC SURGERY: POST OP INSTRUCTIONS °Always review your discharge instruction sheet given to you by the facility where your surgery was performed. °IF YOU HAVE DISABILITY OR FAMILY LEAVE FORMS, YOU MUST BRING THEM TO THE OFFICE FOR PROCESSING.   °DO NOT GIVE THEM TO YOUR DOCTOR. ° °1. A prescription for pain medication may be given to you upon discharge.  Take your pain medication as prescribed, if needed.  If narcotic pain medicine is not needed, then you may take acetaminophen (Tylenol) or ibuprofen (Advil) as needed. °2. Take your usually prescribed medications unless otherwise directed. °3. If you need a refill on your pain medication, please contact your pharmacy.  They will contact our office to request authorization. Prescriptions will not be filled after 5pm or on week-ends. °4. You should follow a light diet the first few days after arrival home, such as soup and crackers, etc.  Be sure to include lots of fluids daily. °5. Most patients will experience some swelling and bruising in the area of the incisions.  Ice packs will help.  Swelling and bruising can take several days to resolve.  °6. It is common to experience some constipation if taking pain medication after surgery.  Increasing fluid intake and taking a stool softener (such as Colace) will usually help or prevent this problem from occurring.  A mild laxative (Milk of Magnesia or Miralax) should be taken according to package instructions if there are no bowel movements after 48 hours. °7. Unless discharge instructions indicate otherwise, you may remove your bandages 24-48 hours after surgery, and you may shower at that time.  You may have steri-strips (small skin tapes) in place directly over the incision.  These strips should be left on the skin for 7-10 days.  If your surgeon used skin glue on the incision, you may shower in 24 hours.  The glue will flake off over the next 2-3 weeks.  Any sutures or  staples will be removed at the office during your follow-up visit. °8. ACTIVITIES:  You may resume regular (light) daily activities beginning the next day--such as daily self-care, walking, climbing stairs--gradually increasing activities as tolerated.  You may have sexual intercourse when it is comfortable.  Refrain from any heavy lifting or straining until approved by your doctor. °a. You may drive when you are no longer taking prescription pain medication, you can comfortably wear a seatbelt, and you can safely maneuver your car and apply brakes. °b. RETURN TO WORK:  __________________________________________________________ °9. You should see your doctor in the office for a follow-up appointment approximately 2-3 weeks after your surgery.  Make sure that you call for this appointment within a day or two after you arrive home to insure a convenient appointment time. °10. OTHER INSTRUCTIONS: __________________________________________________________________________________________________________________________ __________________________________________________________________________________________________________________________ °WHEN TO CALL YOUR DOCTOR: °1. Fever over 101.0 °2. Inability to urinate °3. Continued bleeding from incision. °4. Increased pain, redness, or drainage from the incision. °5. Increasing abdominal pain ° °The clinic staff is available to answer your questions during regular business hours.  Please don’t hesitate to call and ask to speak to one of the nurses for clinical concerns.  If you have a medical emergency, go to the nearest emergency room or call 911.  A surgeon from [facility] is always on call at the hospital. °[address], [address], [place], [place]  [address] ? [address] [address], [place], [place]   [address] °[phone] ? [phone] ? FAX [phone] °Web site:   [email or web address] °

## 2020-07-03 NOTE — Transfer of Care (Signed)
Immediate Anesthesia Transfer of Care Note  Patient: Kim Kennedy  Procedure(s) Performed: LAPAROSCOPIC CHOLECYSTECTOMY WITH LIVER BIOPSY (N/A )  Patient Location: PACU  Anesthesia Type:General  Level of Consciousness: awake, drowsy and patient cooperative  Airway & Oxygen Therapy: Patient Spontanous Breathing and Patient connected to face mask oxygen  Post-op Assessment: Report given to RN and Post -op Vital signs reviewed and stable  Post vital signs: Reviewed and stable  Last Vitals:  Vitals Value Taken Time  BP 131/92 07/03/20 1245  Temp 37.4 C 07/03/20 1245  Pulse 104 07/03/20 1246  Resp 9 07/03/20 1246  SpO2 100 % 07/03/20 1246  Vitals shown include unvalidated device data.  Last Pain:  Vitals:   07/03/20 1019  TempSrc: Oral  PainSc:       Patients Stated Pain Goal: 3 (07/03/20 0800)  Complications: No complications documented.

## 2020-07-03 NOTE — Op Note (Signed)
07/03/2020  12:31 PM  PATIENT:  Kim Kennedy  39 y.o. female  Patient Care Team: Soundra Pilon, FNP as PCP - General (Family Medicine)  PRE-OPERATIVE DIAGNOSIS:  symptomatic cholelithiasis  POST-OPERATIVE DIAGNOSIS:  symptomatic cholelithiasis, Abnormal LFTs  PROCEDURE:   LAPAROSCOPIC CHOLECYSTECTOMY WITH LIVER BIOPSY    Surgeon(s): Romie Levee, MD  ASSISTANT: none   ANESTHESIA:   local and general  EBL:  Total I/O In: 200 [IV Piggyback:200] Out: 0   DRAINS: none   SPECIMEN:  Source of Specimen:  gallbladder  DISPOSITION OF SPECIMEN:  PATHOLOGY  COUNTS:  YES  PLAN OF CARE: Discharge to home after PACU  PATIENT DISPOSITION:  PACU - hemodynamically stable.  INDICATION: symptomatic cholelithiasis, concern for NASH  The anatomy & physiology of hepatobiliary & pancreatic function was discussed.  The pathophysiology of gallbladder dysfunction was discussed.  Natural history risks without surgery was discussed.   I feel the risks of no intervention will lead to serious problems that outweigh the operative risks; therefore, I recommended cholecystectomy to remove the pathology.  I explained laparoscopic techniques with possible need for an open approach.  Probable cholangiogram to evaluate the bilary tract was explained as well.    Risks such as bleeding, infection, abscess, leak, injury to other organs, need for further treatment, heart attack, death, and other risks were discussed.  I noted a good likelihood this will help address the problem.  Possibility that this will not correct all abdominal symptoms was explained.  Goals of post-operative recovery were discussed as well.    Patient is currently undergoing a work-up for NASH.  We decided to perform a liver biopsy during surgery to help with this work-up.  OR FINDINGS: cholelithiasis, normal appearing liver  DESCRIPTION:   The patient was identified & brought into the operating room. The patient was positioned  supine with arms tucked. SCDs were active during the entire case. The patient underwent general anesthesia without any difficulty.  The abdomen was prepped and draped in a sterile fashion. A Surgical Timeout was performed and confirmed our plan.  We positioned the patient in reverse [place] & right side up.  I placed a Hassan laparoscopic port through the umbilicus using open entry technique.  Entry was clean. There were no adhesions to the anterior abdominal wall supraumbilically.  We induced carbon dioxide insufflation. Camera inspection revealed no injury.  I proceeded to continue with laparoscopic technique. I placed a 5 mm port in mid subcostal region, another 81mm port in the right flank near the anterior axillary line, and a 65mm port in the left subxiphoid region obliquely within the falciform ligament.  I turned attention to the right upper quadrant.  The gallbladder fundus was elevated cephalad. I used cautery and blunt dissection to free the peritoneal coverings between the gallbladder and the liver on the posteriolateral and anteriomedial walls.   I used careful blunt and cautery dissection with a maryland dissector to help get a good critical view of the cystic artery and cystic duct. I did further dissection to free a few centimeters of the  gallbladder off the liver bed to get a good critical view of the infundibulum and cystic duct. I mobilized the cystic artery.  I skeletonized the cystic duct.  After getting a good 360 view, I decided not to perform a cholangiogram.  I placed a clip on the infundibulum.  I placed clips on the cystic duct x3. I completed cystic duct transection.   I placed clips on the  cystic artery x3 with 2 proximally.  I ligated the cystic artery using scissors. I freed the gallbladder from its remaining attachments to the liver. I ensured hemostasis on the gallbladder fossa of the liver and elsewhere. I inspected the rest of the abdomen & detected no injury nor bleeding  elsewhere.  I irrigated the RUQ with normal saline.  I then placed a biopsy needle into the subcutaneous tissue and inserted into the liver.  We attempted 2 passes with the biopsy needle but were unable to get any liver parenchyma.  I then resected a small portion of the liver parenchyma using laparoscopic scissors.  This was sent to pathology for further evaluation.  The liver biopsy sites were then cauterized using electrocautery.  Hemostasis was good.  I removed the gallbladder through the umbilical port site.  I closed the umbilical fascia using 0 Vicryl stitches.  There was a superior defect noted within the fascia.  I used a PMI and another 0 Vicryl suture to laparoscopically close this defect under direct visualization.  The subcutaneous tissue was reapproximated using interrupted 2-0 Vicryl sutures.   I closed the skin using 4-0 vicryl stitch.  Sterile dressings were applied. The patient was extubated & arrived in the PACU in stable condition.  I had discussed postoperative care with the patient in the holding area.   I will discuss operative findings and postoperative goals / instructions with the patient's family.  Instructions are written in the chart as well.

## 2020-07-03 NOTE — Anesthesia Preprocedure Evaluation (Signed)
Anesthesia Evaluation  Patient identified by MRN, date of birth, ID band Patient awake    Reviewed: Allergy & Precautions, NPO status , Patient's Chart, lab work & pertinent test results  Airway Mallampati: II  TM Distance: >3 FB Neck ROM: Full    Dental no notable dental hx.    Pulmonary asthma ,    Pulmonary exam normal breath sounds clear to auscultation       Cardiovascular negative cardio ROS Normal cardiovascular exam Rhythm:Regular Rate:Normal     Neuro/Psych negative neurological ROS  negative psych ROS   GI/Hepatic negative GI ROS, Neg liver ROS,   Endo/Other  PCOS  Renal/GU negative Renal ROS  negative genitourinary   Musculoskeletal negative musculoskeletal ROS (+)   Abdominal   Peds negative pediatric ROS (+)  Hematology negative hematology ROS (+)   Anesthesia Other Findings   Reproductive/Obstetrics negative OB ROS                             Anesthesia Physical Anesthesia Plan  ASA: II  Anesthesia Plan: General   Post-op Pain Management:    Induction: Intravenous  PONV Risk Score and Plan: 3 and Ondansetron, Dexamethasone, Midazolam and Treatment may vary due to age or medical condition  Airway Management Planned: Oral ETT  Additional Equipment:   Intra-op Plan:   Post-operative Plan: Extubation in OR  Informed Consent: I have reviewed the patients History and Physical, chart, labs and discussed the procedure including the risks, benefits and alternatives for the proposed anesthesia with the patient or authorized representative who has indicated his/her understanding and acceptance.     Dental advisory given  Plan Discussed with: CRNA and Surgeon  Anesthesia Plan Comments:         Anesthesia Quick Evaluation

## 2020-07-03 NOTE — Anesthesia Procedure Notes (Addendum)
Procedure Name: Intubation Date/Time: 07/03/2020 11:20 AM Performed by: West Pugh, CRNA Pre-anesthesia Checklist: Patient identified, Emergency Drugs available, Suction available, Patient being monitored and Timeout performed Patient Re-evaluated:Patient Re-evaluated prior to induction Oxygen Delivery Method: Circle system utilized Preoxygenation: Pre-oxygenation with 100% oxygen Induction Type: IV induction Ventilation: Mask ventilation without difficulty Laryngoscope Size: Mac and 3 Grade View: Grade I Tube type: Oral Tube size: 7.0 mm Number of attempts: 1 Airway Equipment and Method: Stylet Placement Confirmation: ETT inserted through vocal cords under direct vision,  positive ETCO2,  CO2 detector and breath sounds checked- equal and bilateral Secured at: 21 cm Tube secured with: Tape Dental Injury: Teeth and Oropharynx as per pre-operative assessment  Comments: AOI

## 2020-07-03 NOTE — Progress Notes (Signed)
Cholelithiasis  Subjective: No new complaints  Objective: Vital signs in last 24 hours: Temp:  [97.9 F (36.6 C)-98.7 F (37.1 C)] 98 F (36.7 C) (08/10 1019) Pulse Rate:  [78-96] 90 (08/10 1019) Resp:  [16-18] 16 (08/10 1019) BP: (111-124)/(69-89) 120/86 (08/10 1019) SpO2:  [96 %-100 %] 100 % (08/10 1019) Last BM Date: 07/03/20  Intake/Output from previous day: 08/09 0701 - 08/10 0700 In: 2266.8 [P.O.:720; I.V.:1546.8] Out: 350 [Urine:350] Intake/Output this shift: No intake/output data recorded.  General appearance: alert and cooperative GI: soft  Lab Results:  Results for orders placed or performed during the hospital encounter of 07/02/20 (from the past 24 hour(s))  SARS Coronavirus 2 by RT PCR (hospital order, performed in [facility] hospital lab) Nasopharyngeal Nasopharyngeal Swab     Status: None   Collection Time: 07/02/20  2:23 PM   Specimen: Nasopharyngeal Swab  Result Value Ref Range   SARS Coronavirus 2 NEGATIVE NEGATIVE  Surgical pcr screen     Status: Abnormal   Collection Time: 07/02/20  5:06 PM   Specimen: Nasal Mucosa; Nasal Swab  Result Value Ref Range   MRSA, PCR NEGATIVE NEGATIVE   Staphylococcus aureus POSITIVE (A) NEGATIVE  Comprehensive metabolic panel     Status: Abnormal   Collection Time: 07/03/20  4:30 AM  Result Value Ref Range   Sodium 141 135 - 145 mmol/L   Potassium 3.8 3.5 - 5.1 mmol/L   Chloride 108 98 - 111 mmol/L   CO2 25 22 - 32 mmol/L   Glucose, Bld 103 (H) 70 - 99 mg/dL   BUN 12 6 - 20 mg/dL   Creatinine, Ser 5.91 0.44 - 1.00 mg/dL   Calcium 8.0 (L) 8.9 - 10.3 mg/dL   Total Protein 6.1 (L) 6.5 - 8.1 g/dL   Albumin 3.8 3.5 - 5.0 g/dL   AST 23 15 - 41 U/L   ALT 29 0 - 44 U/L   Alkaline Phosphatase 46 38 - 126 U/L   Total Bilirubin 0.6 0.3 - 1.2 mg/dL   GFR calc non Af Amer >60 >60 mL/min   GFR calc Af Amer >60 >60 mL/min   Anion gap 8 5 - 15  CBC     Status: None   Collection Time: 07/03/20  4:30 AM  Result Value Ref  Range   WBC 6.2 4.0 - 10.5 K/uL   RBC 4.23 3.87 - 5.11 MIL/uL   Hemoglobin 12.8 12.0 - 15.0 g/dL   HCT 63.8 36 - 46 %   MCV 92.7 80.0 - 100.0 fL   MCH 30.3 26.0 - 34.0 pg   MCHC 32.7 30.0 - 36.0 g/dL   RDW 46.6 59.9 - 35.7 %   Platelets 248 150 - 400 K/uL   nRBC 0.0 0.0 - 0.2 %     Studies/Results Radiology     MEDS, Scheduled . [MAR Hold] Chlorhexidine Gluconate Cloth  6 each Topical Q0600  . [MAR Hold] docusate sodium  100 mg Oral BID  . [MAR Hold] enoxaparin (LOVENOX) injection  40 mg Subcutaneous Q24H  . [MAR Hold] metoprolol succinate  50 mg Oral QPM  . [MAR Hold] multivitamin with minerals  1 tablet Oral QPM  . [MAR Hold] mupirocin ointment  1 application Nasal BID  . [MAR Hold] pantoprazole (PROTONIX) IV  40 mg Intravenous QHS  . scopolamine  1 patch Transdermal On Call to OR  . [MAR Hold] sertraline  50 mg Oral Daily     Assessment:  Symptomatic cholelithiasis  Plan:  OR today for lap cholecystectomy.  The anatomy & physiology of hepatobiliary & pancreatic function was discussed.  The pathophysiology of gallbladder dysfunction was discussed.  Natural history risks without surgery was discussed.   I feel the risks of no intervention will lead to serious problems that outweigh the operative risks; therefore, I recommended cholecystectomy to remove the pathology.  I explained laparoscopic techniques with possible need for an open approach.  Probable cholangiogram to evaluate the bilary tract was explained as well.    Risks such as bleeding, infection, abscess, leak, injury to other organs, need for further treatment, heart attack, death, and other risks were discussed.  I noted a good likelihood this will help address the problem.  Possibility that this will not correct all abdominal symptoms was explained.  Goals of post-operative recovery were discussed as well.  We will work to minimize complications.  An educational handout further explaining the pathology and  treatment options was given as well.  Questions were answered.  The patient expresses understanding & wishes to proceed with surgery.  LOS: 0 days    Kim Panda, MD [facility], [place]    07/03/2020 10:54 AM

## 2020-07-04 ENCOUNTER — Encounter (HOSPITAL_COMMUNITY): Payer: Self-pay | Admitting: General Surgery

## 2020-07-04 ENCOUNTER — Other Ambulatory Visit: Payer: Self-pay

## 2020-07-04 LAB — SURGICAL PATHOLOGY

## 2020-07-04 MED ORDER — DOCUSATE SODIUM 100 MG PO CAPS: 100.0000 mg | ORAL_CAPSULE | Freq: Two times a day (BID) | ORAL | 0 refills | Status: DC | PRN

## 2020-07-04 MED ORDER — ACETAMINOPHEN 500 MG PO TABS: 1000.0000 mg | ORAL_TABLET | Freq: Four times a day (QID) | ORAL | Status: DC

## 2020-07-04 MED ORDER — OXYCODONE-ACETAMINOPHEN 5-325 MG PO TABS
1.0000 | ORAL_TABLET | Freq: Once | ORAL | Status: AC
  Administered 2020-07-04: 1 via ORAL
  Filled 2020-07-04: qty 1

## 2020-07-04 MED ORDER — TRAMADOL HCL 50 MG PO TABS: 50.0000 mg | ORAL_TABLET | Freq: Four times a day (QID) | ORAL | 0 refills | Status: DC | PRN

## 2020-07-04 MED ORDER — ACETAMINOPHEN 325 MG PO TABS: 650.0000 mg | ORAL_TABLET | Freq: Four times a day (QID) | ORAL | Status: DC | PRN

## 2020-07-04 MED ORDER — ONDANSETRON 4 MG PO TBDP: 4.0000 mg | ORAL_TABLET | Freq: Four times a day (QID) | ORAL | 0 refills | Status: DC | PRN

## 2020-07-04 MED ORDER — OXYCODONE-ACETAMINOPHEN 5-325 MG PO TABS: 1.0000 | ORAL_TABLET | Freq: Four times a day (QID) | ORAL | 0 refills | Status: DC | PRN

## 2020-07-04 MED ORDER — ACETAMINOPHEN 500 MG PO TABS: 1000.0000 mg | ORAL_TABLET | Freq: Once | ORAL | Status: DC

## 2020-07-04 MED ORDER — POLYETHYLENE GLYCOL 3350 17 G PO PACK: 17.0000 g | PACK | Freq: Every day | ORAL | 0 refills | Status: DC | PRN

## 2020-07-04 MED ORDER — OXYCODONE HCL 5 MG PO TABS: 5.0000 mg | ORAL_TABLET | ORAL | Status: DC | PRN

## 2020-07-04 NOTE — Anesthesia Postprocedure Evaluation (Signed)
Anesthesia Post Note  Patient: Kim Kennedy  Procedure(s) Performed: LAPAROSCOPIC CHOLECYSTECTOMY WITH LIVER BIOPSY (N/A )     Patient location during evaluation: PACU Anesthesia Type: General Level of consciousness: awake and alert Pain management: pain level controlled Vital Signs Assessment: post-procedure vital signs reviewed and stable Respiratory status: spontaneous breathing, nonlabored ventilation, respiratory function stable and patient connected to nasal cannula oxygen Cardiovascular status: blood pressure returned to baseline and stable Postop Assessment: no apparent nausea or vomiting Anesthetic complications: no   No complications documented.  Last Vitals:  Vitals:   07/04/20 0210 07/04/20 0604  BP: 97/66 105/69  Pulse: 92 93  Resp: 16 17  Temp: 37.1 C 36.9 C  SpO2: 96% 97%    Last Pain:  Vitals:   07/04/20 0652  TempSrc:   PainSc: 3                  Asherah Lavoy S

## 2020-07-04 NOTE — Discharge Summary (Signed)
Patient ID: Kim Kennedy [id number] Oct 01, 1981 39 y.o.  Admit date: 07/02/2020 Discharge date: 07/04/2020  Admitting Diagnosis: Symptomatic Cholelithiasis  Discharge Diagnosis Patient Active Problem List   Diagnosis Date Noted  . Symptomatic cholelithiasis 07/02/2020  . Dysmenorrhea 04/19/2020  . Postpartum care following cesarean delivery (2/2) 12/26/2014  . Cesarean delivery delivered 12/26/2014  . Breech presentation 11/22/2014  . Palpitations 08/30/2014    Consultants None  H&P: KAMRY FARACI apt is a 39 y/o with hx of fibromyalgia [facility]. Patientwas seen by her rheumatologist lastlast week and was told her LFTs were abnormal. She underwent ultrasound on 06/27/2020 thatshowed a 2.2 cm gallstone with some thickening of the gallbladder wall at 0.4 cm.CBD wnlat 0.3 cm. Liver was consistent with possible hepatic steatosis, possible chronic hepatitis or early cirrhosis. Patient reports that yesterday evening around 7pm when driving home from [facility] she began having epigastric and RUQ abdominal pain with radiation to her back and right scapula. She reports her pain is constant, moderate in severity. It is associated with nausea, bloating and some loose stools. She reports having pizza before onset of symptoms. She is unsure if anything makes her symptoms worse. She tried a heating pad at home without any relief. The morphine in the ED has improved her symptoms. She reports hx of similar pain over the last 6 month that only lasts 1-2 hours before subsiding on it's own.  Work-up in the ED shows she is afebrile and vital signs are stable. Lipase is 42, LFTs are all normal. WBC 6.0.Repeat ultrasound obtained today shows a 2.6 cm stone present in the gallbladder. The gallbladder wall is mostly normal with thickness over the gallbladder base measuring 5.4 mm. No adjacent free fluid findings are not significantly changed from her prior exam.  CBD is 2.6 cm. Liver shows no focal lesions with normal parenchymal echogenicity.   Patient has a hx of Dx Laparoscopy, C-Section androbotic assisted laparoscopic hysterectomy w/bilateralsalpingectomy. We were asked to see.  Procedures Dr. Maisie Fus - Lapraroscopic Cholecystectomy w/ Liver Biopsy - 07/03/2020  Hospital Course:  The patient was admitted and underwent a laparoscopic cholecystectomy w/ liver bx (being worked up for abnormal liver fct tests as outpatient).  The patient tolerated the procedure well.  On POD 1, the patient was tolerating a regular diet, voiding well, mobilizing, and pain was controlled. Patient reports that after her hysterectomy she used Percocet and Ultram for pain which worked well for her. She was given a trial of this inpatient with relief and discharged home on this regimen. The patient was stable for DC home at this time with appropriate follow up made. A note was provided for work.    Physical Exam: Gen:  Alert, NAD, pleasant Card:  RRR, no M/G/R heard Pulm:  CTAB, no W/R/R, effort normal Abd: Soft, ND, appropriately tender around laparoscopic incisions, otherwise NT, +BS, Incisions with glue intact appears well and are without drainage, bleeding, or signs of infection Ext:  No LE edema  Psych: A&Ox3  Skin: no rashes noted, warm and dry  Allergies as of 07/04/2020      Reactions   Erythromycin Anaphylaxis   Latex Swelling, Rash   Swelling at contact site   Zithromax [azithromycin] Anaphylaxis, Other (See Comments)   Tightness in chest   Augmentin [amoxicillin-pot Clavulanate] Diarrhea, Nausea And Vomiting   Dilaudid [hydromorphone]    Penicillins Diarrhea, Nausea And Vomiting      Medication List    TAKE these medications  cyclobenzaprine 10 MG tablet Commonly known as: FLEXERIL Take 10 mg by mouth every evening.   docusate sodium 100 MG capsule Commonly known as: COLACE Take 1 capsule (100 mg total) by mouth 2 (two) times daily as needed  for mild constipation.   metoprolol succinate 50 MG 24 hr tablet Commonly known as: TOPROL-XL Take 1 tablet (50 mg total) by mouth daily. What changed: when to take this   multivitamin with minerals Tabs tablet Take 1 tablet by mouth every evening.   ondansetron 4 MG disintegrating tablet Commonly known as: ZOFRAN-ODT Take 1 tablet (4 mg total) by mouth every 6 (six) hours as needed for nausea.   oxyCODONE-acetaminophen 5-325 MG tablet Commonly known as: PERCOCET/ROXICET Take 1 tablet by mouth every 6 (six) hours as needed for severe pain. What changed:   how much to take  when to take this  reasons to take this   polyethylene glycol 17 g packet Commonly known as: MIRALAX / GLYCOLAX Take 17 g by mouth daily as needed for mild constipation.   PROBIOTIC DAILY PO Take 1 tablet by mouth every evening.   sertraline 50 MG tablet Commonly known as: ZOLOFT Take 50 mg by mouth daily.   traMADol 50 MG tablet Commonly known as: ULTRAM Take 1 tablet (50 mg total) by mouth every 6 (six) hours as needed for severe pain. What changed:   how much to take  reasons to take this         Follow-up Information    [facility] [place] Follow up on 07/19/2020.   Specialty: General Surgery Why: your appointment is at 1:45 PM.  Be at the office 30 minutes early for check-in.  Bring photo ID and insurance information. Contact information: [address] [place] [address] [phone]        Soundra Pilon, FNP Follow up.   Specialty: Family Medicine Why: With your primary care physician that you had surgery.  See them for any additional medical issues. Contact information: [address] [place] [address] [phone]               Signed: Leary Roca, [facility] 07/04/2020, 8:49 AM Please see Amion for pager number during day hours 7:00am-4:30pm

## 2020-07-04 NOTE — Plan of Care (Signed)
  Problem: Education: Goal: Knowledge of General Education information will improve Description: Including pain rating scale, medication(s)/side effects and non-pharmacologic comfort measures Outcome: Progressing   Problem: Clinical Measurements: Goal: Diagnostic test results will improve Outcome: Progressing Goal: Respiratory complications will improve Outcome: Progressing Goal: Cardiovascular complication will be avoided Outcome: Progressing   Problem: Activity: Goal: Risk for activity intolerance will decrease Outcome: Progressing   Problem: Coping: Goal: Level of anxiety will decrease Outcome: Progressing   Problem: Elimination: Goal: Will not experience complications related to bowel motility Outcome: Progressing Goal: Will not experience complications related to urinary retention Outcome: Progressing   Problem: Pain Managment: Goal: General experience of comfort will improve Outcome: Progressing   Problem: Safety: Goal: Ability to remain free from injury will improve Outcome: Progressing   Problem: Education: Goal: Required Educational Video(s) Outcome: Progressing   Problem: Skin Integrity: Goal: Demonstration of wound healing without infection will improve Outcome: Progressing

## 2020-07-04 NOTE — Plan of Care (Signed)
Pt was discharged home today. Instructions were reviewed with patient, and questions were answered. Pt was taken to main entrance via wheelchair by NT.  

## 2020-08-12 ENCOUNTER — Other Ambulatory Visit: Payer: Self-pay | Admitting: Student

## 2020-11-05 ENCOUNTER — Other Ambulatory Visit: Payer: Self-pay | Admitting: Student

## 2020-11-21 ENCOUNTER — Other Ambulatory Visit: Payer: Self-pay | Admitting: Student

## 2020-12-10 ENCOUNTER — Ambulatory Visit: Payer: 59 | Admitting: Student

## 2020-12-14 ENCOUNTER — Other Ambulatory Visit: Payer: Self-pay

## 2020-12-14 ENCOUNTER — Ambulatory Visit: Payer: 59 | Admitting: Student

## 2020-12-14 ENCOUNTER — Ambulatory Visit (INDEPENDENT_AMBULATORY_CARE_PROVIDER_SITE_OTHER): Payer: 59 | Admitting: Student

## 2020-12-14 ENCOUNTER — Encounter: Payer: Self-pay | Admitting: Student

## 2020-12-14 VITALS — BP 100/68 | HR 91 | Ht 64.0 in | Wt 175.0 lb

## 2020-12-14 DIAGNOSIS — R002 Palpitations: Secondary | ICD-10-CM | POA: Diagnosis not present

## 2020-12-14 DIAGNOSIS — E039 Hypothyroidism, unspecified: Secondary | ICD-10-CM

## 2020-12-14 DIAGNOSIS — R000 Tachycardia, unspecified: Secondary | ICD-10-CM

## 2020-12-14 NOTE — Patient Instructions (Signed)
Medication Instructions:  Your physician recommends that you continue on your current medications as directed. Please refer to the Current Medication list given to you today.  *If you need a refill on your cardiac medications before your next appointment, please call your pharmacy*   Lab Work: None ordered   If you have labs (blood work) drawn today and your tests are completely normal, you will receive your results only by: Marland Kitchen MyChart Message (if you have MyChart) OR . A paper copy in the mail If you have any lab test that is abnormal or we need to change your treatment, we will call you to review the results.   Testing/Procedures: None ordered    Follow-Up: At [facility], you and your health needs are our priority.  As part of our continuing mission to provide you with exceptional heart care, we have created designated Provider Care Teams.  These Care Teams include your primary Cardiologist (physician) and Advanced Practice Providers (APPs -  Physician Assistants and Nurse Practitioners) who all work together to provide you with the care you need, when you need it.  We recommend signing up for the patient portal called "MyChart".  Sign up information is provided on this After Visit Summary.  MyChart is used to connect with patients for Virtual Visits (Telemedicine).  Patients are able to view lab/test results, encounter notes, upcoming appointments, etc.  Non-urgent messages can be sent to your provider as well.   To learn more about what you can do with MyChart, go to [email or web address].    Your next appointment:   12 month(s)  The format for your next appointment:   In Person  Provider:   You may see Lewayne Bunting, MD or one of the following Advanced Practice Providers on your designated Care Team:    Gypsy Balsam, NP  Francis Dowse, PA-C  Casimiro Needle "Mardelle Matte" [place], [place]    Other Instructions None

## 2020-12-14 NOTE — Progress Notes (Signed)
PCP:  Soundra Pilon, FNP Primary Cardiologist: No primary care provider on file. Electrophysiologist: Lewayne Bunting, MD   Kim Kennedy is a 40 y.o. female seen today for Lewayne Bunting, MD for routine electrophysiology followup.  Since last being seen in our clinic the patient reports doing great. She has been seeing a Physiatrist and having her hypothyroidism. She is having more energy and overall better quality of life. She has had more breakthrough palpitations in this same setting, but overall well controlled. Her thyroid levels are still "sub-optimal" and she is expecting further dose adjustment next week.  she denies chest pain, dyspnea, PND, orthopnea, nausea, vomiting, dizziness, syncope, edema, weight gain, or early satiety.  Past Medical History:  Diagnosis Date  . Anxiety    hx - no meds  . Asthma    prn -seasonal use of inhaler only - rarely use  . Breech presentation 11/22/2014  . Cesarean delivery delivered 12/26/2014  . Chest tightness    anxiety related  . Cramping affecting pregnancy, antepartum   . Depression    hx - no meds   . Dysrhythmia    Hx tachycardia - controlled with lopressor, no problems  . Fibromyalgia 2007   no meds  . GBS (group B streptococcus) UTI complicating pregnancy   . History of asthma   . 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 IBS (irritable bowel syndrome)    diet controlled  . Inactive TB 2011   chest xray neg  . LGSIL of cervix of undetermined significance   . Nausea    patient denies  . Osteoarthritis   . Palpitations 08/30/2014  . Pneumonia   . Polycystic ovarian syndrome 2011   Borderline A1C  . Postpartum care following cesarean delivery (2/2) 12/26/2014  . Pre-diabetes    diet  . Tachycardia    controlled with lopressor, no problems  . Third trimester pregnancy at less than 36 weeks 11/22/2014   Past Surgical History:  Procedure Laterality Date  . ABDOMINAL HYSTERECTOMY    . CESAREAN SECTION N/A 12/26/2014   Procedure:  Primary CESAREAN SECTION;  Surgeon: Lenoard Aden, MD;  Location: [facility];  Service: Obstetrics;  Laterality: N/A;  EDD: 12/23/14  . CHOLECYSTECTOMY N/A 07/03/2020   Procedure: LAPAROSCOPIC CHOLECYSTECTOMY WITH LIVER BIOPSY;  Surgeon: Romie Levee, MD;  Location: [facility];  Service: General;  Laterality: N/A;  . COLPOSCOPY W/ BIOPSY / CURETTAGE    . DILATION AND EVACUATION N/A 12/26/2014   Procedure: DILATATION AND EVACUATION with Bakri Balloon Placement;  Surgeon: Lenoard Aden, MD;  Location: [facility];  Service: Gynecology;  Laterality: N/A;  . LAPAROSCOPIC LYSIS OF ADHESIONS N/A 02/27/2014   Procedure: LAPAROSCOPIC LYSIS OF ADHESIONS;  Surgeon: Lenoard Aden, MD;  Location: [facility];  Service: Gynecology;  Laterality: N/A;  . LAPAROSCOPY N/A 02/27/2014   Procedure: LAPAROSCOPY DIAGNOSTIC;  Surgeon: Lenoard Aden, MD;  Location: [facility];  Service: Gynecology;  Laterality: N/A;  . MANDIBLE FRACTURE SURGERY  03/2001   x 7 jaw surgeries. TMJ  . NASAL SEPTUM SURGERY  03/2001  . ROBOTIC ASSISTED LAPAROSCOPIC HYSTERECTOMY AND SALPINGECTOMY Bilateral 04/19/2020   Procedure: XI ROBOTIC ASSISTED LAPAROSCOPIC HYSTERECTOMY AND SALPINGECTOMY lysis of left adnexa to left sigmoid mesentery adhesions, Rogelio Seen culdoplasty.;  Surgeon: Olivia Mackie, MD;  Location: [facility];  Service: Gynecology;  Laterality: Bilateral;  . tear duct surgery     at 52 months old - right eye  . TONSILLECTOMY AND ADENOIDECTOMY  2000  .  WISDOM TOOTH EXTRACTION      Current Outpatient Medications  Medication Sig Dispense Refill  . cyclobenzaprine (FLEXERIL) 10 MG tablet Take 10 mg by mouth every evening.     Marland Kitchen liothyronine (CYTOMEL) 5 MCG tablet Take 25 mcg by mouth daily. 2 tab am, 2 tab lunch, 1 tab pm    . metoprolol succinate (TOPROL-XL) 50 MG 24 hr tablet Take 1 tablet (50 mg total) by mouth every evening. Please schedule office visit before any future refills 90 tablet 0  . Multiple Vitamin (MULTIVITAMIN  WITH MINERALS) TABS tablet Take 1 tablet by mouth every evening.    . Omega-3 Fatty Acids (FISH OIL) 1000 MG CAPS Take 3 capsules by mouth daily.    . polyethylene glycol (MIRALAX / GLYCOLAX) 17 g packet Take 17 g by mouth daily as needed for mild constipation. 14 each 0  . Probiotic Product (PROBIOTIC DAILY PO) Take 1 tablet by mouth every evening.     . sertraline (ZOLOFT) 50 MG tablet Take 50 mg by mouth daily.    . traMADol (ULTRAM) 50 MG tablet Take 1 tablet (50 mg total) by mouth every 6 (six) hours as needed for severe pain. 15 tablet 0   No current facility-administered medications for this visit.    Allergies  Allergen Reactions  . Erythromycin Anaphylaxis  . Latex Swelling and Rash    Swelling at contact site  . Zithromax [Azithromycin] Anaphylaxis and Other (See Comments)    Tightness in chest  . Augmentin [Amoxicillin-Pot Clavulanate] Diarrhea and Nausea And Vomiting  . Dilaudid [Hydromorphone]   . Penicillins Diarrhea and Nausea And Vomiting    Social History   Socioeconomic History  . Marital status: Married    Spouse name: Not on file  . Number of children: Not on file  . Years of education: Not on file  . Highest education level: Not on file  Occupational History  . Not on file  Tobacco Use  . Smoking status: Never Smoker  . Smokeless tobacco: Never Used  Vaping Use  . Vaping Use: Never used  Substance and Sexual Activity  . Alcohol use: Yes    Comment: 1 glass of wine/week but none with preganacy  . Drug use: No  . Sexual activity: Yes    Partners: Male    Birth control/protection: None  Other Topics Concern  . Not on file  Social History Narrative  . Not on file   Social Determinants of Health   Financial Resource Strain: Not on file  Food Insecurity: Not on file  Transportation Needs: Not on file  Physical Activity: Not on file  Stress: Not on file  Social Connections: Not on file  Intimate Partner Violence: Not on file     Review of  Systems: General: No chills, fever, night sweats or weight changes  Cardiovascular:  No chest pain, dyspnea on exertion, edema, orthopnea, palpitations, paroxysmal nocturnal dyspnea Dermatological: No rash, lesions or masses Respiratory: No cough, dyspnea Urologic: No hematuria, dysuria Abdominal: No nausea, vomiting, diarrhea, bright red blood per rectum, melena, or hematemesis Neurologic: No visual changes, weakness, changes in mental status All other systems reviewed and are otherwise negative except as noted above.  Physical Exam: Vitals:   12/14/20 1313  BP: 100/68  Pulse: 91  SpO2: 96%  Weight: 175 lb (79.4 kg)  Height: 5\' 4"  (1.626 m)    GEN- The patient is well appearing, alert and oriented x 3 today.   HEENT: normocephalic, atraumatic; sclera clear, conjunctiva  pink; hearing intact; oropharynx clear; neck supple, no JVP Lymph- no cervical lymphadenopathy Lungs- Clear to ausculation bilaterally, normal work of breathing.  No wheezes, rales, rhonchi Heart- Regular rate and rhythm, no murmurs, rubs or gallops, PMI not laterally displaced GI- soft, non-tender, non-distended, bowel sounds present, no hepatosplenomegaly Extremities- no clubbing, cyanosis, or edema; DP/PT/radial pulses 2+ bilaterally MS- no significant deformity or atrophy Skin- warm and dry, no rash or lesion Psych- euthymic mood, full affect Neuro- strength and sensation are intact  EKG is ordered. Personal review of EKG from today shows NSR at 91 bpm, normal intervals  Additional studies reviewed include: Previous EP notes  Assessment and Plan:  1. Palpitations Reasonably well controlled, but some breakthrough with thyroid therapy. She is now even more caffeine sensitive.  Continue toprol 50 mg daily for now. We discussed at length today, we can consider increase without further office visit if she continues to have breakthroughs despite thyroid normalization.   2. Sinus tachycardia Stable. No  indication for further monitoring at this time.  Recommended limiting caffeine intake, and stress.  3. Hypothyroidism She has been started on liothyronine. Follow.  RTC annually. Sooner with symptoms.   Graciella Freer, PA-C  12/14/20 1:19 PM

## 2020-12-19 NOTE — Addendum Note (Signed)
Addended by: Winifred Olive on: 12/19/2020 02:55 PM   Modules accepted: Orders

## 2021-02-10 ENCOUNTER — Other Ambulatory Visit: Payer: Self-pay | Admitting: Student

## 2021-03-28 IMAGING — CR DG CHEST 2V
2 series · 2 of 2 positions shown · non-contrast
Comparison: 10/28/2016

CLINICAL DATA: Centralized chest pressure.

EXAM:
CHEST - 2 VIEW

[w chest pa]
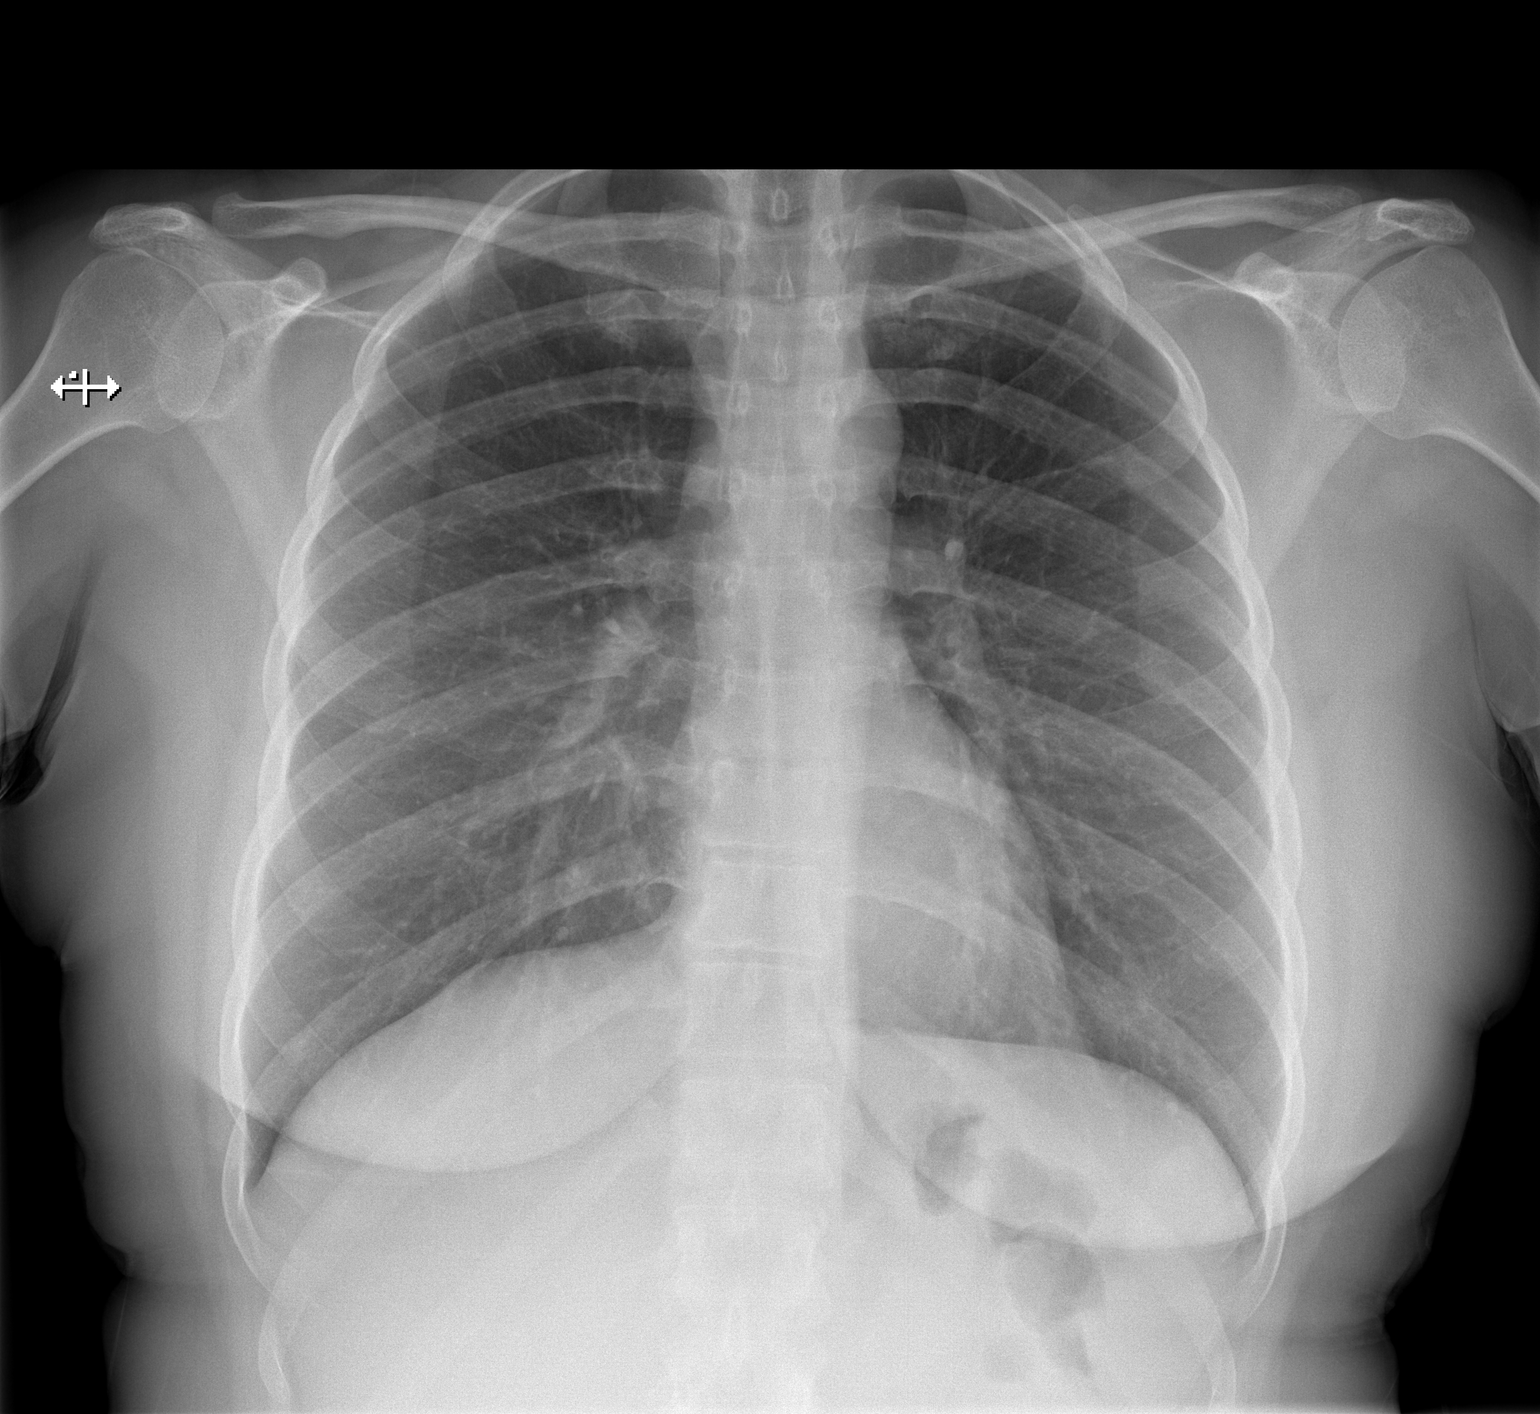

[w chest lat]
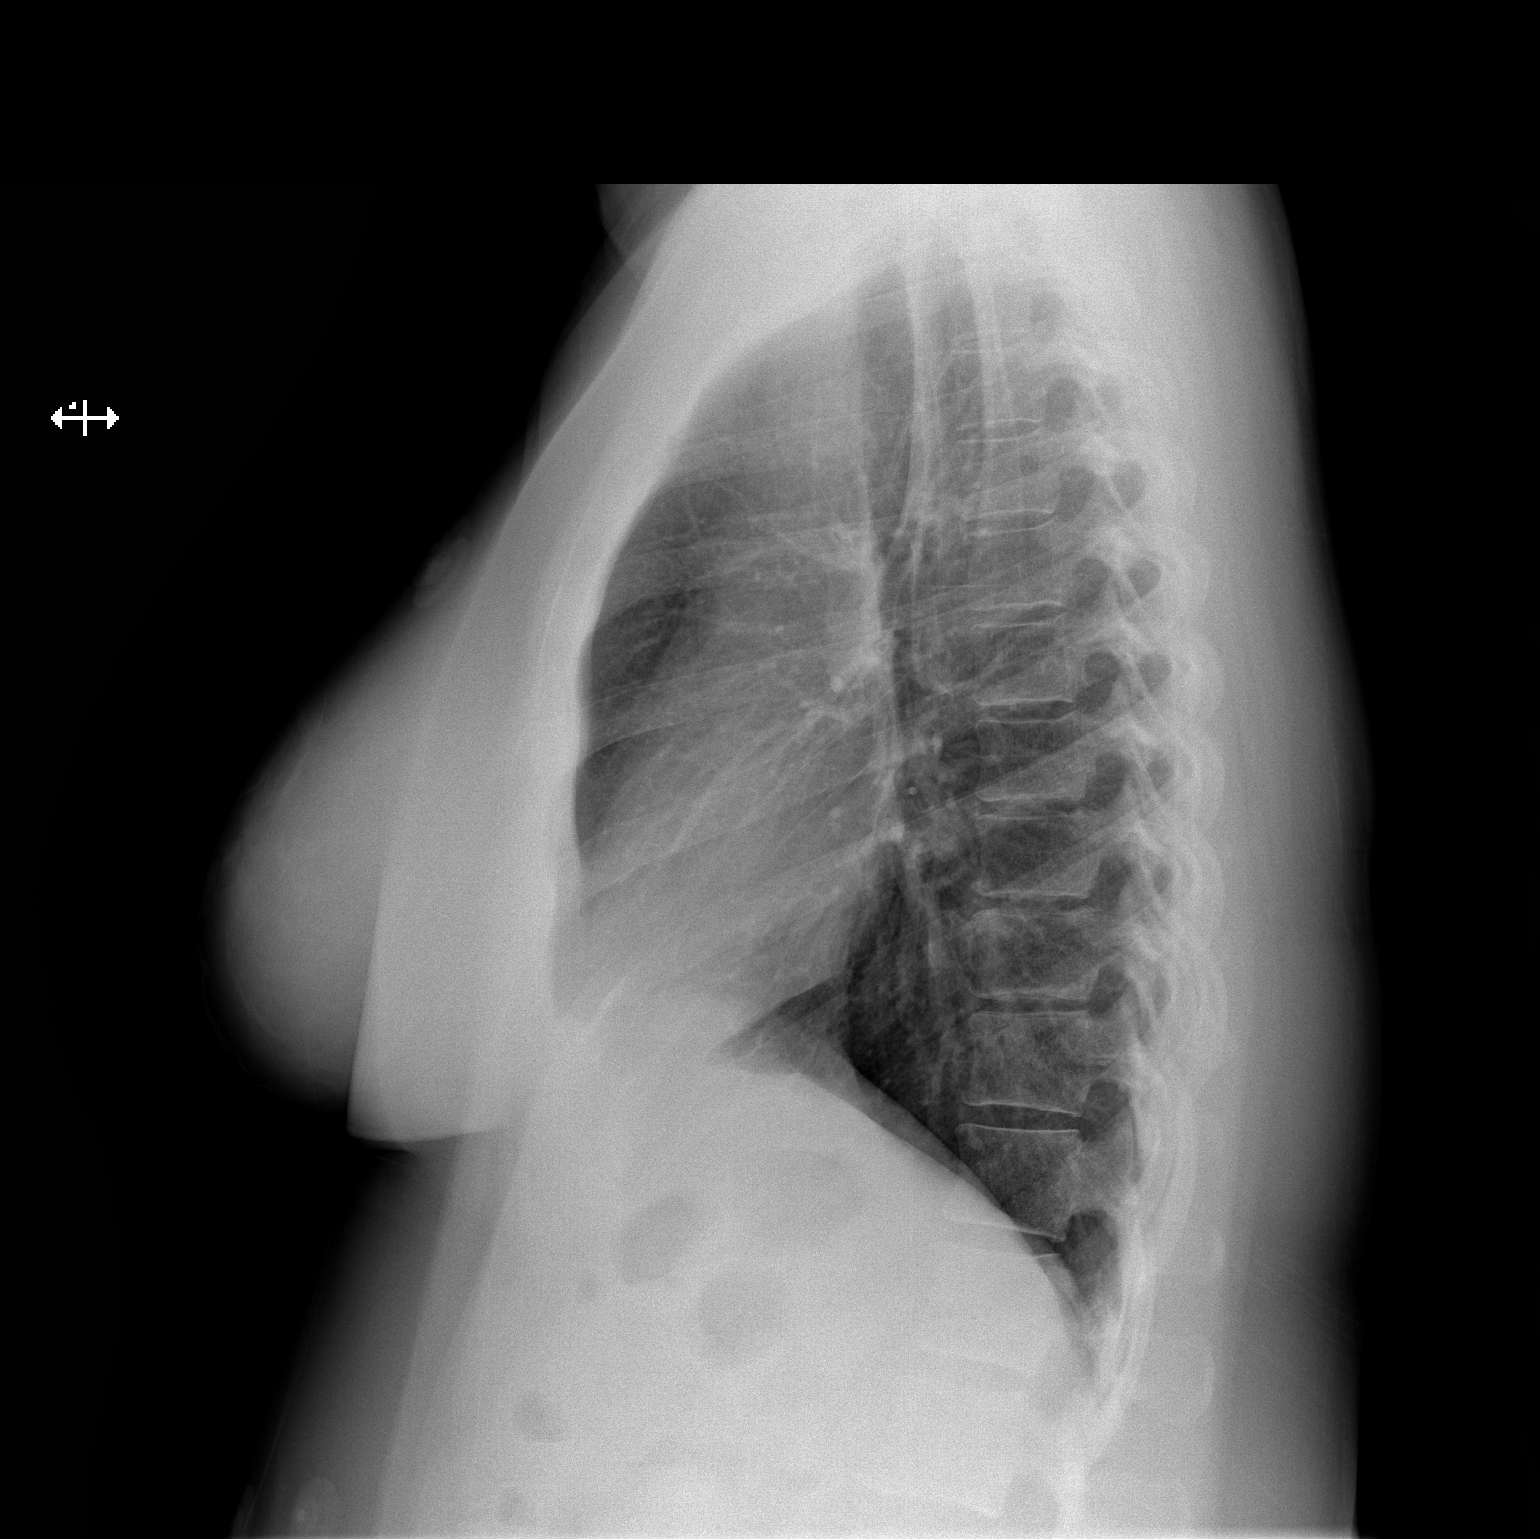

[2 of 2 positions shown; findings below may reference images not displayed]

FINDINGS: The heart size and mediastinal contours are within normal limits.
Both lungs are clear. The visualized skeletal structures are
unremarkable.
IMPRESSION: No active cardiopulmonary disease.

## 2021-04-02 ENCOUNTER — Telehealth: Payer: 59 | Admitting: Family

## 2021-04-02 DIAGNOSIS — R10A0 Flank pain, unspecified side: Secondary | ICD-10-CM

## 2021-04-02 DIAGNOSIS — R399 Unspecified symptoms and signs involving the genitourinary system: Secondary | ICD-10-CM

## 2021-04-02 DIAGNOSIS — R109 Unspecified abdominal pain: Secondary | ICD-10-CM

## 2021-04-03 NOTE — Progress Notes (Signed)
Based on what you shared with me, I feel your condition warrants further evaluation and I recommend that you be seen in a face to face office visit.  Given you are having UTI symptoms and back pain, you need to be seen face to face to rule out a more serious infection.    NOTE: If you entered your credit card information for this eVisit, you will not be charged. You may see a "hold" on your card for the $35 but that hold will drop off and you will not have a charge processed.   If you are having a true medical emergency please call 911.      For an urgent face to face visit, [place] has six urgent care centers for your convenience:     [facility] at [facility] Directions [phone] [address] [place], [place] [address] . 8 am - 4 pm Monday - Friday    [facility]) Get Driving Directions [phone] [address] [place], [place] [address] . 8 am to 8 pm Monday-Friday . 10 am to 6 pm [facility] Urgent [facility]) Get Driving Directions [phone]  [address] [place],  [place]  [address] . 8 am to 8 pm Monday-Friday . 8 am to 4 pm [facility] Urgent Care at [facility] Get Driving Directions [phone]  [address] [place], [place] [address] . 8 am to 8 pm Monday-Friday . 8 am to 4 pm [facility] Urgent Care at [facility] Get Driving Directions  [phone] [address] [place], [place] [address] . 8 am to 8 pm Monday-Friday . 8 am to 4 pm [facility] Urgent Care at [facility] Directions [phone] [address] [place], [place] [address] . 8 am to 8 pm Monday-Friday . 8 am to 4 pm Saturday-Sunday     Your MyChart E-visit questionnaire answers were reviewed by a board certified advanced clinical  practitioner to complete your personal care plan based on your specific symptoms.  Thank you for using e-Visits.

## 2021-12-29 ENCOUNTER — Other Ambulatory Visit: Payer: Self-pay | Admitting: Student

## 2022-03-20 ENCOUNTER — Other Ambulatory Visit: Payer: Self-pay | Admitting: Student

## 2022-05-13 ENCOUNTER — Other Ambulatory Visit: Payer: Self-pay | Admitting: Student

## 2022-06-04 ENCOUNTER — Other Ambulatory Visit: Payer: Self-pay | Admitting: Student

## 2022-06-06 NOTE — Progress Notes (Signed)
PCP:  Soundra Pilon, FNP Primary Cardiologist: None Electrophysiologist: Lewayne Bunting, MD   Kim Kennedy is a 41 y.o. female seen today for Lewayne Bunting, MD for routine electrophysiology followup.  Since last being seen in our clinic the patient reports doing very well. She just got back from an [place] Cruise with her husband and daughter and didn't have any issues.  she denies chest pain, palpitations, dyspnea, PND, orthopnea, nausea, vomiting, dizziness, syncope, edema, weight gain, or early satiety.  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   IBS (irritable bowel syndrome)    diet controlled   Inactive TB 2011   chest xray neg   LGSIL of cervix of undetermined significance    Nausea    patient denies   Osteoarthritis    Palpitations 08/30/2014   Pneumonia    Polycystic ovarian syndrome 2011   Borderline A1C   Postpartum care following cesarean delivery (2/2) 12/26/2014   Pre-diabetes    diet   Tachycardia    controlled with lopressor, no problems   Third trimester pregnancy at less than 36 weeks 11/22/2014   Past Surgical History:  Procedure Laterality Date   ABDOMINAL HYSTERECTOMY     CESAREAN SECTION N/A 12/26/2014   Procedure: Primary CESAREAN SECTION;  Surgeon: Lenoard Aden, MD;  Location: [facility];  Service: Obstetrics;  Laterality: N/A;  EDD: 12/23/14   CHOLECYSTECTOMY N/A 07/03/2020   Procedure: LAPAROSCOPIC CHOLECYSTECTOMY WITH LIVER BIOPSY;  Surgeon: Romie Levee, MD;   Location: [facility];  Service: General;  Laterality: N/A;   COLPOSCOPY W/ BIOPSY / CURETTAGE     DILATION AND EVACUATION N/A 12/26/2014   Procedure: DILATATION AND EVACUATION with Bakri Balloon Placement;  Surgeon: Lenoard Aden, MD;  Location: [facility];  Service: Gynecology;  Laterality: N/A;   LAPAROSCOPIC LYSIS OF ADHESIONS N/A 02/27/2014   Procedure: LAPAROSCOPIC LYSIS OF ADHESIONS;  Surgeon: Lenoard Aden, MD;  Location: [facility];  Service: Gynecology;  Laterality: N/A;   LAPAROSCOPY N/A 02/27/2014   Procedure: LAPAROSCOPY DIAGNOSTIC;  Surgeon: Lenoard Aden, MD;  Location: [facility];  Service: Gynecology;  Laterality: N/A;   MANDIBLE FRACTURE SURGERY  03/2001   x 7 jaw surgeries. TMJ   NASAL SEPTUM SURGERY  03/2001   ROBOTIC ASSISTED LAPAROSCOPIC HYSTERECTOMY AND SALPINGECTOMY Bilateral 04/19/2020   Procedure: XI ROBOTIC ASSISTED LAPAROSCOPIC HYSTERECTOMY AND SALPINGECTOMY lysis of left adnexa to left sigmoid mesentery adhesions, Rogelio Seen culdoplasty.;  Surgeon: Olivia Mackie, MD;  Location: [facility];  Service: Gynecology;  Laterality: Bilateral;   tear duct surgery     at 54 months old - right eye   TONSILLECTOMY AND ADENOIDECTOMY  2000   WISDOM TOOTH EXTRACTION      Current Outpatient Medications  Medication Sig Dispense Refill   cyclobenzaprine (FLEXERIL) 10 MG tablet Take 10 mg by mouth every evening.  liothyronine (CYTOMEL) 5 MCG tablet Take 25 mcg by mouth daily. 2 tab am, 2 tab lunch, 1 tab pm     metoprolol succinate (TOPROL-XL) 50 MG 24 hr tablet TAKE 1 TABLET BY MOUTH IN THE  EVENING. 30 tablet 0   Multiple Vitamin (MULTIVITAMIN WITH MINERALS) TABS tablet Take 1 tablet by mouth every evening.     Omega-3 Fatty Acids (FISH OIL) 1000 MG CAPS Take 3 capsules by mouth daily.     polyethylene glycol (MIRALAX / GLYCOLAX) 17 g packet Take 17 g by mouth daily as needed for mild constipation. 14 each 0   Probiotic Product (PROBIOTIC DAILY PO) Take 1 tablet by mouth every  evening.      sertraline (ZOLOFT) 50 MG tablet Take 50 mg by mouth daily.     traMADol (ULTRAM) 50 MG tablet Take 1 tablet (50 mg total) by mouth every 6 (six) hours as needed for severe pain. 15 tablet 0   No current facility-administered medications for this visit.    Allergies  Allergen Reactions   Erythromycin Anaphylaxis   Latex Swelling and Rash    Swelling at contact site   Zithromax [Azithromycin] Anaphylaxis and Other (See Comments)    Tightness in chest   Augmentin [Amoxicillin-Pot Clavulanate] Diarrhea and Nausea And Vomiting   Dilaudid [Hydromorphone]    Penicillins Diarrhea and Nausea And Vomiting    Social History   Socioeconomic History   Marital status: Married    Spouse name: Not on file   Number of children: Not on file   Years of education: Not on file   Highest education level: Not on file  Occupational History   Not on file  Tobacco Use   Smoking status: Never   Smokeless tobacco: Never  Vaping Use   Vaping Use: Never used  Substance and Sexual Activity   Alcohol use: Yes    Comment: 1 glass of wine/week but none with preganacy   Drug use: No   Sexual activity: Yes    Partners: Male    Birth control/protection: None  Other Topics Concern   Not on file  Social History Narrative   Not on file   Social Determinants of Health   Financial Resource Strain: Not on file  Food Insecurity: Not on file  Transportation Needs: Not on file  Physical Activity: Not on file  Stress: Not on file  Social Connections: Not on file  Intimate Partner Violence: Not on file     Review of Systems: All other systems reviewed and are otherwise negative except as noted above.  Physical Exam: There were no vitals filed for this visit.  GEN- The patient is well appearing, alert and oriented x 3 today.   HEENT: normocephalic, atraumatic; sclera clear, conjunctiva pink; hearing intact; oropharynx clear; neck supple, no JVP Lymph- no cervical  lymphadenopathy Lungs- Clear to ausculation bilaterally, normal work of breathing.  No wheezes, rales, rhonchi Heart- Regular rate and rhythm, no murmurs, rubs or gallops, PMI not laterally displaced GI- soft, non-tender, non-distended, bowel sounds present, no hepatosplenomegaly Extremities- no clubbing, cyanosis, or edema; DP/PT/radial pulses 2+ bilaterally MS- no significant deformity or atrophy Skin- warm and dry, no rash or lesion Psych- euthymic mood, full affect Neuro- strength and sensation are intact  EKG is ordered. Personal review of EKG from today shows NSR at 86 bpm  Additional studies reviewed include: Previous EP office notes.   Assessment and Plan:  1. Palpitations Reasonably well controlled  She is now even more  caffeine sensitive.  Continue toprol 50 mg daily. Can titrate up if needed.    2. Sinus tachycardia Stable.  Limit caffine, ETOH, stress as able.   3. Hypothyroidism She has been started on liothyronine. Follow.  Follow up with Dr. Ladona Ridgel in 12 months   Graciella Freer, [place]  06/06/22 8:35 AM

## 2022-06-13 ENCOUNTER — Encounter: Payer: Self-pay | Admitting: Student

## 2022-06-13 ENCOUNTER — Ambulatory Visit (INDEPENDENT_AMBULATORY_CARE_PROVIDER_SITE_OTHER): Payer: 59 | Admitting: Student

## 2022-06-13 VITALS — BP 110/80 | HR 86 | Ht 64.0 in | Wt 187.4 lb

## 2022-06-13 DIAGNOSIS — R002 Palpitations: Secondary | ICD-10-CM

## 2022-06-13 DIAGNOSIS — R000 Tachycardia, unspecified: Secondary | ICD-10-CM

## 2022-06-13 DIAGNOSIS — E039 Hypothyroidism, unspecified: Secondary | ICD-10-CM | POA: Diagnosis not present

## 2022-07-12 ENCOUNTER — Other Ambulatory Visit: Payer: Self-pay | Admitting: Student

## 2022-10-18 ENCOUNTER — Other Ambulatory Visit: Payer: Self-pay | Admitting: Cardiology

## 2022-10-18 MED ORDER — METOPROLOL SUCCINATE ER 50 MG PO TB24: ORAL_TABLET | ORAL | 0 refills | Status: DC

## 2022-12-22 ENCOUNTER — Other Ambulatory Visit: Payer: Self-pay | Admitting: Student

## 2023-08-13 ENCOUNTER — Emergency Department (HOSPITAL_BASED_OUTPATIENT_CLINIC_OR_DEPARTMENT_OTHER)
Admission: EM | Admit: 2023-08-13 | Discharge: 2023-08-13 | Disposition: A | Discharge status: Home / Self Care | Payer: 59 | Attending: Emergency Medicine | Admitting: Emergency Medicine

## 2023-08-13 ENCOUNTER — Encounter (HOSPITAL_BASED_OUTPATIENT_CLINIC_OR_DEPARTMENT_OTHER): Payer: Self-pay | Admitting: Emergency Medicine

## 2023-08-13 ENCOUNTER — Other Ambulatory Visit: Payer: Self-pay

## 2023-08-13 ENCOUNTER — Telehealth: Payer: Self-pay | Admitting: Cardiology

## 2023-08-13 DIAGNOSIS — R000 Tachycardia, unspecified: Secondary | ICD-10-CM | POA: Diagnosis not present

## 2023-08-13 DIAGNOSIS — R031 Nonspecific low blood-pressure reading: Secondary | ICD-10-CM | POA: Diagnosis not present

## 2023-08-13 DIAGNOSIS — R110 Nausea: Secondary | ICD-10-CM | POA: Insufficient documentation

## 2023-08-13 DIAGNOSIS — J189 Pneumonia, unspecified organism: Secondary | ICD-10-CM | POA: Insufficient documentation

## 2023-08-13 DIAGNOSIS — I499 Cardiac arrhythmia, unspecified: Secondary | ICD-10-CM | POA: Insufficient documentation

## 2023-08-13 DIAGNOSIS — B951 Streptococcus, group B, as the cause of diseases classified elsewhere: Secondary | ICD-10-CM | POA: Insufficient documentation

## 2023-08-13 DIAGNOSIS — R87612 Low grade squamous intraepithelial lesion on cytologic smear of cervix (LGSIL): Secondary | ICD-10-CM | POA: Insufficient documentation

## 2023-08-13 DIAGNOSIS — R0789 Other chest pain: Secondary | ICD-10-CM | POA: Insufficient documentation

## 2023-08-13 DIAGNOSIS — Z8709 Personal history of other diseases of the respiratory system: Secondary | ICD-10-CM | POA: Insufficient documentation

## 2023-08-13 DIAGNOSIS — J45909 Unspecified asthma, uncomplicated: Secondary | ICD-10-CM | POA: Insufficient documentation

## 2023-08-13 DIAGNOSIS — Z91040 Latex allergy status: Secondary | ICD-10-CM | POA: Insufficient documentation

## 2023-08-13 DIAGNOSIS — K589 Irritable bowel syndrome without diarrhea: Secondary | ICD-10-CM | POA: Insufficient documentation

## 2023-08-13 DIAGNOSIS — K601 Chronic anal fissure: Secondary | ICD-10-CM | POA: Insufficient documentation

## 2023-08-13 DIAGNOSIS — F32A Depression, unspecified: Secondary | ICD-10-CM | POA: Insufficient documentation

## 2023-08-13 DIAGNOSIS — O26899 Other specified pregnancy related conditions, unspecified trimester: Secondary | ICD-10-CM | POA: Insufficient documentation

## 2023-08-13 DIAGNOSIS — R42 Dizziness and giddiness: Secondary | ICD-10-CM | POA: Diagnosis present

## 2023-08-13 DIAGNOSIS — M1990 Unspecified osteoarthritis, unspecified site: Secondary | ICD-10-CM | POA: Insufficient documentation

## 2023-08-13 DIAGNOSIS — F419 Anxiety disorder, unspecified: Secondary | ICD-10-CM | POA: Insufficient documentation

## 2023-08-13 HISTORY — DX: Chronic anal fissure: K60.1

## 2023-08-13 LAB — BASIC METABOLIC PANEL
Anion gap: 8 (ref 5–15)
BUN: 12 mg/dL (ref 6–20)
CO2: 22 mmol/L (ref 22–32)
Calcium: 8.8 mg/dL — ABNORMAL LOW (ref 8.9–10.3)
Chloride: 108 mmol/L (ref 98–111)
Creatinine, Ser: 0.67 mg/dL (ref 0.44–1.00)
GFR, Estimated: >60 mL/min (ref >60)
Glucose, Bld: 89 mg/dL (ref 70–99)
Potassium: 3.9 mmol/L (ref 3.5–5.1)
Sodium: 138 mmol/L (ref 135–145)

## 2023-08-13 LAB — CBC
HCT: 42.7 % (ref 36.0–46.0)
Hemoglobin: 14.4 g/dL (ref 12.0–15.0)
MCH: 29.8 pg (ref 26.0–34.0)
MCHC: 33.7 g/dL (ref 30.0–36.0)
MCV: 88.2 fL (ref 80.0–100.0)
Platelets: 246 10*3/uL (ref 150–400)
RBC: 4.84 MIL/uL (ref 3.87–5.11)
RDW: 13 % (ref 11.5–15.5)
WBC: 6.3 10*3/uL (ref 4.0–10.5)
nRBC: 0 % (ref 0.0–0.2)

## 2023-08-13 MED ORDER — LACTATED RINGERS IV BOLUS
1000.0000 mL | Freq: Once | INTRAVENOUS | Status: AC
  Administered 2023-08-13: 1000 mL via INTRAVENOUS

## 2023-08-13 NOTE — Discharge Instructions (Signed)
It was our pleasure to provide your ER care today - we hope that you feel better.  Drink plenty of fluids/stay well hydrated.   Stop the metoprolol.  Follow up closely with your doctor/cardiologist in the next 1-2 weeks.  Return to ER if worse, new symptoms, fevers, new/severe pain, chest pain, trouble breathing, weak/fainting, or other concern.

## 2023-08-13 NOTE — Telephone Encounter (Signed)
Patient called the answering service this AM complaining of dizziness, tachycardia, and hypotension. She has been followed by Dr. Ladona Ridgel for sinus tachycardia, and has been taking metoprolol for years. Is currently prescribed metoprolol 50 mg at bedtime. Over the past 6 months, patient has intentionally lost about 40 lbs. For the past week or so, she has been feeling dizzy and has had low BP at home. On Saturday, she passed out briefly. She did not seek medication attention after her loss of consciousness. Due to her dizziness and low BP, she has reduced her metoprolol to 25 mg every evening.   Today, she woke up feeling very dizzy. She took her BP when she was lying down, and it was 89 systolic. Her HR was 112 BPM. She has continued to feel dizzy throughout the morning, and her BP was most recently 90/69. Her HR increased to the 120s. She denies pre syncope. Does have dizziness/lightheadedness, palpitations.   I checked the DOD schedule, but there were no slots for same day appointments. There were also no available appointments today with any APPs. As patient passed out on Saturday, continues to have dizziness and low BP, and is tachycardic, I instructed patient to come to an emergency department for evaluation. Suspect she will at least need IV fluids and medication adjustments. I instructed patient that she should not drive herself to the ED. Patient voiced understanding and her husband will take her to the ED.   Jonita Albee, PA-C 08/13/2023 7:22 AM

## 2023-08-13 NOTE — ED Provider Notes (Signed)
[place] EMERGENCY DEPARTMENT AT [facility] Provider Note   CSN: [id number] Arrival date & time: 08/13/23  4098     History  Chief Complaint  Patient presents with   Hypotension    Kim Kennedy is a 42 y.o. female.  Pt with c/o feeling intermittently weak/lightheaded when standing in past 1-2 weeks. Indicates hx sinus tachycardia, takes metoprolol for that, and has had ~ 40 lb intentional wt loss in past many months ('steady, stable, on GLP-1), but now bp has been trending low. 5 days ago got up at night to use bathroom, and felt lightheaded on standing, had brief syncopal event, was able to get self up and go back to bed. No other syncope.  Denies any pain or injury since. This AM, on wrist monitor, hr laying was in 90s and bp low 100, but on standing HR increased/BP decreased, and call her cardiology PA who advised her to come to ED. Is eating/drinking. No nvd. No abd pain. No fever or chills. No uri symptoms. No dysuria or gu c/o. No blood loss, rectal bleeding/melena or vaginal bleeding. No recent change in meds other than just recently dropped metoprolol from 50 mg to 25 mg.   The history is provided by the patient and medical records.       Home Medications Prior to Admission medications   Medication Sig Start Date End Date Taking? Authorizing Provider  cyclobenzaprine (FLEXERIL) 10 MG tablet Take 10 mg by mouth every evening.  09/17/18   [provider]  MAGNESIUM PO Take 6 tablets by mouth daily.    [provider]  metoprolol succinate (TOPROL-XL) 50 MG 24 hr tablet TAKE 1 TABLET BY MOUTH IN THE  EVENING 12/22/22   Marinus Maw, MD  Multiple Vitamin (MULTIVITAMIN WITH MINERALS) TABS tablet Take 1 tablet by mouth every evening.    [provider]  Omega-3 Fatty Acids (FISH OIL) 1000 MG CAPS Take 3 capsules by mouth daily.    [provider]  oxyCODONE-acetaminophen (PERCOCET/ROXICET) 5-325 MG tablet Take 1 tablet by mouth  every 6 (six) hours as needed for moderate pain (fribromylagia).    [provider]  polyethylene glycol (MIRALAX / GLYCOLAX) 17 g packet Take 17 g by mouth daily as needed for mild constipation. 07/04/20   Maczis, Elmer Sow, PA-C  Probiotic Product (PROBIOTIC DAILY PO) Take 1 tablet by mouth every evening.     [provider]  sertraline (ZOLOFT) 50 MG tablet Take 50 mg by mouth daily. 05/30/20   [provider]  traMADol (ULTRAM) 50 MG tablet Take 1 tablet (50 mg total) by mouth every 6 (six) hours as needed for severe pain. 07/04/20   Maczis, Elmer Sow, PA-C      Allergies    Erythromycin, Latex, Zithromax [azithromycin], Augmentin [amoxicillin-pot clavulanate], Dilaudid [hydromorphone], Nitrofurantoin monohyd macro, and Penicillins    Review of Systems   Review of Systems  Constitutional:  Negative for chills and fever.  HENT:  Negative for sore throat.   Respiratory:  Negative for cough and shortness of breath.   Cardiovascular:  Negative for chest pain and leg swelling.  Gastrointestinal:  Negative for abdominal pain, blood in stool, diarrhea, nausea and vomiting.  Genitourinary:  Negative for dysuria and flank pain.  Musculoskeletal:  Negative for back pain and neck pain.  Skin:  Negative for rash.  Neurological:  Positive for light-headedness.    Physical Exam Updated Vital Signs BP (!) 96/59   Pulse 80  Temp 98 F (36.7 C) (Oral)   Resp 17   LMP 02/06/2020   SpO2 93%  Physical Exam Vitals and nursing note reviewed.  Constitutional:      Appearance: Normal appearance. She is well-developed.  HENT:     Head: Atraumatic.     Nose: Nose normal.     Mouth/Throat:     Mouth: Mucous membranes are moist.  Eyes:     General: No scleral icterus.    Conjunctiva/sclera: Conjunctivae normal.  Neck:     Trachea: No tracheal deviation.  Cardiovascular:     Rate and Rhythm: Normal rate and regular rhythm.     Pulses: Normal pulses.     Heart sounds:  Normal heart sounds. No murmur heard.    No friction rub. No gallop.  Pulmonary:     Effort: Pulmonary effort is normal. No respiratory distress.     Breath sounds: Normal breath sounds.  Abdominal:     General: There is no distension.     Palpations: Abdomen is soft.     Tenderness: There is no abdominal tenderness.  Musculoskeletal:        General: No swelling.     Cervical back: Normal range of motion and neck supple. No rigidity. No muscular tenderness.     Right lower leg: No edema.     Left lower leg: No edema.  Skin:    General: Skin is warm and dry.     Findings: No rash.  Neurological:     Mental Status: She is alert.     Comments: Alert, speech normal. Motor/sens grossly intact bil. Steady gait.   Psychiatric:        Mood and Affect: Mood normal.     ED Results / Procedures / Treatments   Labs (all labs ordered are listed, but only abnormal results are displayed) Results for orders placed or performed during the hospital encounter of 08/13/23  Basic metabolic panel  Result Value Ref Range   Sodium 138 135 - 145 mmol/L   Potassium 3.9 3.5 - 5.1 mmol/L   Chloride 108 98 - 111 mmol/L   CO2 22 22 - 32 mmol/L   Glucose, Bld 89 70 - 99 mg/dL   BUN 12 6 - 20 mg/dL   Creatinine, Ser 9.81 0.44 - 1.00 mg/dL   Calcium 8.8 (L) 8.9 - 10.3 mg/dL   GFR, Estimated >19 >14 mL/min   Anion gap 8 5 - 15  CBC  Result Value Ref Range   WBC 6.3 4.0 - 10.5 K/uL   RBC 4.84 3.87 - 5.11 MIL/uL   Hemoglobin 14.4 12.0 - 15.0 g/dL   HCT 78.2 95.6 - 21.3 %   MCV 88.2 80.0 - 100.0 fL   MCH 29.8 26.0 - 34.0 pg   MCHC 33.7 30.0 - 36.0 g/dL   RDW 08.6 57.8 - 46.9 %   Platelets 246 150 - 400 K/uL   nRBC 0.0 0.0 - 0.2 %     EKG EKG Interpretation Date/Time:  Thursday August 13 2023 08:27:37 EDT Ventricular Rate:  97 PR Interval:  143 QRS Duration:  85 QT Interval:  359 QTC Calculation: 456 R Axis:   66  Text Interpretation: Sinus rhythm Confirmed by Cathren Laine ([id number]) on  08/13/2023 8:37:29 AM  Radiology No results found.  Procedures Procedures    Medications Ordered in ED Medications  lactated ringers bolus 1,000 mL (0 mLs Intravenous Stopped 08/13/23 1012)    ED Course/ Medical Decision Making/ A&P  Medical Decision Making Problems Addressed: Low blood pressure, not hypotension: acute illness or injury with systemic symptoms that poses a threat to life or bodily functions Sinus tachycardia: chronic illness or injury with exacerbation, progression, or side effects of treatment    Details: Acute/chronic  Amount and/or Complexity of Data Reviewed External Data Reviewed: notes. Labs: ordered. Decision-making details documented in ED Course. ECG/medicine tests: ordered and independent interpretation performed. Decision-making details documented in ED Course.  Risk Prescription drug management. Decision regarding hospitalization.   Iv ns. Continuous pulse ox and cardiac monitoring. Labs ordered/sent.  Differential diagnosis includes syncope, dehydration, anemia, aki, etc. Dispo decision including potential need for admission considered - will get labs, give ivf, and reassess.   Reviewed nursing notes and prior charts for additional history. External reports reviewed.   Pt indicates her cardiology office suggested she come to ED for ecg, ivf, etc.   LR bolus.   Cardiac monitor: sinus rhythm, rate 90.  Labs reviewed/interpreted by me - wbc and hgb normal. Chem normal.  Recheck, bp 98/65, p 86. No faintness.   Pt appears stable for d/c.  Rec close pcp f/u.  Return precautions provided.          Final Clinical Impression(s) / ED Diagnoses Final diagnoses:  None    Rx / DC Orders ED Discharge Orders     None         Cathren Laine, MD 08/13/23 1026

## 2023-08-13 NOTE — Progress Notes (Signed)
Cardiology Office Note:  .   Date:  08/13/2023  ID:  Kim Kennedy, DOB February 11, 1981, MRN [id number] PCP: Soundra Pilon, FNP   HeartCare Providers Cardiologist:  None Electrophysiologist:  Lewayne Bunting, MD    History of Present Illness: .    Kim Kennedy is a 42 y.o. female with a past medical history of IBS, palpitations, anxiety, tachycardia.  Evaluated by EP on 06/06/2022, was doing stable from a cardiac perspective, her palpitations were well-controlled as well as her tachycardia.  She was advised to follow-up in 12 months.  Evaluated in the emergency department on 08/13/2023 for hypotension.  Reported that she had intentionally lost 40 pounds over the previous month, her blood pressure been trending low, she apparently was advised to follow-up in the emergency department.  BMET was normal, CBC was normal.  She presents today accompanied by her husband for follow up of her tachycardia. A few weeks ago she had a syncopal episode during the night. She has a BP cuff, and is able to check her BP. She has recently lost a significant amount of weight and this is likely attributing to her recurrence of what sounds like dysautonomia. She is tachycardiac, however her metoprolol is dropping her BP too low. While she is tachycardiac she feels some chest heaviness. She denies chest pain, palpitations, dyspnea, pnd, orthopnea, n, v, dizziness, edema, weight gain, or early satiety.   ROS: Review of Systems  Constitutional:  Positive for malaise/fatigue.  HENT: Negative.    Eyes: Negative.   Respiratory: Negative.    Cardiovascular:  Positive for palpitations.  Gastrointestinal: Negative.   Genitourinary: Negative.   Musculoskeletal: Negative.   Skin: Negative.   Neurological:  Positive for dizziness.  Endo/Heme/Allergies: Negative.   Psychiatric/Behavioral: Negative.       Studies Reviewed: .            Risk Assessment/Calculations:             Physical Exam:   VS:  LMP  02/06/2020    Wt Readings from Last 3 Encounters:  06/13/22 187 lb 6.4 oz (85 kg)  12/14/20 175 lb (79.4 kg)  07/02/20 175 lb (79.4 kg)    GEN: Well nourished, well developed in no acute distress NECK: No JVD; No carotid bruits CARDIAC: Regular but elevated, no murmurs, rubs, gallops RESPIRATORY:  Clear to auscultation without rales, wheezing or rhonchi  ABDOMEN: Soft, non-tender, non-distended EXTREMITIES:  No edema; No deformity   ASSESSMENT AND PLAN: .   Palpitations/Tachycardia/dysautonomia -formally follows with EP, has been on a beta-blocker for many years and typically well-controlled her palpitations.  She has not been able to take it recently as it was causing her blood pressure to drop too low, to the point she has passed out. We discussed compression stockings, abdominal binder, maintaining adequate fluid intake, liberalizing her salt intake--she is drinking adequate water.  Will see if she can tolerate Ivabradine 5 mg twice daily as this will help regulate her tachycardia without dropping her blood pressure.  Weight loss - intentionally losing weight, however it appears this is likely contributing to her drop in blood pressure. Heart healthy diet and regular cardiovascular exercise encouraged.         Dispo: Ivabradine 5 mg twice daily, follow up with EP in 1 month.   Signed, Flossie Dibble, NP

## 2023-08-13 NOTE — ED Triage Notes (Signed)
Pt adjusted her metoprolol about a week ago ,cut it in half, due to 40 lb weight loss and she has been having feelings of chest pounding, hr 112 resting, syncopal. Her cards is aware and rec ED

## 2023-08-13 NOTE — ED Notes (Signed)
Dc instructions reviewed with patient. Patient voiced understanding. Dc with belongings.

## 2023-08-14 ENCOUNTER — Ambulatory Visit: Payer: 59 | Attending: Cardiology | Admitting: Cardiology

## 2023-08-14 ENCOUNTER — Encounter: Payer: Self-pay | Admitting: Cardiology

## 2023-08-14 VITALS — BP 104/74 | HR 110 | Ht 64.0 in | Wt 153.6 lb

## 2023-08-14 DIAGNOSIS — G901 Familial dysautonomia [Riley-Day]: Secondary | ICD-10-CM

## 2023-08-14 DIAGNOSIS — E039 Hypothyroidism, unspecified: Secondary | ICD-10-CM | POA: Diagnosis not present

## 2023-08-14 DIAGNOSIS — R002 Palpitations: Secondary | ICD-10-CM

## 2023-08-14 DIAGNOSIS — R000 Tachycardia, unspecified: Secondary | ICD-10-CM | POA: Diagnosis not present

## 2023-08-14 MED ORDER — IVABRADINE HCL 5 MG PO TABS: 5.0000 mg | ORAL_TABLET | Freq: Two times a day (BID) | ORAL | 11 refills | Status: DC

## 2023-08-14 NOTE — Patient Instructions (Signed)
Medication Instructions:  Your physician has recommended you make the following change in your medication:  Start Ivabradine two times daily for rapid Heart rate  *If you need a refill on your cardiac medications before your next appointment, please call your pharmacy*   Lab Work: NONE If you have labs (blood work) drawn today and your tests are completely normal, you will receive your results only by: MyChart Message (if you have MyChart) OR A paper copy in the mail If you have any lab test that is abnormal or we need to change your treatment, we will call you to review the results.   Testing/Procedures: NONE   Follow-Up: At [facility], you and your health needs are our priority.  As part of our continuing mission to provide you with exceptional heart care, we have created designated Provider Care Teams.  These Care Teams include your primary Cardiologist (physician) and Advanced Practice Providers (APPs -  Physician Assistants and Nurse Practitioners) who all work together to provide you with the care you need, when you need it.  We recommend signing up for the patient portal called "MyChart".  Sign up information is provided on this After Visit Summary.  MyChart is used to connect with patients for Virtual Visits (Telemedicine).  Patients are able to view lab/test results, encounter notes, upcoming appointments, etc.  Non-urgent messages can be sent to your provider as well.   To learn more about what you can do with MyChart, go to [email or web address].    Your next appointment:   1 month(s)  Provider:  Dr. Lanna Poche None     Other Instructions

## 2023-08-20 ENCOUNTER — Telehealth: Payer: Self-pay | Admitting: Student

## 2023-08-20 NOTE — Telephone Encounter (Signed)
Called pt in regards to HR.  Reports started taking ivabradine on Monday.  HR with normal walking is 128.  Feels shaky, hot and heart is pounding.  Wants to know if this is normal and if med has had enough time to get into her system.  BP 120/80's.  Advised will send to provider to f/u.  Pt had OV on 08/14/23.  Metoprolol stopped d/t low BP and passing out.

## 2023-08-20 NOTE — Telephone Encounter (Signed)
BP today has been good 12/80 and 118/82  HR 97-128 today No compression socks since BP is better. Pt states she feels worse today since her hr is high.  Pt states she will stop the Metoprolol until she receives new instructions.

## 2023-08-20 NOTE — Telephone Encounter (Signed)
Pt c/o medication issue:  1. Name of Medication: ivabradine (CORLANOR) 5 MG TABS tablet   2. How are you currently taking this medication (dosage and times per day)? As written   3. Are you having a reaction (difficulty breathing--STAT)? No   4. What is your medication issue?  Pt states since taking this medication her hr has not went down. She states she is "shaky" and just do not feel well. 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

## 2023-08-20 NOTE — Telephone Encounter (Signed)
Patient is returning call.  °

## 2023-08-20 NOTE — Telephone Encounter (Signed)
Left vm to call back

## 2023-08-21 NOTE — Telephone Encounter (Signed)
Recommendations reviewed with pt as per Wallis Bamberg, NP's note.  Pt verbalized understanding and had no additional questions.

## 2023-08-21 NOTE — Telephone Encounter (Addendum)
Spoke with pt who states that the Ivabradine was the medication that made her feel bad and she has stopped it. Pt states that last pm at 1930 her HR was high and she took 1/4 of her Metoprolol, drank fluids and wore her compression hose all night and her BP this am was 101/59. Pt states after drinking fluids her BP is now 116/75 HR 89 and she has not taken any medication for her heart rate since last pm. She would like to know her next steps since she doesn't see Tillery until 09/22/23.

## 2023-08-21 NOTE — Addendum Note (Signed)
Addended by: Eleonore Chiquito on: 08/21/2023 10:56 AM   Modules accepted: Orders

## 2023-09-03 ENCOUNTER — Encounter: Payer: Self-pay | Admitting: *Deleted

## 2023-09-04 ENCOUNTER — Encounter: Payer: Self-pay | Admitting: Cardiology

## 2023-09-04 ENCOUNTER — Ambulatory Visit: Payer: 59 | Attending: Student | Admitting: Cardiology

## 2023-09-04 VITALS — BP 116/87 | HR 116 | Ht 64.0 in | Wt 150.0 lb

## 2023-09-04 DIAGNOSIS — R002 Palpitations: Secondary | ICD-10-CM | POA: Diagnosis not present

## 2023-09-04 DIAGNOSIS — R000 Tachycardia, unspecified: Secondary | ICD-10-CM

## 2023-09-04 MED ORDER — METOPROLOL SUCCINATE ER 25 MG PO TB24: 25.0000 mg | ORAL_TABLET | Freq: Every day | ORAL | 3 refills | Status: DC

## 2023-09-04 NOTE — Progress Notes (Signed)
Cardiology Office Note Date:  09/04/2023  Patient ID:  Kim Kennedy, DOB August 12, 1981, MRN [id number] PCP:  Soundra Pilon, FNP  Cardiologist:  None Electrophysiologist: Lewayne Bunting, MD    Chief Complaint: palpitations  History of Present Illness: Kim Kennedy is a 42 y.o. female with PMH notable for sinus tachycardia, palpitations, hypothyroid; seen today for Lewayne Bunting, MD for acute visit due to palpitations.   She last saw PA Tillery 05/2022, palpitations reasonably well-controlled on toprol 50mg  daily. She went to ER 9/19 for hypotension and dizziness. Labs nl, BP borderline. Toprol stopped. After this she was having more palpitations and so cut her 50mg  toprol tabs into 1/4 and took one everyday. She saw NP Woody in follow-up, toprol again stopped and ivabradine was started. She sent mychart message to clinic afterwards that she was feeling shaky, hot, hypotensive, and HR was racing, so she stopped ivabradine and restarted 1/2 of a 50mg  toprol.   On follow-up today, she had particularly bad day yesterday of palpitations and dizziness which prompted the request for an appointment. She is an Production designer, theatre/television/film at [employer] and so is very active during the day walking. She drinks copious amounts of water throughout the day, but does not snack. She has compression socks, but does not wear daily. She has intentionally lost 40lbs over the past few months with mounjaro, and questions whether the weight loss has triggered the increase in palpitations.   Currently, she denies chest pain, palpitations, dyspnea, PND, orthopnea, nausea, vomiting, dizziness, syncope, edema, weight gain, or early satiety.     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   IBS (irritable bowel syndrome)    diet controlled   Inactive TB 2011   chest xray neg   LGSIL of cervix of undetermined significance    Nausea    patient denies   Osteoarthritis    Palpitations 08/30/2014   Pneumonia    Symptomatic cholelithiasis 07/02/2020   Tachycardia    controlled with lopressor, no problems   Third trimester pregnancy at less than 36 weeks 11/22/2014    Past Surgical History:  Procedure Laterality Date   ABDOMINAL HYSTERECTOMY     CESAREAN SECTION N/A 12/26/2014   Procedure: Primary CESAREAN SECTION;  Surgeon: Lenoard Aden, MD;  Location: [facility];  Service: Obstetrics;  Laterality: N/A;  EDD: 12/23/14   CHOLECYSTECTOMY N/A 07/03/2020   Procedure: LAPAROSCOPIC CHOLECYSTECTOMY WITH LIVER BIOPSY;  Surgeon: Romie Levee, MD;  Location: [facility];  Service: General;  Laterality: N/A;   COLPOSCOPY W/ BIOPSY / CURETTAGE     DILATION AND EVACUATION N/A 12/26/2014   Procedure: DILATATION AND EVACUATION with Bakri Balloon Placement;  Surgeon: Lenoard Aden, MD;  Location: [facility];  Service: Gynecology;  Laterality: N/A;   LAPAROSCOPIC LYSIS OF ADHESIONS N/A 02/27/2014  Procedure: LAPAROSCOPIC LYSIS OF ADHESIONS;  Surgeon: Lenoard Aden, MD;  Location: [facility];  Service: Gynecology;  Laterality: N/A;   LAPAROSCOPY N/A 02/27/2014   Procedure: LAPAROSCOPY DIAGNOSTIC;  Surgeon: Lenoard Aden, MD;  Location: [facility];  Service: Gynecology;  Laterality: N/A;   MANDIBLE FRACTURE SURGERY  03/2001   x 7 jaw surgeries. TMJ   NASAL SEPTUM SURGERY  03/2001   ROBOTIC ASSISTED LAPAROSCOPIC HYSTERECTOMY AND SALPINGECTOMY Bilateral 04/19/2020   Procedure: XI ROBOTIC ASSISTED  LAPAROSCOPIC HYSTERECTOMY AND SALPINGECTOMY lysis of left adnexa to left sigmoid mesentery adhesions, Rogelio Seen culdoplasty.;  Surgeon: Olivia Mackie, MD;  Location: [facility];  Service: Gynecology;  Laterality: Bilateral;   tear duct surgery     at 53 months old - right eye   TONSILLECTOMY AND ADENOIDECTOMY  2000   WISDOM TOOTH EXTRACTION      Current Outpatient Medications  Medication Instructions   cyclobenzaprine (FLEXERIL) 10 mg, Oral, Every evening   MAGNESIUM PO 6 tablets, Oral, Daily   metoprolol succinate (TOPROL-XL) 50 mg, Oral, Daily   Multiple Vitamin (MULTIVITAMIN WITH MINERALS) TABS tablet 1 tablet, Oral, Every evening   Naltrexone HCl (Pain) 4.5 mg, Oral, Daily   Omega-3 Fatty Acids (FISH OIL) 1000 MG CAPS 3 capsules, Oral, Daily   oxyCODONE-acetaminophen (PERCOCET/ROXICET) 5-325 MG tablet 1 tablet, Oral, Every 6 hours PRN   polyethylene glycol (MIRALAX / GLYCOLAX) 17 g, Oral, Daily PRN   Probiotic Product (PROBIOTIC DAILY PO) 1 tablet, Oral, Every evening   sertraline (ZOLOFT) 50 mg, Oral, Daily   tirzepatide (MOUNJARO) 7.5 mg, Subcutaneous, Weekly   traMADol (ULTRAM) 50 mg, Oral, Every 6 hours PRN    Social History:  The patient  reports that she has never smoked. She has never used smokeless tobacco. She reports current alcohol use. She reports that she does not use drugs.   Family History:  The patient's family history includes Anxiety disorder in her mother; Diabetes in her paternal uncle; Fibromyalgia in her mother; Heart attack in her maternal grandfather, maternal grandmother, paternal grandfather, and paternal grandmother; Hepatitis C in her mother; Hyperlipidemia in her father; Narcolepsy in her father; Osteoporosis in her mother and paternal grandmother; Thrombocytopenia in her mother.  ROS:  Please see the history of present illness. All other systems are reviewed and otherwise negative.   PHYSICAL EXAM:  VS:  BP 116/87 (BP Location: Left Arm,  Cuff Size: Normal)   Pulse (!) 116   Ht 5\' 4"  (1.626 m)   Wt 150 lb (68 kg)   LMP 02/06/2020   SpO2 99%   BMI 25.75 kg/m  BMI: Body mass index is 25.75 kg/m.  Orthostatic VS for the past 24 hrs (Last 3 readings):  BP- Lying Pulse- Lying BP- Sitting Pulse- Sitting BP- Standing at 0 minutes Pulse- Standing at 0 minutes BP- Standing at 3 minutes Pulse- Standing at 3 minutes  09/04/23 1450 -- -- -- -- -- -- 116/87 116  09/04/23 1449 -- -- -- -- 117/78 110 -- --  09/04/23 1448 -- -- 114/77 102 -- -- -- --  09/04/23 1447 108/80 94 -- -- -- -- -- --     GEN- The patient is well appearing, alert and oriented x 3 today.   Lungs- Clear to ausculation bilaterally, normal work of breathing.  Heart- Regular, tachycardic rate and rhythm, no murmurs, rubs or gallops Extremities- No peripheral edema, warm, dry   EKG is ordered. Personal review of EKG from today shows:    EKG Interpretation  Date/Time:  Friday September 04 2023 14:37:28 EDT Ventricular Rate:  96 PR Interval:  142 QRS Duration:  74 QT Interval:  360 QTC Calculation: 454 R Axis:   72  Text Interpretation: Normal sinus rhythm Confirmed by Sherie Don [phone]) on 09/04/2023 2:44:25 PM    Recent Labs: 08/13/2023: BUN 12; Creatinine, Ser 0.67; Hemoglobin 14.4; Platelets 246; Potassium 3.9; Sodium 138  No results found for requested labs within last 365 days.   CrCl cannot be calculated (Patient's most recent lab result is older than the maximum 21 days allowed.).   Wt Readings from Last 3 Encounters:  08/14/23 153 lb 9.6 oz (69.7 kg)  06/13/22 187 lb 6.4 oz (85 kg)  12/14/20 175 lb (79.4 kg)     Additional studies reviewed include: Previous EP, cardiology notes.     ASSESSMENT AND PLAN:  #) Dysautonomia #) palpitations #) sinus tachycardia Discussed medications, specifically whether to swtich from 25mg  toprol to propanolol. At this time, patient preferred to continue 25mg  toprol with lifestyle modifications.  We  discussed the role of salt and water repletion, the importance of exercise, often needing to be started in the recumbent position, and the awareness of triggers and the role of ambient heat and dehydration. Discussed salt substitutes with a  goal of 2-3 gms of Sodium or 5-7 gm of sodium Chloride daily. Rehydration solutions include Liquid IV,  Salt supplements are best used with adjunctive sugar         Current medicines are reviewed at length with the patient today.   The patient has concerns regarding her medicines.  The following changes were made today:  continue 25mg  toprol  Labs/ tests ordered today include:  No orders of the defined types were placed in this encounter.    Disposition: Follow up with Dr. Ladona Ridgel or EP APP in 3 months   Signed, Sherie Don, NP  09/04/23  8:47 AM  Electrophysiology [facility]

## 2023-09-04 NOTE — Patient Instructions (Signed)
Medication Instructions:  The current medical regimen is effective;  continue present plan and medications.  *If you need a refill on your cardiac medications before your next appointment, please call your pharmacy*   Follow-Up: At [facility], you and your health needs are our priority.  As part of our continuing mission to provide you with exceptional heart care, we have created designated Provider Care Teams.  These Care Teams include your primary Cardiologist (physician) and Advanced Practice Providers (APPs -  Physician Assistants and Nurse Practitioners) who all work together to provide you with the care you need, when you need it.  We recommend signing up for the patient portal called "MyChart".  Sign up information is provided on this After Visit Summary.  MyChart is used to connect with patients for Virtual Visits (Telemedicine).  Patients are able to view lab/test results, encounter notes, upcoming appointments, etc.  Non-urgent messages can be sent to your provider as well.   To learn more about what you can do with MyChart, go to [email or web address].    Your next appointment:   3 month(s)  Provider:   Lewayne Bunting, MD or Francis Dowse, PA-C    Other Instructions  Discussed salt substitutes with a  goal of 7-10 gms of Sodium or 12-15 gm of sodium Chloride daily.  Rehydration solutions include Liquid IV, NUUN, TriOral, Normralyte pedialyte advanced care and Banana Bags.  Salt tablets include plain salt tablets, SaltStick Vitassium, thermatabs amongst others.   The higher sodium concentration per serving on this list Within You hydration formula also at 500 mg normalyte about 800 mg of sodium per serving [id number] mg     Salt supplements are best used with adjunctive sugar  Also discussed the role of compression wear including thigh sleeves and abdominal binders.  Calf compression is not specifically recommended.Marland Kitchen

## 2023-09-16 ENCOUNTER — Telehealth: Payer: Self-pay | Admitting: Internal Medicine

## 2023-09-16 NOTE — Telephone Encounter (Signed)
If her symptoms continue she will need to go to the ED.

## 2023-09-16 NOTE — Telephone Encounter (Signed)
Called patient back about her message. Patient stated she is not doing well with the reduced dose of metoprolol.  Patient stated her BP is better, but she is not getting enough sleep. Patient stated she had SOB, low grade headache, nausea, and her chest feels heavy and she just cannot get a deep breath. Patient stated she keeps waking up with a racing heart, HR 114. Patient stated she has follow-up with Dr. Ladona Ridgel in the beginning of next year, but she stated she will not last that long. Will send message to Dr. Ladona Ridgel and his nurse.

## 2023-09-16 NOTE — Telephone Encounter (Signed)
Patient c/o Palpitations:  STAT if patient reporting lightheadedness, shortness of breath, or chest pain  How long have you had palpitations/irregular HR/ Afib? Are you having the symptoms now?   Yes - chest discomfort  Are you currently experiencing lightheadedness, SOB or CP?   A little lightheadness  Do you have a history of afib (atrial fibrillation) or irregular heart rhythm?   Yes  Have you checked your BP or HR? (document readings if available):   HR 107 (while sitting at work). BP not available  Are you experiencing any other symptoms?  Nausea, eye strain, dull low grade headache  Patient stated she can feel the "pounding in her chest" and is very uncomfortable.  Patient stated she did not think the lower dosage of her metoprolol succinate (TOPROL-XL) 25 MG 24 hr tablet is not working for her.

## 2023-09-17 NOTE — Telephone Encounter (Signed)
Called Pt. Someone picked up I could hear talking and other noise but no one answered.

## 2023-09-18 NOTE — Telephone Encounter (Signed)
Patient is returning call.  °

## 2023-09-18 NOTE — Telephone Encounter (Signed)
Spoke with patient and she states she is not doing any better. Did advise provider recommendations to go to ED

## 2023-09-22 ENCOUNTER — Ambulatory Visit: Payer: 59 | Admitting: Student

## 2023-09-23 ENCOUNTER — Emergency Department (HOSPITAL_COMMUNITY)
Admission: EM | Admit: 2023-09-23 | Discharge: 2023-09-23 | Disposition: A | Discharge status: Home / Self Care | Payer: 59 | Attending: Emergency Medicine | Admitting: Emergency Medicine

## 2023-09-23 ENCOUNTER — Other Ambulatory Visit: Payer: Self-pay

## 2023-09-23 ENCOUNTER — Emergency Department (HOSPITAL_COMMUNITY): Payer: 59

## 2023-09-23 DIAGNOSIS — R002 Palpitations: Secondary | ICD-10-CM | POA: Diagnosis not present

## 2023-09-23 DIAGNOSIS — R519 Headache, unspecified: Secondary | ICD-10-CM | POA: Diagnosis not present

## 2023-09-23 DIAGNOSIS — Z91040 Latex allergy status: Secondary | ICD-10-CM | POA: Insufficient documentation

## 2023-09-23 DIAGNOSIS — R0602 Shortness of breath: Secondary | ICD-10-CM | POA: Insufficient documentation

## 2023-09-23 DIAGNOSIS — R5383 Other fatigue: Secondary | ICD-10-CM | POA: Insufficient documentation

## 2023-09-23 DIAGNOSIS — Z79899 Other long term (current) drug therapy: Secondary | ICD-10-CM | POA: Insufficient documentation

## 2023-09-23 DIAGNOSIS — R0789 Other chest pain: Secondary | ICD-10-CM

## 2023-09-23 DIAGNOSIS — R072 Precordial pain: Secondary | ICD-10-CM | POA: Insufficient documentation

## 2023-09-23 LAB — CBC WITH DIFFERENTIAL/PLATELET
Abs Immature Granulocytes: 0.02 10*3/uL (ref 0.00–0.07)
Basophils Absolute: 0.1 10*3/uL (ref 0.0–0.1)
Basophils Relative: 1 %
Eosinophils Absolute: 0 10*3/uL (ref 0.0–0.5)
Eosinophils Relative: 0 %
HCT: 45.1 % (ref 36.0–46.0)
Hemoglobin: 14.8 g/dL (ref 12.0–15.0)
Immature Granulocytes: 0 %
Lymphocytes Relative: 29 %
Lymphs Abs: 2.2 10*3/uL (ref 0.7–4.0)
MCH: 29.5 pg (ref 26.0–34.0)
MCHC: 32.8 g/dL (ref 30.0–36.0)
MCV: 89.8 fL (ref 80.0–100.0)
Monocytes Absolute: 0.6 10*3/uL (ref 0.1–1.0)
Monocytes Relative: 7 %
Neutro Abs: 4.7 10*3/uL (ref 1.7–7.7)
Neutrophils Relative %: 63 %
Platelets: 291 10*3/uL (ref 150–400)
RBC: 5.02 MIL/uL (ref 3.87–5.11)
RDW: 12.7 % (ref 11.5–15.5)
WBC: 7.5 10*3/uL (ref 4.0–10.5)
nRBC: 0 % (ref 0.0–0.2)

## 2023-09-23 LAB — COMPREHENSIVE METABOLIC PANEL
ALT: 31 U/L (ref 0–44)
AST: 23 U/L (ref 15–41)
Albumin: 4.2 g/dL (ref 3.5–5.0)
Alkaline Phosphatase: 40 U/L (ref 38–126)
Anion gap: 8 (ref 5–15)
BUN: 19 mg/dL (ref 6–20)
CO2: 28 mmol/L (ref 22–32)
Calcium: 9.5 mg/dL (ref 8.9–10.3)
Chloride: 105 mmol/L (ref 98–111)
Creatinine, Ser: 0.68 mg/dL (ref 0.44–1.00)
GFR, Estimated: >60 mL/min (ref >60)
Glucose, Bld: 86 mg/dL (ref 70–99)
Potassium: 4 mmol/L (ref 3.5–5.1)
Sodium: 141 mmol/L (ref 135–145)
Total Bilirubin: 0.5 mg/dL (ref 0.3–1.2)
Total Protein: 7.2 g/dL (ref 6.5–8.1)

## 2023-09-23 LAB — MAGNESIUM: Magnesium: 2.3 mg/dL (ref 1.7–2.4)

## 2023-09-23 LAB — TSH: TSH: 1.866 u[IU]/mL (ref 0.350–4.500)

## 2023-09-23 LAB — D-DIMER, QUANTITATIVE: D-Dimer, Quant: <0.27 ug{FEU}/mL (ref 0.00–0.50)

## 2023-09-23 LAB — TROPONIN I (HIGH SENSITIVITY)
Troponin I (High Sensitivity): 3 ng/L (ref <18)
Troponin I (High Sensitivity): <2 ng/L (ref <18)

## 2023-09-23 MED ORDER — DIPHENHYDRAMINE HCL 50 MG/ML IJ SOLN
50.0000 mg | Freq: Once | INTRAMUSCULAR | Status: AC
  Administered 2023-09-23: 50 mg via INTRAVENOUS
  Filled 2023-09-23: qty 1

## 2023-09-23 MED ORDER — SODIUM CHLORIDE 0.9 % IV BOLUS
1000.0000 mL | Freq: Once | INTRAVENOUS | Status: AC
  Administered 2023-09-23: 1000 mL via INTRAVENOUS

## 2023-09-23 MED ORDER — PROCHLORPERAZINE EDISYLATE 10 MG/2ML IJ SOLN
10.0000 mg | Freq: Once | INTRAMUSCULAR | Status: AC
  Administered 2023-09-23: 10 mg via INTRAVENOUS
  Filled 2023-09-23: qty 2

## 2023-09-23 NOTE — ED Notes (Signed)
This Clinical research associate contacted lab and spoke with Selena Batten in regards to PT's blood work needing to be ran. Same states she had it in front of her and would be running it.

## 2023-09-23 NOTE — ED Triage Notes (Signed)
PT. STATED, Ive had chest pain this morning and its more like tightness in my chest with SOB  and now its sort of went away with a splitting headache. We have been messing with my medication for tackycardia. We have also talked about POTTS

## 2023-09-23 NOTE — Discharge Instructions (Signed)
Your history, exam, workup today was overall reassuring.  Your cardiac enzymes were negative both times we checked them and your blood clot test called a D-dimer was also negative.  The other labs were overall reassuring as we went through together.  Your x-ray did not show concerning findings and your vital signs remained reassuring for over 5-1/2 hours.  You did have intermittent tachycardia which has been a problem for you over the recent months and years.  We feel you are safe for discharge home given your stability and otherwise well appearance.  Your symptoms improved and we feel you are safe to go call your outpatient cardiology team to discuss medication changes.  If any symptoms change or worsen acutely, please return to the nearest emergency department.

## 2023-09-23 NOTE — ED Notes (Signed)
This Clinical research associate made contact with Morrell Riddle from lab once again in regards to PT's blood work, same stated she had just started processing it.

## 2023-09-23 NOTE — ED Provider Notes (Signed)
[place] EMERGENCY DEPARTMENT AT [facility] Provider Note   CSN: [id number] Arrival date & time: 09/23/23  1008     History  Chief Complaint  Patient presents with   Chest Pain   Shortness of Breath   Headache    Kim Kennedy is a 42 y.o. female.  The history is provided by the patient, medical records and the spouse. No language interpreter was used.  Chest Pain Pain location:  Substernal area Pain quality: aching and tightness   Pain radiates to:  Does not radiate Pain severity:  Moderate Onset quality:  Gradual Duration:  1 day Timing:  Constant Progression:  Resolved Chronicity:  New Context: stress   Context: not trauma   Relieved by:  Nothing Worsened by:  Nothing Ineffective treatments:  None tried Associated symptoms: fatigue, headache, palpitations and shortness of breath   Associated symptoms: no abdominal pain, no altered mental status, no back pain, no cough, no diaphoresis, no dizziness, no fever, no lower extremity edema, no nausea, no near-syncope, no numbness, no vomiting and no weakness   Risk factors: no prior DVT/PE   Shortness of Breath Associated symptoms: chest pain and headaches   Associated symptoms: no abdominal pain, no cough, no diaphoresis, no fever, no neck pain, no rash, no vomiting and no wheezing   Headache Pain location:  Generalized Quality:  Dull Radiates to:  Does not radiate Onset quality:  Gradual Timing:  Constant Progression:  Unchanged Chronicity:  Recurrent Relieved by:  Nothing Worsened by:  Light and sound Associated symptoms: fatigue   Associated symptoms: no abdominal pain, no back pain, no blurred vision, no congestion, no cough, no diarrhea, no dizziness, no fever, no loss of balance, no nausea, no near-syncope, no neck pain, no neck stiffness, no numbness, no paresthesias, no tingling, no visual change, no vomiting and no weakness        Home Medications Prior to Admission medications    Medication Sig Start Date End Date Taking? Authorizing Provider  cyclobenzaprine (FLEXERIL) 10 MG tablet Take 10 mg by mouth every evening.  09/17/18   [provider]  MAGNESIUM PO Take 6 tablets by mouth daily.    [provider]  metoprolol succinate (TOPROL-XL) 25 MG 24 hr tablet Take 1 tablet (25 mg total) by mouth daily. 09/04/23   Sherie Don, NP  Multiple Vitamin (MULTIVITAMIN WITH MINERALS) TABS tablet Take 1 tablet by mouth every evening.    [provider]  Naltrexone HCl, Pain, 4.5 MG CAPS Take 4.5 mg by mouth daily.    [provider]  Omega-3 Fatty Acids (FISH OIL) 1000 MG CAPS Take 3 capsules by mouth daily.    [provider]  oxyCODONE-acetaminophen (PERCOCET/ROXICET) 5-325 MG tablet Take 1 tablet by mouth every 6 (six) hours as needed for moderate pain (fribromylagia).    [provider]  polyethylene glycol (MIRALAX / GLYCOLAX) 17 g packet Take 17 g by mouth daily as needed for mild constipation. 07/04/20   Maczis, Elmer Sow, PA-C  Probiotic Product (PROBIOTIC DAILY PO) Take 1 tablet by mouth every evening.     [provider]  sertraline (ZOLOFT) 50 MG tablet Take 50 mg by mouth daily. 05/30/20   [provider]  tirzepatide Greggory Keen) 7.5 MG/0.5ML Pen Inject 7.5 mg into the skin once a week.    [provider]  traMADol (ULTRAM) 50 MG tablet Take 1 tablet (50 mg total) by mouth every 6 (six) hours as needed for severe pain.  07/04/20   Maczis, Elmer Sow, PA-C      Allergies    Erythromycin, Latex, Zithromax [azithromycin], Augmentin [amoxicillin-pot clavulanate], Dilaudid [hydromorphone], Nitrofurantoin monohyd macro, and Penicillins    Review of Systems   Review of Systems  Constitutional:  Positive for fatigue. Negative for chills, diaphoresis and fever.  HENT:  Negative for congestion.   Eyes:  Negative for blurred vision and visual disturbance.  Respiratory:  Positive for chest tightness  and shortness of breath. Negative for cough and wheezing.   Cardiovascular:  Positive for chest pain and palpitations. Negative for near-syncope.  Gastrointestinal:  Negative for abdominal pain, diarrhea, nausea and vomiting.  Genitourinary:  Negative for dysuria and flank pain.  Musculoskeletal:  Negative for back pain, neck pain and neck stiffness.  Skin:  Negative for rash and wound.  Neurological:  Positive for light-headedness and headaches. Negative for dizziness, syncope, weakness, numbness, paresthesias and loss of balance.  Psychiatric/Behavioral:  Negative for agitation and confusion.   All other systems reviewed and are negative.   Physical Exam Updated Vital Signs BP 111/83 (BP Location: Right Arm)   Pulse 87   Temp 98.2 F (36.8 C)   Resp 15   LMP 02/06/2020   SpO2 100%  Physical Exam Vitals and nursing note reviewed.  Constitutional:      General: She is not in acute distress.    Appearance: She is well-developed. She is not ill-appearing, toxic-appearing or diaphoretic.  HENT:     Head: Normocephalic and atraumatic.     Nose: No congestion or rhinorrhea.     Mouth/Throat:     Mouth: Mucous membranes are dry.     Pharynx: No oropharyngeal exudate or posterior oropharyngeal erythema.  Eyes:     Extraocular Movements: Extraocular movements intact.     Conjunctiva/sclera: Conjunctivae normal.     Pupils: Pupils are equal, round, and reactive to light.  Cardiovascular:     Rate and Rhythm: Normal rate and regular rhythm.     Heart sounds: No murmur heard. Pulmonary:     Effort: Pulmonary effort is normal. No respiratory distress.     Breath sounds: Normal breath sounds. No wheezing, rhonchi or rales.  Chest:     Chest wall: Tenderness present.  Abdominal:     General: Abdomen is flat.     Palpations: Abdomen is soft.     Tenderness: There is no abdominal tenderness. There is no right CVA tenderness, left CVA tenderness, guarding or rebound.  Musculoskeletal:         General: No swelling or tenderness.     Cervical back: Neck supple. No tenderness.     Right lower leg: No edema.     Left lower leg: No edema.  Skin:    General: Skin is warm and dry.     Capillary Refill: Capillary refill takes less than 2 seconds.     Findings: No erythema or rash.  Neurological:     General: No focal deficit present.     Mental Status: She is alert.     Cranial Nerves: No dysarthria or facial asymmetry.     Sensory: No sensory deficit.     Motor: No weakness.  Psychiatric:        Mood and Affect: Mood normal.     ED Results / Procedures / Treatments   Labs (all labs ordered are listed, but only abnormal results are displayed) Labs Reviewed  CBC WITH DIFFERENTIAL/PLATELET  COMPREHENSIVE METABOLIC PANEL  TSH  MAGNESIUM  D-DIMER,  QUANTITATIVE  TROPONIN I (HIGH SENSITIVITY)  TROPONIN I (HIGH SENSITIVITY)    EKG EKG Interpretation Date/Time:  Wednesday September 23 2023 10:16:26 EDT Ventricular Rate:  81 PR Interval:  154 QRS Duration:  72 QT Interval:  356 QTC Calculation: 413 R Axis:   62  Text Interpretation: Normal sinus rhythm Normal ECG When compared with ECG of 04-Sep-2023 14:37, PREVIOUS ECG IS PRESENT when compared to prior, similar appearance. No STEMI Confirmed by Theda Belfast ([id number]) on 09/23/2023 10:24:49 AM  Radiology DG Chest 2 View  Result Date: 09/23/2023 CLINICAL DATA:  Chest pain, shortness of breath EXAM: CHEST - 2 VIEW COMPARISON:  10/04/2019 FINDINGS: Cardiac and mediastinal contours are within normal limits. No focal pulmonary opacity. No pleural effusion or pneumothorax. No acute osseous abnormality. IMPRESSION: No acute cardiopulmonary process. Electronically Signed   By: Wiliam Ke M.D.   On: 09/23/2023 15:01    Procedures Procedures    Medications Ordered in ED Medications  prochlorperazine (COMPAZINE) injection 10 mg (10 mg Intravenous Given 09/23/23 1200)  diphenhydrAMINE (BENADRYL) injection 50 mg (50 mg  Intravenous Given 09/23/23 1200)  sodium chloride 0.9 % bolus 1,000 mL (0 mLs Intravenous Stopped 09/23/23 1358)    ED Course/ Medical Decision Making/ A&P                                 Medical Decision Making Amount and/or Complexity of Data Reviewed Labs: ordered. Radiology: ordered.  Risk Prescription drug management.    KALEA WHITIS is a 42 y.o. female with a past medical history significant for palpitations, previous cholecystectomy, previous hysterectomy, anxiety, asthma, and for myalgia who presents with worsening symptoms of palpitations, tachycardia, new chest discomfort, headache, and lightheadedness at times.  According to patient, for the last few weeks she has had intermittent shortness of breath.  She said that her heart rate has been up and down at times.  She had her metoprolol decreased from 50 to 25 mg daily when she was having soft pressures and with orthostasis but now is having more episodes of palpitations and tachycardia at home.  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  She is having chest pain before the stress full episodes took place.  She reports it was a pressure and tightness in her chest that was pleuritic.  She reports that she had some shortness of breath and cannot take a deep breath.  Denies history of blood clots and denies any recent leg pain or leg swelling.  Denies recent travel or flights.  Denies trauma.  Reports with all that she started having headache and is now having moderate headache.  She has not had this in several years.  Denies any fevers, chills, congestion, cough, nausea, vomiting, constipation, diarrhea, or urinary changes.  Denies rashes.  Denies neck pain or neck stiffness.  Denies any neurologic complaints.  Denies head trauma.  Reports the headache is moderate to severe.  On exam, lungs clear.  Chest slightly tender to palpation but she reports it did not reproduce her  initial discomfort.  No murmur on my exam.  Abdomen nontender.  No focal neurologic deficits initially.  Pupils symmetric and reactive with normal extract movements.  Back and flanks nontender.  Legs nontender and nonedematous.  Good pulses in extremities.  EKG does not show STEMI.  No tachycardia seen on initial EKG.  We will give a  headache cocktail to help with her headache.  We will get screening workup to look for concerning etiologies of different chest pain including getting troponins, chest x-ray, and will get a D-dimer given the reported tachycardia and pleuritic discomfort.  Patient thinks this is related to some of the medication change she has had recently.  Anticipate reassessment after workup to determine disposition.  3:53 PM Workup returned overall reassuring.  Troponin negative x 2 and dimer negative.  Chest x-ray reassuring.  Labs otherwise all reassuring.  TSH and magnesium normal.  We went over all the findings together and agree with patient stability and ability to be discharged home.  I suspect it may relate some the recent medication changes and patient agrees.  She thinks the stress of the morning also escalate her symptoms.  As she is feeling better, we feel she is safe for discharge home.  She will call her cardiology team to discuss medication changes and will rest and stay hydrated.  She is feeling better overall.  Patient discharged in good condition for outpatient follow-up.         Final Clinical Impression(s) / ED Diagnoses Final diagnoses:  Atypical chest pain  Palpitations  Bad headache    Rx / DC Orders ED Discharge Orders     None      Clinical Impression: 1. Atypical chest pain   2. Palpitations   3. Bad headache     Disposition: Discharge  Condition: Good  I have discussed the results, Dx and Tx plan with the pt(& family if present). He/she/they expressed understanding and agree(s) with the plan. Discharge instructions discussed  at great length. Strict return precautions discussed and pt &/or family have verbalized understanding of the instructions. No further questions at time of discharge.    New Prescriptions   No medications on file    Follow Up: Soundra Pilon, FNP [phone] [address] [place] [address] [phone]     your cardiologist        Oran Dillenburg, Canary Brim, MD 09/23/23 [phone]

## 2023-09-24 ENCOUNTER — Encounter: Payer: Self-pay | Admitting: Cardiology

## 2023-09-24 ENCOUNTER — Ambulatory Visit: Payer: 59 | Attending: Cardiology | Admitting: Cardiology

## 2023-09-24 ENCOUNTER — Telehealth: Payer: Self-pay | Admitting: Internal Medicine

## 2023-09-24 VITALS — BP 100/70 | HR 103 | Ht 64.0 in | Wt 151.5 lb

## 2023-09-24 DIAGNOSIS — G901 Familial dysautonomia [Riley-Day]: Secondary | ICD-10-CM | POA: Diagnosis not present

## 2023-09-24 DIAGNOSIS — R002 Palpitations: Secondary | ICD-10-CM

## 2023-09-24 MED ORDER — PROPRANOLOL HCL 20 MG PO TABS: 20.0000 mg | ORAL_TABLET | Freq: Every day | ORAL | 3 refills | Status: DC

## 2023-09-24 NOTE — Progress Notes (Signed)
Cardiology Office Note Date:  09/24/2023  Patient ID:  Kim, Kennedy [birth year], MRN [id number] PCP:  Soundra Pilon, FNP  Cardiologist:  None Electrophysiologist: Lewayne Bunting, MD    Chief Complaint: ER follow-uppalpitations  History of Present Illness: Kim Kennedy is a 42 y.o. female with PMH notable for sinus tachycardia, palpitations, hypothyroid; seen today for Lewayne Bunting, MD for acute visit due to palpitations.   She last saw PA Tillery 05/2022, palpitations reasonably well-controlled on toprol 50mg  daily. She went to ER 9/19 for hypotension and dizziness. Labs nl, BP borderline, so toprol stopped. After this she began  having more palpitations and so cut her previous 50mg  toprol tabs into 1/4 and took one everyday. She saw NP Woody in follow-up, toprol again stopped and ivabradine was started. She sent mychart message to clinic afterwards that she was feeling shaky, hot, hypotensive, and HR was racing, so she stopped ivabradine and restarted 1/2 of a 50mg  toprol.  I saw her earlier this month after particualrly bad day of palpitations. 25mg  toprol daily continued. Recommended lifestyle changes.   She presented to ER yesterday with chest pain, SOB, and headache. Trops neg, labs nl, EKG showed sinus rhtyhm, CXR nl.   On follow-up today, she has had a particularly stressful week at work. She feels worse since starting 25mg  toprol as opposed to cutting her 50mg  toprol in half.  Has been maintaining good fluid intake, adding salty snacks. She has intermittently been using abd and thigh binder. She stopped mounjaro recently in hopes that it would improver her palpitation symptoms. Her BP continues to be low at home, usually 110s/70s.  She is currently a little dizzy.    Currently, she denies chest pain, chest pressure, SOB.   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   IBS (irritable bowel syndrome)    diet controlled   Inactive TB 2011   chest xray neg   LGSIL of cervix of undetermined significance    Nausea    patient denies   Osteoarthritis    Palpitations 08/30/2014   Pneumonia    Symptomatic cholelithiasis 07/02/2020   Tachycardia    controlled with lopressor, no problems   Third trimester pregnancy at less than 36 weeks 11/22/2014    Past Surgical History:  Procedure Laterality Date   ABDOMINAL HYSTERECTOMY     CESAREAN SECTION N/A 12/26/2014   Procedure: Primary CESAREAN SECTION;  Surgeon: Lenoard Aden, MD;  Location: [facility];  Service: Obstetrics;  Laterality: N/A;  EDD: 12/23/14   CHOLECYSTECTOMY N/A 07/03/2020   Procedure: LAPAROSCOPIC CHOLECYSTECTOMY WITH LIVER BIOPSY;  Surgeon: Romie Levee, MD;  Location: [facility];  Service: General;  Laterality: N/A;   COLPOSCOPY W/ BIOPSY / CURETTAGE     DILATION AND EVACUATION N/A 12/26/2014   Procedure: DILATATION AND  EVACUATION with Bakri Balloon Placement;  Surgeon: Lenoard Aden, MD;  Location: [facility];  Service: Gynecology;  Laterality: N/A;   LAPAROSCOPIC LYSIS OF ADHESIONS N/A 02/27/2014   Procedure: LAPAROSCOPIC LYSIS OF ADHESIONS;  Surgeon: Lenoard Aden, MD;  Location: [facility];  Service: Gynecology;  Laterality: N/A;   LAPAROSCOPY N/A 02/27/2014   Procedure: LAPAROSCOPY DIAGNOSTIC;  Surgeon: Lenoard Aden, MD;  Location: [facility];  Service: Gynecology;  Laterality: N/A;   MANDIBLE FRACTURE SURGERY  03/2001   x 7 jaw surgeries. TMJ   NASAL  SEPTUM SURGERY  03/2001   ROBOTIC ASSISTED LAPAROSCOPIC HYSTERECTOMY AND SALPINGECTOMY Bilateral 04/19/2020   Procedure: XI ROBOTIC ASSISTED LAPAROSCOPIC HYSTERECTOMY AND SALPINGECTOMY lysis of left adnexa to left sigmoid mesentery adhesions, Rogelio Seen culdoplasty.;  Surgeon: Olivia Mackie, MD;  Location: [facility];  Service: Gynecology;  Laterality: Bilateral;   tear duct surgery     at 66 months old - right eye   TONSILLECTOMY AND ADENOIDECTOMY  2000   WISDOM TOOTH EXTRACTION      Current Outpatient Medications  Medication Instructions   cyclobenzaprine (FLEXERIL) 10 mg, Oral, Every evening   MAGNESIUM PO 6 tablets, Oral, Daily   metoprolol succinate (TOPROL-XL) 25 mg, Oral, Daily   Multiple Vitamin (MULTIVITAMIN WITH MINERALS) TABS tablet 1 tablet, Oral, Every evening   Naltrexone HCl (Pain) 4.5 mg, Oral, Daily   Omega-3 Fatty Acids (FISH OIL) 1000 MG CAPS 3 capsules, Oral, Daily   oxyCODONE-acetaminophen (PERCOCET/ROXICET) 5-325 MG tablet 1 tablet, Oral, Every 6 hours PRN   polyethylene glycol (MIRALAX / GLYCOLAX) 17 g, Oral, Daily PRN   Probiotic Product (PROBIOTIC DAILY PO) 1 tablet, Oral, Every evening   sertraline (ZOLOFT) 50 mg, Oral, Daily   tirzepatide (MOUNJARO) 7.5 mg, Weekly   traMADol (ULTRAM) 50 mg, Oral, Every 6 hours PRN    Social History:  The patient  reports that she has never smoked. She has never used smokeless tobacco. She reports current alcohol use. She reports that she does not use drugs.   Family History:  The patient's family history includes Anxiety disorder in her mother; Diabetes in her paternal uncle; Fibromyalgia in her mother; Heart attack in her maternal grandfather, maternal grandmother, paternal grandfather, and paternal grandmother; Hepatitis C in her mother; Hyperlipidemia in her father; Narcolepsy in her father; Osteoporosis in her mother and paternal grandmother; Thrombocytopenia in her mother.  ROS:  Please see the history of  present illness. All other systems are reviewed and otherwise negative.   PHYSICAL EXAM:  VS:  Ht 5\' 4"  (1.626 m)   Wt 151 lb 8 oz (68.7 kg)   LMP 02/06/2020   SpO2 98%   BMI 26.00 kg/m  BMI: Body mass index is 26 kg/m.  Orthostatic VS for the past 24 hrs (Last 3 readings):  BP- Lying Pulse- Lying BP- Sitting Pulse- Sitting BP- Standing at 0 minutes Pulse- Standing at 0 minutes BP- Standing at 3 minutes Pulse- Standing at 3 minutes  09/24/23 1419 110/78 97 110/80 110 115/81 110 117/82 117    GEN- The patient is well appearing, alert and oriented x 3 today.   Lungs- Clear to ausculation bilaterally, normal work of breathing.  Heart- Regular, tachycardic rate and rhythm, no murmurs, rubs or gallops Extremities- No peripheral edema, warm, dry   EKG is ordered. Personal review of EKG from today shows:    EKG Interpretation Date/Time:  Thursday September 24 2023 14:15:50 EDT Ventricular Rate:  103 PR Interval:  136 QRS Duration:  76 QT Interval:  344 QTC Calculation: 450 R Axis:   53  Text Interpretation: Sinus tachycardia When compared with ECG of 23-Sep-2023 10:16, No significant change was found Confirmed by Sherie Don [phone]) on 09/24/2023 2:16:53 PM    Recent Labs: 09/23/2023: ALT 31; BUN 19; Creatinine, Ser 0.68; Hemoglobin 14.8; Magnesium 2.3; Platelets 291; Potassium 4.0; Sodium 141; TSH 1.866  No results found for requested labs within last 365 days.   Estimated Creatinine Clearance: 87.2 mL/min (by C-G formula based on SCr of 0.68 mg/dL).   Wt Readings from Last 3 Encounters:  09/24/23 151 lb 8 oz (68.7 kg)  09/04/23 150 lb (68 kg)  08/14/23 153 lb 9.6 oz (69.7 kg)     Additional studies reviewed include: Previous EP, cardiology notes.     ASSESSMENT AND PLAN:  #) Dysautonomia #) palpitations #) sinus tachycardia Thyroid, electrolytes normal at ER yesterday EKG shows sinus tach Discussed medications options, patient requested to switch from metop to  propanolol as we discussed at our previous appointment.  Will stop toprol 25mg , and start 20mg  propranolol daily Continue salt and water repletion, salt supplements are best used with adjunctive sugar Continue abdominal and thigh binder     Current medicines are reviewed at length with the patient today.   The patient has concerns regarding her medicines.  The following changes were made today:  STOP toprol START 20mg  propranolol   Labs/ tests ordered today include:  Orders Placed This Encounter  Procedures   EKG 12-Lead     Disposition: Follow up with Dr. Ladona Ridgel or EP APP  2 months    Signed, Sherie Don, NP  09/24/23  2:16 PM  Electrophysiology [facility]

## 2023-09-24 NOTE — Telephone Encounter (Signed)
Patient seen in office today- medication list adjusted, and patient will have STD forms dropped off. Will give to NP once provided.

## 2023-09-24 NOTE — Patient Instructions (Addendum)
Medication Instructions:  STOP Metoprolol  START Propanolol 20 mg daily   *If you need a refill on your cardiac medications before your next appointment, please call your pharmacy*   Follow-Up: At [facility], you and your health needs are our priority.  As part of our continuing mission to provide you with exceptional heart care, we have created designated Provider Care Teams.  These Care Teams include your primary Cardiologist (physician) and Advanced Practice Providers (APPs -  Physician Assistants and Nurse Practitioners) who all work together to provide you with the care you need, when you need it.  We recommend signing up for the patient portal called "MyChart".  Sign up information is provided on this After Visit Summary.  MyChart is used to connect with patients for Virtual Visits (Telemedicine).  Patients are able to view lab/test results, encounter notes, upcoming appointments, etc.  Non-urgent messages can be sent to your provider as well.   To learn more about what you can do with MyChart, go to [email or web address].    Your next appointment:   Keep scheduled appointment.

## 2023-09-24 NOTE — Telephone Encounter (Signed)
Patient stated she was in ER yesterday and wants to know if she can change her medication to propanolol.  Patient stated she needs assistance with short term disability.  Patient has appointment this afternoon.

## 2023-09-29 ENCOUNTER — Telehealth: Payer: Self-pay | Admitting: Cardiology

## 2023-09-29 NOTE — Telephone Encounter (Signed)
Pt c/o BP issue: STAT if pt c/o blurred vision, one-sided weakness or slurred speech  1. What are your last 5 BP readings?  12:41 today 105/83 10:30 today 109/81(just gotten out of bed) 6:20 yesterday evening 110/77 2:45 yesterday afternoon 117/83 11:30 yesterday morning 117/88  9:45 yesterday morning  91/72 , HR   2. Are you having any other symptoms (ex. Dizziness, headache, blurred vision, passed out)? Constant low grade headache, SOB, dizziness, droggyness. Nothing has changed in the last couple weeks in the way she feels  3. What is your BP issue? Too low  Patient states she is taking Propanolol, not sure if the dosage needs to be changed.  STAT if HR is under 50 or over 120 (normal HR is 60-100 beats per minute)  What is your heart rate?  Yesterday 4:32 pm HR 135 Yesterday 6:50 pm HR 108        Yesterday 9:04 pm HR 78           2)Do you have any other symptoms? See above

## 2023-09-29 NOTE — Telephone Encounter (Signed)
Called patient- she states she has had about 5 doses of the Propanolol 20 mg dosage. She states she's not sure if she needs to increase this or not. She states her blood pressure readings are below- lowest in the morning. She states her HR is causing her concerns, when she goes to do anything or move around at all her HR will jump up. She states it was just 119 and she just walked up her stairs calmly. Advised that I would send a message over to [place] to review, however she is okay to stick it out with the 20 mg a few more days, but not sure she is seeing any improvement. With blood pressure numbers not sure increasing this would be an option, but I would check and see.   Thanks!

## 2023-09-29 NOTE — Telephone Encounter (Signed)
Called patient, advised of message from NP.  Patient verbalized understanding. Scheduled to come and see Dr.Klein 12/05 for POTS discussion consult.

## 2023-10-02 ENCOUNTER — Telehealth: Payer: Self-pay | Admitting: Cardiology

## 2023-10-02 NOTE — Telephone Encounter (Signed)
Patient is calling to confirm if we have received her [employer] short term disability forms. Please advise.

## 2023-10-02 NOTE — Telephone Encounter (Signed)
Called patient and informed her that we have not received disability forms. Patient states that she will have them faxed again.

## 2023-10-04 ENCOUNTER — Telehealth: Payer: Self-pay | Admitting: Physician Assistant

## 2023-10-04 NOTE — Telephone Encounter (Signed)
Pt called because she does not feel that well.   She feels her volume status is good. Feels she eats enough salt.   Wears compression hose and abd binder.   Has been struggling because HR is in 120s resting and up to 130s when up and around.  Ok to take an extra propranolol 10-20 mg prn, let us know how she feels.   Theodore Demark, PA-C 10/04/2023 3:11 PM

## 2023-10-06 ENCOUNTER — Telehealth: Payer: Self-pay | Admitting: Cardiology

## 2023-10-06 DIAGNOSIS — Z0279 Encounter for issue of other medical certificate: Secondary | ICD-10-CM

## 2023-10-06 NOTE — Telephone Encounter (Signed)
Pt dropped off [employer] STD/FMLA forms to be completed. Billing sheet faxed, pt paid $29 cash. Incomplete forms scanned into documents and forms placed in Sherie Don, NP's nurse box.

## 2023-10-07 NOTE — Telephone Encounter (Signed)
Forms received and given to NP to fill out. Will notify when completed.  Thanks!

## 2023-10-08 NOTE — Telephone Encounter (Signed)
Pt called back and notified that Suzann received the FMLA paperwork and that she should hear something by next and that this office would notify her

## 2023-10-08 NOTE — Telephone Encounter (Signed)
Patient called to get an update on the status of her FMLA paperwork.

## 2023-10-14 ENCOUNTER — Telehealth: Payer: Self-pay | Admitting: Emergency Medicine

## 2023-10-14 NOTE — Telephone Encounter (Signed)
FMLA paperwork received  Faxed to [phone]  Pt called to let her know status and she reports that she may be at office morning of 10/15/23 to pick up [facility] paperwork

## 2023-10-14 NOTE — Telephone Encounter (Signed)
Call from call center - Pt is requesting status on FMLA paperwork.  Reports that she is "on day 19 of leave and my boss is breathing down my neck"   This RN apologized for delay and reported that I would fax FMLA paperwork to [facility] today

## 2023-10-15 NOTE — Telephone Encounter (Signed)
Received completed forms from MD, scanned into chart, and patient will come pick up today. Forms also faxed to [employer].

## 2023-10-29 ENCOUNTER — Telehealth: Payer: Self-pay | Admitting: Internal Medicine

## 2023-10-29 ENCOUNTER — Encounter: Payer: Self-pay | Admitting: Internal Medicine

## 2023-10-29 ENCOUNTER — Ambulatory Visit: Payer: 59 | Attending: Internal Medicine | Admitting: Internal Medicine

## 2023-10-29 ENCOUNTER — Ambulatory Visit: Payer: 59

## 2023-10-29 VITALS — Ht 64.0 in | Wt 162.6 lb

## 2023-10-29 DIAGNOSIS — R002 Palpitations: Secondary | ICD-10-CM

## 2023-10-29 DIAGNOSIS — G901 Familial dysautonomia [Riley-Day]: Secondary | ICD-10-CM | POA: Diagnosis not present

## 2023-10-29 MED ORDER — PROPRANOLOL HCL 20 MG PO TABS: 20.0000 mg | ORAL_TABLET | Freq: Three times a day (TID) | ORAL | 0 refills | Status: DC

## 2023-10-29 NOTE — Progress Notes (Signed)
ELECTROPHYSIOLOGY CONSULT NOTE  Patient ID: Kim Kennedy, MRN: [id number], DOB/AGE: 1981-04-12 42 y.o. Admit date: (Not on file) Date of Consult: 10/29/2023  Primary Physician: Soundra Pilon, FNP Primary Cardiologist: SR     Kim Kennedy is a 42 y.o. female who is being seen today for the evaluation of palpitations at the request of SR.    HPI Kim Kennedy is a 42 y.o. female referred for evaluation of tachypalpitations.  She has a longstanding diagnosis of fibromyalgia  Initially diagnosed 20 years ago with "rhythmic tachycardia "underwent extensive evaluation with no specific diagnosis.  Was treated with metoprolol which has taken largely since with good effect until the last year or so.  Around that time she started taking Mounjaro and a compounded form.  Started noticing problems with brain fog exercise intolerance limiting fatigue worsening fitness.  She ended up with hypotension prompting the discontinuation of her beta-blocker and an episode of syncope presumably vasovagal.  With the discontinuation of the beta-blocker she had more problems with tachycardia she was started on ivabradine which she took once a day which made her symptoms worse and then got started on propranolol 20 mg initially at night but she found that her symptoms were worsening in the afternoon so they moved her daily dose to the morning.  This has improved symptoms considerably but by the middle of the afternoon her symptoms began to recur.  Moreover, in the afternoon she notes that her blood pressure is up along with her heart rate in the 1 30-140 range.  Raynaud's.  Symptoms are aggravated by meals--particularly particularly hot, carbohydrates and large meals.  Tachycardia and fatigue.  She showers without lightheadedness but she does have palpitations and fatigue.  Some shortness of breath.  Finds cold more difficulty than heat.  Does not have hypermobility.  Recently identified with a high  ANA.  She has increased her sodium a little bit (330 mg extra a day) with good volume intake.  10/30 she developed an episode of chest pain that was intense and localized.  She ended up in the emergency room her blood pressure was low and she was given IV fluids.  D-dimer and troponins were negative.  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  She was put out on short-term disability by SR about 4 weeks ago.  She is anxious both about returning and about not returning.  History of anxiety and depression for which she takes Zoloft  Recently for her fibromyalgia she was started on naltrexone and Flexeril with significant reduction in pain.  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   Cramping affecting pregnancy, antepartum    Depression    hx - no meds    Dysmenorrhea 04/19/2020   Dysrhythmia    Hx tachycardia - controlled with lopressor, no problems   Fibromyalgia 2007   no meds   GBS (group B streptococcus) UTI complicating pregnancy    History of asthma  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   IBS (irritable bowel syndrome)    diet controlled   Inactive TB 2011   chest xray neg   LGSIL of cervix of undetermined significance    Nausea    patient denies   Osteoarthritis    Palpitations 08/30/2014   Pneumonia    Symptomatic cholelithiasis 07/02/2020   Tachycardia    controlled with lopressor, no problems   Third trimester pregnancy at less than 36 weeks 11/22/2014      Surgical History:  Past Surgical History:  Procedure Laterality Date   ABDOMINAL HYSTERECTOMY     CESAREAN SECTION N/A 12/26/2014   Procedure: Primary  CESAREAN SECTION;  Surgeon: Lenoard Aden, MD;  Location: [facility];  Service: Obstetrics;  Laterality: N/A;  EDD: 12/23/14   CHOLECYSTECTOMY N/A 07/03/2020   Procedure: LAPAROSCOPIC CHOLECYSTECTOMY WITH LIVER BIOPSY;  Surgeon: Romie Levee, MD;  Location: [facility];  Service: General;  Laterality: N/A;   COLPOSCOPY W/ BIOPSY / CURETTAGE     DILATION AND EVACUATION N/A 12/26/2014   Procedure: DILATATION AND EVACUATION with Bakri Balloon Placement;  Surgeon: Lenoard Aden, MD;  Location: [facility];  Service: Gynecology;  Laterality: N/A;   LAPAROSCOPIC LYSIS OF ADHESIONS N/A 02/27/2014   Procedure: LAPAROSCOPIC LYSIS OF ADHESIONS;  Surgeon: Lenoard Aden, MD;  Location: [facility];  Service: Gynecology;  Laterality: N/A;   LAPAROSCOPY N/A 02/27/2014   Procedure: LAPAROSCOPY DIAGNOSTIC;  Surgeon: Lenoard Aden, MD;  Location: [facility];  Service: Gynecology;  Laterality: N/A;   MANDIBLE FRACTURE SURGERY  03/2001   x 7 jaw surgeries. TMJ   NASAL SEPTUM SURGERY  03/2001   ROBOTIC ASSISTED LAPAROSCOPIC HYSTERECTOMY AND SALPINGECTOMY Bilateral 04/19/2020   Procedure: XI ROBOTIC ASSISTED LAPAROSCOPIC HYSTERECTOMY AND SALPINGECTOMY lysis of left adnexa to left sigmoid mesentery adhesions, Rogelio Seen culdoplasty.;  Surgeon: Olivia Mackie, MD;  [facility];  Service: Gynecology;  Laterality: Bilateral;   tear duct surgery     at 56 months old - right eye   TONSILLECTOMY AND ADENOIDECTOMY  2000   WISDOM TOOTH EXTRACTION       Home Meds: Current Meds  Medication Sig   cyclobenzaprine (FLEXERIL) 10 MG tablet Take 10 mg by mouth every evening.    MAGNESIUM PO Take 6 tablets by mouth daily.   Multiple Vitamin (MULTIVITAMIN WITH MINERALS) TABS tablet Take 1 tablet by mouth every evening.   Naltrexone HCl, Pain, 4.5 MG CAPS Take 4.5 mg by mouth daily.   Omega-3 Fatty Acids (FISH OIL) 1000 MG CAPS Take 3 capsules by mouth daily.   oxyCODONE-acetaminophen (PERCOCET/ROXICET) 5-325 MG tablet Take 1  tablet by mouth every 6 (six) hours as needed for moderate pain (fribromylagia).   polyethylene glycol (MIRALAX / GLYCOLAX) 17 g packet Take 17 g by mouth daily as needed for mild constipation.   Probiotic Product (PROBIOTIC DAILY PO) Take 1 tablet by mouth every evening.    sertraline (ZOLOFT) 50 MG tablet Take 50 mg by mouth daily.   traMADol (ULTRAM) 50 MG tablet Take 1 tablet (50 mg total) by mouth every 6 (six) hours as needed for severe pain.   [DISCONTINUED] propranolol (INDERAL) 20 MG tablet Take 1 tablet (20 mg total) by mouth daily.    Allergies:  Allergies  Allergen Reactions   Erythromycin Anaphylaxis   Latex Swelling and Rash    Swelling at contact site   Zithromax [Azithromycin] Anaphylaxis and Other (See Comments)    Tightness in chest   Augmentin [  Amoxicillin-Pot Clavulanate] Diarrhea and Nausea And Vomiting   Dilaudid [Hydromorphone]    Nitrofurantoin Monohyd Macro Hives   Penicillins Diarrhea and Nausea And Vomiting    Social History   Socioeconomic History   Marital status: Married    Spouse name: Not on file   Number of children: Not on file   Years of education: Not on file   Highest education level: Not on file  Occupational History   Not on file  Tobacco Use   Smoking status: Never   Smokeless tobacco: Never  Vaping Use   Vaping status: Never Used  Substance and Sexual Activity   Alcohol use: Yes    Comment: 1 glass of wine/week but none with preganacy   Drug use: No   Sexual activity: Yes    Partners: Male    Birth control/protection: None  Other Topics Concern   Not on file  Social History Narrative   Not on file   Social Determinants of Health   Financial Resource Strain: Not on file  Food Insecurity: Not on file  Transportation Needs: Not on file  Physical Activity: Not on file  Stress: Not on file  Social Connections: Not on file  Intimate Partner Violence: Not on file     Family History  Problem Relation Age of Onset    Fibromyalgia Mother    Thrombocytopenia Mother        had spleen removed   Hepatitis C Mother    Osteoporosis Mother    Anxiety disorder Mother    Hyperlipidemia Father    Narcolepsy Father    Heart attack Maternal Grandmother    Heart attack Maternal Grandfather    Heart attack Paternal Grandmother    Osteoporosis Paternal Grandmother    Heart attack Paternal Grandfather    Diabetes Paternal Uncle        DM     ROS:  Please see the history of present illness.     All other systems reviewed and negative.    Physical Exam: Height 5\' 4"  (1.626 m), weight 162 lb 9.6 oz (73.8 kg), last menstrual period 02/06/2020, SpO2 100%, unknown if currently breastfeeding. General: Well developed, well nourished female in no acute distress. Head: Normocephalic, atraumatic, sclera non-icteric, no xanthomas, nares are without discharge. EENT: normal  Lymph Nodes:  none Neck: Negative for carotid bruits. JVD not elevated. Back:without scoliosis kyphosis Lungs: Clear bilaterally to auscultation without wheezes, rales, or rhonchi. Breathing is unlabored. Heart: RRR with S1 S2. No murmur . No rubs, or gallops appreciated. Abdomen: Soft, non-tender, non-distended with normoactive bowel sounds. No hepatomegaly. No rebound/guarding. No obvious abdominal masses. Msk:  Strength and tone appear normal for age. Extremities: No clubbing or cyanosis. No + edema.  Distal pedal pulses are 2+ and equal bilaterally. Skin: Warm and Dry Neuro: Alert and oriented X 3. CN III-XII intact Grossly normal sensory and motor function . Psych:  Responds to questions appropriately with a normal affect. Intermittently tearful        EKG: Sinus at 92 Interval 16/08/37   Assessment and Plan:  Dysautonomia  Fibromyalgia  Fatigue  Anxiety/depression  Patient has a longstanding history of tachypalpitations which are well-controlled by metoprolol until over the last year.  This is concurrent with the use of Mounjaro.   She now has significantly increased persistent effort intolerance with tachypalpitations and concomitant hypertension, some orthostatic intolerance in the setting of fibromyalgia, fatigue and history of depression.  She has had a reasonable response to the propranolol, and  we will increase his frequency from daily to 2-3 times a day and thereafter consider using a long-acting formulation.  She is fluid replete, augmented salt is a challenge because of both edema which she feels in her hands as well as elevated blood pressure although the latter is something that we could certainly be permissive about in the short-term.  We discussed extensively the issues of dysautonomia, the physiology of orthstasis and positional stress.  We discussed the role of salt and water repletion, the importance of exercise, often needing to be started in the recumbent position, and the awareness of triggers and the role of ambient heat and dehydration   she will look into getting a recumbent bike and we discussed the CHOP protocol  Will look at her left ventricular function to make sure there is no underlying structural heart disease.  Use a Zio patch to record heart rate excursions  We also discussed about soul care, i.e. attending to the breathing and the losses that have been hers over the years  Time 11;42--1310; 17;52-1809            Sherryl Manges

## 2023-10-29 NOTE — Patient Instructions (Signed)
Medication Instructions:  Your physician has recommended you make the following change in your medication:   ** Increase Inderal 20mg  to 1 tablet by mouth 3 times daily.  *If you need a refill on your cardiac medications before your next appointment, please call your pharmacy*   Lab Work: None ordered.  If you have labs (blood work) drawn today and your tests are completely normal, you will receive your results only by: MyChart Message (if you have MyChart) OR A paper copy in the mail If you have any lab test that is abnormal or we need to change your treatment, we will call you to review the results.   Testing/Procedures: Kim Kennedy- Long Term Monitor Instructions  Your physician has requested you wear a ZIO patch monitor for 14 days.  This is a single patch monitor. [employer] supplies one patch monitor per enrollment. Additional stickers are not available. Please do not apply patch if you will be having a Nuclear Stress Test,  Echocardiogram, Cardiac CT, MRI, or Chest Xray during the period you would be wearing the  monitor. The patch cannot be worn during these tests. You cannot remove and re-apply the  ZIO XT patch monitor.  Your ZIO patch monitor will be mailed 3 day USPS to your address on file. It may take 3-5 days  to receive your monitor after you have been enrolled.  Once you have received your monitor, please review the enclosed instructions. Your monitor  has already been registered assigning a specific monitor serial # to you.  Billing and Patient Assistance Program Information  We have supplied [employer] with any of your insurance information on file for billing purposes. [employer] offers a sliding scale Patient Assistance Program for patients that do not have  insurance, or whose insurance does not completely cover the cost of the ZIO monitor.  You must apply for the Patient Assistance Program to qualify for this discounted rate.  To apply, please call [employer] at  [phone], select option 4, select option 2, ask to apply for  Patient Assistance Program. Kim Kennedy will ask your household income, and how many people  are in your household. They will quote your out-of-pocket cost based on that information.  [employer] will also be able to set up a 65-month, interest-free payment plan if needed.  Applying the monitor   Shave hair from upper left chest.  Hold abrader disc by orange tab. Rub abrader in 40 strokes over the upper left chest as  indicated in your monitor instructions.  Clean area with 4 enclosed alcohol pads. Let dry.  Apply patch as indicated in monitor instructions. Patch will be placed under collarbone on left  side of chest with arrow pointing upward.  Rub patch adhesive wings for 2 minutes. Remove white label marked "1". Remove the white  label marked "2". Rub patch adhesive wings for 2 additional minutes.  While looking in a mirror, press and release button in center of patch. A small green light will  flash 3-4 times. This will be your only indicator that the monitor has been turned on.  Do not shower for the first 24 hours. You may shower after the first 24 hours.  Press the button if you feel a symptom. You will hear a small click. Record Date, Time and  Symptom in the Patient Logbook.  When you are ready to remove the patch, follow instructions on the last 2 pages of Patient  Logbook. Stick patch monitor onto the last page of Patient Logbook.  Place Patient Logbook in the blue and white box. Use locking tab on box and tape box closed  securely. The blue and white box has prepaid postage on it. Please place it in the mailbox as  soon as possible. Your physician should have your test results approximately 7 days after the  monitor has been mailed back to [facility].  Call [facility] Customer Care at [phone] if you have questions regarding  your ZIO XT patch monitor. Call them immediately if you see an orange light  blinking on your  monitor.  If your monitor falls off in less than 4 days, contact our Monitor department at [phone].  If your monitor becomes loose or falls off after 4 days call [employer] at [phone] for  suggestions on securing your monitor    Follow-Up: At [facility], you and your health needs are our priority.  As part of our continuing mission to provide you with exceptional heart care, we have created designated Provider Care Teams.  These Care Teams include your primary Cardiologist (physician) and Advanced Practice Providers (APPs -  Physician Assistants and Nurse Practitioners) who all work together to provide you with the care you need, when you need it.  We recommend signing up for the patient portal called "MyChart".  Sign up information is provided on this After Visit Summary.  MyChart is used to connect with patients for Virtual Visits (Telemedicine).  Patients are able to view lab/test results, encounter notes, upcoming appointments, etc.  Non-urgent messages can be sent to your provider as well.   To learn more about what you can do with MyChart, go to [email or web address].    Your next appointment:   Please schedule a telephone visit with Dr Graciela Husbands the week of November 09, 2023

## 2023-10-29 NOTE — Telephone Encounter (Signed)
  Patient Consent for Virtual Visit        Kim Kennedy has provided verbal consent on 10/29/2023 for a virtual visit (video or telephone).   CONSENT FOR VIRTUAL VISIT FOR:  Kim Kennedy  By participating in this virtual visit I agree to the following:  I hereby voluntarily request, consent and authorize [facility] and its employed or contracted physicians, physician assistants, nurse practitioners or other licensed health care professionals (the Practitioner), to provide me with telemedicine health care services (the "Services") as deemed necessary by the treating Practitioner. I acknowledge and consent to receive the Services by the Practitioner via telemedicine. I understand that the telemedicine visit will involve communicating with the Practitioner through live audiovisual communication technology and the disclosure of certain medical information by electronic transmission. I acknowledge that I have been given the opportunity to request an in-person assessment or other available alternative prior to the telemedicine visit and am voluntarily participating in the telemedicine visit.  I understand that I have the right to withhold or withdraw my consent to the use of telemedicine in the course of my care at any time, without affecting my right to future care or treatment, and that the Practitioner or I may terminate the telemedicine visit at any time. I understand that I have the right to inspect all information obtained and/or recorded in the course of the telemedicine visit and may receive copies of available information for a reasonable fee.  I understand that some of the potential risks of receiving the Services via telemedicine include:  Delay or interruption in medical evaluation due to technological equipment failure or disruption; Information transmitted may not be sufficient (e.g. poor resolution of images) to allow for appropriate medical decision making by the  Practitioner; and/or  In rare instances, security protocols could fail, causing a breach of personal health information.  Furthermore, I acknowledge that it is my responsibility to provide information about my medical history, conditions and care that is complete and accurate to the best of my ability. I acknowledge that Practitioner's advice, recommendations, and/or decision may be based on factors not within their control, such as incomplete or inaccurate data provided by me or distortions of diagnostic images or specimens that may result from electronic transmissions. I understand that the practice of medicine is not an exact science and that Practitioner makes no warranties or guarantees regarding treatment outcomes. I acknowledge that a copy of this consent can be made available to me via my patient portal [facility] MyChart), or I can request a printed copy by calling the office of [facility].    I understand that my insurance will be billed for this visit.   I have read or had this consent read to me. I understand the contents of this consent, which adequately explains the benefits and risks of the Services being provided via telemedicine.  I have been provided ample opportunity to ask questions regarding this consent and the Services and have had my questions answered to my satisfaction. I give my informed consent for the services to be provided through the use of telemedicine in my medical care

## 2023-11-04 ENCOUNTER — Telehealth: Payer: Self-pay | Admitting: Internal Medicine

## 2023-11-04 NOTE — Telephone Encounter (Signed)
Returned the call to the patient. She stated that she was ready to go back to work and wants to know if Dr. Graciela Husbands or Luella Cook would write her back for work.   Her husband has been laid off of work. She stated she was due to go back after Christmas.   She stated that she will call [employer] and see what needs to be done on her side.

## 2023-11-04 NOTE — Telephone Encounter (Signed)
Patient called and said that she want to be let off of short-term disability so that she can go back to work. Please call back

## 2023-11-05 NOTE — Telephone Encounter (Signed)
The patient has been made aware and will call [employer] to see what needs to be done   Sherie Don, NP  You; Duke Salvia, MD22 hours ago (3:28 PM)    Happy to have her return to work if she feels as though she can resume full work responsibilities with her palpitation

## 2023-11-06 ENCOUNTER — Telehealth: Payer: Self-pay | Admitting: Internal Medicine

## 2023-11-06 NOTE — Telephone Encounter (Signed)
Patient is calling in reference to the 11/04/23 phone encounter. Patient stated [employer] is needing a letter with a letter head stating the return to work date is 11/16/23. Please advise.

## 2023-11-09 ENCOUNTER — Encounter: Payer: Self-pay | Admitting: Internal Medicine

## 2023-11-09 ENCOUNTER — Ambulatory Visit: Payer: 59 | Attending: Internal Medicine | Admitting: Internal Medicine

## 2023-11-09 DIAGNOSIS — R002 Palpitations: Secondary | ICD-10-CM

## 2023-11-09 DIAGNOSIS — G901 Familial dysautonomia [Riley-Day]: Secondary | ICD-10-CM

## 2023-11-09 MED ORDER — PROPRANOLOL HCL ER 80 MG PO CP24: 80.0000 mg | ORAL_CAPSULE | Freq: Every day | ORAL | 3 refills | Status: DC

## 2023-11-09 NOTE — Progress Notes (Signed)
Electrophysiology TeleHealth Note      Date:  11/09/2023   ID:  Kim Kennedy, DOB 11-26-1980, MRN [id number]  Location: patient's home  Provider location: [address], [place]  Evaluation Performed: Follow-up visit  PCP:  Soundra Pilon, FNP  Cardiologist:    Electrophysiologist:  SK   Chief Complaint:  palpitations  History of Present Illness:   My location is [facility]. The Patients location is their home. Kim Kennedy is a 42 y.o. female who presents via audio conferencing for a telehealth visit today.  Since last being seen in our clinic for palpitations associated with tachycardia previous episode treatment of a beta-blocker and ivabradine but then resumed on propranolol initially daily and now with increasing frequency the patient reports doing better with propranolol 3xdays. Meals still a trigger, esp sweets.   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  The patient denies symptoms of fevers, chills, cough, or new SOB worrisome for COVID 19.    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   IBS (irritable bowel syndrome)    diet controlled   Inactive TB 2011   chest xray neg   LGSIL of cervix of  undetermined significance    Nausea    patient denies   Osteoarthritis    Palpitations 08/30/2014   Pneumonia    Symptomatic cholelithiasis 07/02/2020   Tachycardia    controlled with lopressor, no problems   Third trimester pregnancy at less than 36 weeks 11/22/2014    Past Surgical History:  Procedure Laterality Date   ABDOMINAL HYSTERECTOMY     CESAREAN SECTION N/A 12/26/2014   Procedure: Primary CESAREAN SECTION;  Surgeon: Lenoard Aden, MD;  Location: [facility];  Service: Obstetrics;  Laterality: N/A;  EDD: 12/23/14   CHOLECYSTECTOMY N/A 07/03/2020   Procedure: LAPAROSCOPIC CHOLECYSTECTOMY WITH LIVER BIOPSY;  Surgeon: Romie Levee, MD;  Location: [facility];  Service: General;  Laterality: N/A;   COLPOSCOPY W/ BIOPSY / CURETTAGE     DILATION AND EVACUATION N/A 12/26/2014   Procedure: DILATATION AND EVACUATION with Bakri Balloon Placement;  Surgeon: Lenoard Aden, MD;  Location: [facility];  Service: Gynecology;  Laterality: N/A;   LAPAROSCOPIC LYSIS OF ADHESIONS N/A 02/27/2014   Procedure: LAPAROSCOPIC LYSIS OF ADHESIONS;  Surgeon: Lenoard Aden, MD;  Location: [facility];  Service: Gynecology;  Laterality: N/A;   LAPAROSCOPY N/A 02/27/2014   Procedure: LAPAROSCOPY DIAGNOSTIC;  Surgeon: Lenoard Aden, MD;  Location: [facility];  Service: Gynecology;  Laterality: N/A;   MANDIBLE FRACTURE SURGERY  03/2001   x 7 jaw surgeries. TMJ   NASAL SEPTUM  SURGERY  03/2001   ROBOTIC ASSISTED LAPAROSCOPIC HYSTERECTOMY AND SALPINGECTOMY Bilateral 04/19/2020   Procedure: XI ROBOTIC ASSISTED LAPAROSCOPIC HYSTERECTOMY AND SALPINGECTOMY lysis of left adnexa to left sigmoid mesentery adhesions, Rogelio Seen culdoplasty.;  Surgeon: Olivia Mackie, MD;  [facility]: [facility];  Service: Gynecology;  Laterality: Bilateral;   tear duct surgery     at 23 months old - right eye   TONSILLECTOMY AND ADENOIDECTOMY  2000   WISDOM TOOTH EXTRACTION      Current Outpatient Medications  Medication Sig Dispense Refill    cyclobenzaprine (FLEXERIL) 10 MG tablet Take 10 mg by mouth every evening.      MAGNESIUM PO Take 6 tablets by mouth daily.     Multiple Vitamin (MULTIVITAMIN WITH MINERALS) TABS tablet Take 1 tablet by mouth every evening.     Naltrexone HCl, Pain, 4.5 MG CAPS Take 4.5 mg by mouth daily.     Omega-3 Fatty Acids (FISH OIL) 1000 MG CAPS Take 3 capsules by mouth daily.     oxyCODONE-acetaminophen (PERCOCET/ROXICET) 5-325 MG tablet Take 1 tablet by mouth every 6 (six) hours as needed for moderate pain (fribromylagia).     polyethylene glycol (MIRALAX / GLYCOLAX) 17 g packet Take 17 g by mouth daily as needed for mild constipation. 14 each 0   Probiotic Product (PROBIOTIC DAILY PO) Take 1 tablet by mouth every evening.      propranolol (INDERAL) 20 MG tablet Take 1 tablet (20 mg total) by mouth 3 (three) times daily. 270 tablet 0   sertraline (ZOLOFT) 50 MG tablet Take 50 mg by mouth daily.     traMADol (ULTRAM) 50 MG tablet Take 1 tablet (50 mg total) by mouth every 6 (six) hours as needed for severe pain. 15 tablet 0   No current facility-administered medications for this visit.    Allergies:   Erythromycin, Latex, Zithromax [azithromycin], Augmentin [amoxicillin-pot clavulanate], Dilaudid [hydromorphone], Nitrofurantoin monohyd macro, and Penicillins   ROS:  Please see the history of present illness.   All other systems are personally reviewed and negative.    Exam:    Vital Signs:  LMP 02/06/2020        Labs/Other Tests and Data Reviewed:    Recent Labs: 09/23/2023: ALT 31; BUN 19; Creatinine, Ser 0.68; Hemoglobin 14.8; Magnesium 2.3; Platelets 291; Potassium 4.0; Sodium 141; TSH 1.866   Wt Readings from Last 3 Encounters:  10/29/23 162 lb 9.6 oz (73.8 kg)  09/24/23 151 lb 8 oz (68.7 kg)  09/04/23 150 lb (68 kg)     Other studies personally reviewed: Additional studies/ records that were reviewed today include:   Review of the above records today demonstrates:  Prior  radiographs:     ASSESSMENT & PLAN:    Dysautonomia   Fibromyalgia   Fatigue   Anxiety/depression  Improved.  Will change her propranolol from 20 3 times daily to long-acting to be taken at night. Continue compression. Reviewed again the importance of exercise and she is going to pursue a recumbent bicycle Will write a letter of accommodation for her work to try to minimize how much running around she has to do to avoid the orthostatic stresses    Follow-up:  cancel SR appt 1/3   F/u SK 8-10 weeks     Current medicines are reviewed at length with the patient today.   The patient does not have concerns regarding her medicines.  The following changes were made today:  change propranolol from 20 tid to LA 80.  Labs/ tests ordered today include:  No orders of the defined types were placed in this encounter.     Today, I have spent 21 minutes with the patient with telehealth technology discussing the above.  Signed, Sherryl Manges, MD  11/09/2023 8:58 AM     [facility] [address] [place] [address] [phone] (office) [phone] (fax)

## 2023-11-09 NOTE — Addendum Note (Signed)
Addended by: Alois Cliche on: 11/09/2023 09:38 AM   Modules accepted: Orders

## 2023-11-09 NOTE — Patient Instructions (Signed)
Medication Instructions:  Your physician has recommended you make the following change in your medication:   ** Stop Propranolol 20mg   ** Begin Propranolol ER 80mg  - 1 tablet by mouth nightly - This has been sent to [facility].      *If you need a refill on your cardiac medications before your next appointment, please call your pharmacy*   Lab Work: None ordered.  If you have labs (blood work) drawn today and your tests are completely normal, you will receive your results only by: MyChart Message (if you have MyChart) OR A paper copy in the mail If you have any lab test that is abnormal or we need to change your treatment, we will call you to review the results.   Testing/Procedures: None ordered.    Follow-Up: At [facility], you and your health needs are our priority.  As part of our continuing mission to provide you with exceptional heart care, we have created designated Provider Care Teams.  These Care Teams include your primary Cardiologist (physician) and Advanced Practice Providers (APPs -  Physician Assistants and Nurse Practitioners) who all work together to provide you with the care you need, when you need it.  We recommend signing up for the patient portal called "MyChart".  Sign up information is provided on this After Visit Summary.  MyChart is used to connect with patients for Virtual Visits (Telemedicine).  Patients are able to view lab/test results, encounter notes, upcoming appointments, etc.  Non-urgent messages can be sent to your provider as well.   To learn more about what you can do with MyChart, go to [email or web address].    Your next appointment:   10 weeks with Dr Graciela Husbands - I will have his scheduler contact you regarding this appointment.

## 2023-11-10 NOTE — Telephone Encounter (Signed)
[name] - 11/06/2023  2:04 PM Sherie Don, NP  Sent: Mon November 09, 2023  9:59 AM  To: Jefferey Pica, RN         Message  Can you draft this letter for me? RTW with no restrictions.      Thank you,  Luella Cook

## 2023-11-10 NOTE — Telephone Encounter (Signed)
Reviewed updated MyChart messages and the patient is still needing a separate letter with a RTW date on it.   Will forward to [place] to review.  Suzann, do you just want the letter to state-   Kim Kennedy ([birth year]) by return to work without restrictions on 11/16/23.   Dr. Graciela Husbands has done her a separate "accommodations" letter so I wasn't sure exactly how the letter needed to be stated "without restrictions" or "with accommodations as recommended by Dr. Graciela Husbands."

## 2023-11-10 NOTE — Telephone Encounter (Signed)
My Chart message sent to the patient to clarify if she still needs a letter from [place] stating a specific RTW date/ no restrictions.   Dr. Graciela Husbands drafted an additional letter for the patient yesterday.   Will await the patient's response.

## 2023-11-11 NOTE — Telephone Encounter (Signed)
Left a message for the patient to call back.  

## 2023-11-12 ENCOUNTER — Encounter: Payer: Self-pay | Admitting: Cardiology

## 2023-11-12 NOTE — Telephone Encounter (Signed)
Pt called and reports that she would like the letter to say return to work 11/16/2023 "with accommodations as recommended by Dr. Graciela Husbands."  that are previously noted in the letter from Dr Graciela Husbands  Pt requests letter to be sent to her MyChart and she will send to [facility]  This message forwarded to [facility]

## 2023-11-12 NOTE — Telephone Encounter (Signed)
Patient returning call.

## 2023-11-27 ENCOUNTER — Ambulatory Visit: Payer: 59 | Admitting: Cardiology

## 2023-12-07 ENCOUNTER — Ambulatory Visit: Payer: 59 | Admitting: Cardiology

## 2024-01-18 ENCOUNTER — Ambulatory Visit: Payer: Self-pay | Admitting: Internal Medicine

## 2024-01-23 ENCOUNTER — Other Ambulatory Visit: Payer: Self-pay | Admitting: Internal Medicine

## 2024-01-23 DIAGNOSIS — R002 Palpitations: Secondary | ICD-10-CM

## 2024-01-30 DIAGNOSIS — R002 Palpitations: Secondary | ICD-10-CM

## 2024-02-10 ENCOUNTER — Ambulatory Visit: Payer: Self-pay | Attending: Internal Medicine | Admitting: Internal Medicine

## 2024-02-10 ENCOUNTER — Encounter: Payer: Self-pay | Admitting: Internal Medicine

## 2024-02-10 VITALS — BP 109/78 | HR 102 | Ht 64.0 in | Wt 169.4 lb

## 2024-02-10 DIAGNOSIS — G901 Familial dysautonomia [Riley-Day]: Secondary | ICD-10-CM | POA: Diagnosis not present

## 2024-02-10 MED ORDER — PROPRANOLOL HCL ER 80 MG PO CP24: 80.0000 mg | ORAL_CAPSULE | Freq: Every day | ORAL | 3 refills | Status: DC

## 2024-02-10 NOTE — Progress Notes (Signed)
 Patient Care Team: Soundra Pilon, FNP as PCP - General (Family Medicine) Marinus Maw, MD as PCP - Electrophysiology (Cardiology)   HPI  Kim Kennedy is a 43 y.o. female seeen in followup of palpitations in context of dysautonomia fibromyalgia fatigue with anxiety and depression   She is considerably better on the propranolol although she feels like its benefits wear out about the mid afternoon.  Prior to returning to work she had become intentionally engaging with her recumbent bike to her surprising benefit. Her husband was able to get a new job.  Her return to work was much less stressful and she had feared.   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   IBS (irritable bowel syndrome)    diet controlled   Inactive TB 2011   chest xray neg   LGSIL of cervix of undetermined significance    Nausea    patient denies   Osteoarthritis    Palpitations 08/30/2014   Pneumonia    Symptomatic cholelithiasis 07/02/2020   Tachycardia    controlled with lopressor, no problems   Third trimester pregnancy at less than 36 weeks 11/22/2014    Past Surgical History:  Procedure Laterality Date   ABDOMINAL HYSTERECTOMY     CESAREAN  SECTION N/A 12/26/2014   Procedure: Primary CESAREAN SECTION;  Surgeon: Lenoard Aden, MD;  Location: [facility];  Service: Obstetrics;  Laterality: N/A;  EDD: 12/23/14   CHOLECYSTECTOMY N/A 07/03/2020   Procedure: LAPAROSCOPIC CHOLECYSTECTOMY WITH LIVER BIOPSY;  Surgeon: Romie Levee, MD;  Location: [facility];  Service: General;  Laterality: N/A;   COLPOSCOPY W/ BIOPSY / CURETTAGE     DILATION AND EVACUATION N/A 12/26/2014   Procedure: DILATATION AND EVACUATION with Bakri Balloon Placement;  Surgeon: Lenoard Aden, MD;  Location: [facility];  Service: Gynecology;  Laterality: N/A;   LAPAROSCOPIC LYSIS OF ADHESIONS N/A 02/27/2014   Procedure: LAPAROSCOPIC LYSIS OF ADHESIONS;  Surgeon: Lenoard Aden, MD;  Location: [facility];  Service: Gynecology;  Laterality: N/A;   LAPAROSCOPY N/A 02/27/2014   Procedure: LAPAROSCOPY DIAGNOSTIC;  Surgeon: Lenoard Aden, MD;  Location: [facility];  Service: Gynecology;  Laterality: N/A;   MANDIBLE FRACTURE SURGERY  03/2001   x 7 jaw surgeries. TMJ   NASAL SEPTUM SURGERY  03/2001   ROBOTIC ASSISTED LAPAROSCOPIC HYSTERECTOMY AND SALPINGECTOMY Bilateral 04/19/2020   Procedure: XI ROBOTIC ASSISTED LAPAROSCOPIC HYSTERECTOMY AND SALPINGECTOMY lysis of left adnexa to left sigmoid mesentery adhesions, Kim Kennedy culdoplasty.;  Surgeon: Olivia Mackie, MD;  Location: [facility];  Service: Gynecology;  Laterality: Bilateral;   tear duct surgery     at 37 months old - right eye   TONSILLECTOMY AND ADENOIDECTOMY  2000   WISDOM TOOTH EXTRACTION      Current Meds  Medication Sig   cyclobenzaprine (FLEXERIL) 10 MG tablet Take 10 mg by mouth every evening.    MAGNESIUM PO Take 6 tablets by mouth daily.   Multiple Vitamin (MULTIVITAMIN WITH MINERALS) TABS tablet Take 1 tablet by mouth every evening.   Naltrexone HCl, Pain, 4.5 MG CAPS Take 4.5 mg by mouth daily.   Omega-3 Fatty Acids (FISH OIL) 1000 MG CAPS Take 3 capsules by mouth daily.   oxyCODONE-acetaminophen  (PERCOCET/ROXICET) 5-325 MG tablet Take 1 tablet by mouth every 6 (six) hours as needed for moderate pain (fribromylagia).   polyethylene glycol (MIRALAX / GLYCOLAX) 17 g packet Take 17 g by mouth daily as needed for mild constipation.   Probiotic Product (PROBIOTIC DAILY PO) Take 1 tablet by mouth every evening.    sertraline (ZOLOFT) 50 MG tablet Take 50 mg by mouth daily.   traMADol (ULTRAM) 50 MG tablet Take 1 tablet (50 mg total) by mouth every 6 (six) hours as needed for severe pain.   [DISCONTINUED] propranolol ER (INDERAL LA) 80 MG 24 hr capsule Take 1 capsule (80 mg total) by mouth at bedtime.    Allergies  Allergen Reactions   Erythromycin Anaphylaxis   Latex Swelling and Rash    Swelling at contact site   Zithromax [Azithromycin] Anaphylaxis and Other (See Comments)    Tightness in chest   Augmentin [Amoxicillin-Pot Clavulanate] Diarrhea and Nausea And Vomiting   Dilaudid [Hydromorphone]    Nitrofurantoin Monohyd Macro Hives   Penicillins Diarrhea and Nausea And Vomiting      Review of Systems negative except from HPI and PMH  Physical Exam BP 109/78 Comment: standing @ 3 mins  Pulse (!) 102   Ht 5\' 4"  (1.626 m)   Wt 169 lb 6.4 oz (76.8 kg)   LMP 02/06/2020   SpO2 97%   BMI 29.08 kg/m  Well developed and well nourished in no acute distress HENT normal E scleral and icterus clear Neck Supple JVP flat Clear to ausculation Regular rate and rhythm, no murmurs gallops or rub Soft  No clubbing cyanosis  Edema Alert and oriented, grossly normal motor and sensory function Skin Warm and Dry  ECG sinus @ 95  15/07/35  CrCl cannot be calculated (Patient's most recent lab result is older than the maximum 21 days allowed.).   Assessment and  Plan Palpitations  Dysautonomia  Fibromyalgia   Overall much improved.  Will continue her propranolol 80 LA at this juncture we will have her take it in the morning in the hopes that it last through the day, if not we can  increase it to 120 if her blood pressure will tolerate.  Encouraged her to resume her exercise on the recumbent bike.          Current medicines are reviewed at length with the patient today .  The patient does not  have concerns regarding medicines.

## 2024-02-10 NOTE — Patient Instructions (Addendum)
 Medication Instructions:  Your physician recommends that you continue on your current medications as directed. Please refer to the Current Medication list given to you today.  *If you need a refill on your cardiac medications before your next appointment, please call your pharmacy*   Lab Work: None ordered.  If you have labs (blood work) drawn today and your tests are completely normal, you will receive your results only by: MyChart Message (if you have MyChart) OR A paper copy in the mail If you have any lab test that is abnormal or we need to change your treatment, we will call you to review the results.   Testing/Procedures: None ordered.    Follow-Up: At [facility], you and your health needs are our priority.  As part of our continuing mission to provide you with exceptional heart care, we have created designated Provider Care Teams.  These Care Teams include your primary Cardiologist (physician) and Advanced Practice Providers (APPs -  Physician Assistants and Nurse Practitioners) who all work together to provide you with the care you need, when you need it.  We recommend signing up for the patient portal called "MyChart".  Sign up information is provided on this After Visit Summary.  MyChart is used to connect with patients for Virtual Visits (Telemedicine).  Patients are able to view lab/test results, encounter notes, upcoming appointments, etc.  Non-urgent messages can be sent to your provider as well.   To learn more about what you can do with MyChart, go to [email or web address].    Your next appointment:   3 month telephone visit with Dr Graciela Husbands

## 2024-03-01 ENCOUNTER — Telehealth: Payer: Self-pay | Admitting: Internal Medicine

## 2024-03-01 NOTE — Telephone Encounter (Signed)
 Spoke with pt and advised Dr Graciela Husbands has been out of the office.  Pt reports BP has been stable with systolic 107-115's/70's.  She would like to try increasing Propranolol ER to 120mg .  She leaves for [place] this Thursday and will need Propranolol refill regardless of dose.  Will forward to Dr Graciela Husbands for further recommendation.  Pt verbalizes understanding and thanked Charity fundraiser for the callback.

## 2024-03-01 NOTE — Telephone Encounter (Signed)
 Pt c/o medication issue:  1. Name of Medication:   propranolol ER (INDERAL LA) 80 MG 24 hr capsule   2. How are you currently taking this medication (dosage and times per day)?  Take 1 capsule (80 mg total) by mouth at bedtime.      3. Are you having a reaction (difficulty breathing--STAT)? No  4. What is your medication issue? Pt is requesting a callback from nurse Mindi Junker regarding her wanting to know if her dosage can be increased to 120mg  before she leaves to go out of town on Thursday. She'd like a 30 day supply to try it and if it works she'd like to be on it long term. Please advise.

## 2024-03-03 MED ORDER — PROPRANOLOL HCL ER 120 MG PO CP24: 120.0000 mg | ORAL_CAPSULE | Freq: Every day | ORAL | 1 refills | Status: DC

## 2024-05-06 ENCOUNTER — Telehealth: Admitting: Internal Medicine

## 2024-05-06 DIAGNOSIS — M797 Fibromyalgia: Secondary | ICD-10-CM | POA: Diagnosis not present

## 2024-05-06 DIAGNOSIS — F3341 Major depressive disorder, recurrent, in partial remission: Secondary | ICD-10-CM | POA: Diagnosis not present

## 2024-05-17 DIAGNOSIS — K625 Hemorrhage of anus and rectum: Secondary | ICD-10-CM | POA: Diagnosis not present

## 2024-05-17 DIAGNOSIS — Z800 Family history of malignant neoplasm of digestive organs: Secondary | ICD-10-CM | POA: Diagnosis not present

## 2024-05-17 DIAGNOSIS — K6289 Other specified diseases of anus and rectum: Secondary | ICD-10-CM | POA: Diagnosis not present

## 2024-05-17 DIAGNOSIS — K601 Chronic anal fissure: Secondary | ICD-10-CM | POA: Diagnosis not present

## 2024-05-23 DIAGNOSIS — R350 Frequency of micturition: Secondary | ICD-10-CM | POA: Diagnosis not present

## 2024-05-23 DIAGNOSIS — Z7189 Other specified counseling: Secondary | ICD-10-CM | POA: Diagnosis not present

## 2024-05-23 DIAGNOSIS — Z1231 Encounter for screening mammogram for malignant neoplasm of breast: Secondary | ICD-10-CM | POA: Diagnosis not present

## 2024-05-23 DIAGNOSIS — Z01411 Encounter for gynecological examination (general) (routine) with abnormal findings: Secondary | ICD-10-CM | POA: Diagnosis not present

## 2024-05-30 ENCOUNTER — Other Ambulatory Visit: Payer: Self-pay

## 2024-05-30 MED ORDER — PROPRANOLOL HCL ER 120 MG PO CP24: 120.0000 mg | ORAL_CAPSULE | Freq: Every day | ORAL | 2 refills | Status: DC

## 2024-06-01 DIAGNOSIS — L308 Other specified dermatitis: Secondary | ICD-10-CM | POA: Diagnosis not present

## 2024-06-01 DIAGNOSIS — L821 Other seborrheic keratosis: Secondary | ICD-10-CM | POA: Diagnosis not present

## 2024-06-01 DIAGNOSIS — D485 Neoplasm of uncertain behavior of skin: Secondary | ICD-10-CM | POA: Diagnosis not present

## 2024-06-01 DIAGNOSIS — L309 Dermatitis, unspecified: Secondary | ICD-10-CM | POA: Diagnosis not present

## 2024-07-06 DIAGNOSIS — R0989 Other specified symptoms and signs involving the circulatory and respiratory systems: Secondary | ICD-10-CM | POA: Diagnosis not present

## 2024-07-06 DIAGNOSIS — M797 Fibromyalgia: Secondary | ICD-10-CM | POA: Diagnosis not present

## 2024-07-06 DIAGNOSIS — F419 Anxiety disorder, unspecified: Secondary | ICD-10-CM | POA: Diagnosis not present

## 2024-07-06 DIAGNOSIS — F3341 Major depressive disorder, recurrent, in partial remission: Secondary | ICD-10-CM | POA: Diagnosis not present

## 2024-07-12 ENCOUNTER — Encounter: Payer: Self-pay | Admitting: Cardiology

## 2024-07-12 ENCOUNTER — Ambulatory Visit: Attending: Cardiology | Admitting: Cardiology

## 2024-07-12 VITALS — BP 111/77 | HR 87 | Ht 64.0 in | Wt 160.0 lb

## 2024-07-12 DIAGNOSIS — R000 Tachycardia, unspecified: Secondary | ICD-10-CM

## 2024-07-12 DIAGNOSIS — G901 Familial dysautonomia [Riley-Day]: Secondary | ICD-10-CM | POA: Diagnosis not present

## 2024-07-12 DIAGNOSIS — R0609 Other forms of dyspnea: Secondary | ICD-10-CM

## 2024-07-12 DIAGNOSIS — R002 Palpitations: Secondary | ICD-10-CM

## 2024-07-12 NOTE — Patient Instructions (Signed)
 Medication Instructions:  Your physician recommends that you continue on your current medications as directed. Please refer to the Current Medication list given to you today.  *If you need a refill on your cardiac medications before your next appointment, please call your pharmacy*  Testing/Procedures: Echocardiogram Your physician has requested that you have an echocardiogram. Echocardiography is a painless test that uses sound waves to create images of your heart. It provides your doctor with information about the size and shape of your heart and how well your heart's chambers and valves are working. This procedure takes approximately one hour. There are no restrictions for this procedure. Please do NOT wear cologne, perfume, aftershave, or lotions (deodorant is allowed). Please arrive 15 minutes prior to your appointment time.  Follow-Up: At [facility], you and your health needs are our priority.  As part of our continuing mission to provide you with exceptional heart care, our providers are all part of one team.  This team includes your primary Cardiologist (physician) and Advanced Practice Providers or APPs (Physician Assistants and Nurse Practitioners) who all work together to provide you with the care you need, when you need it.  Your next appointment:   3 months  Provider:   You will see one of the following Advanced Practice Providers on your designated Care Team:   Charlies Arthur, PA-C Michael Andy Tillery, PA-C Suzann Riddle, NP Daphne Barrack, NP

## 2024-07-12 NOTE — Progress Notes (Signed)
 " Electrophysiology Office Note:   Date:  07/13/2024  ID:  Kim Kennedy, DOB 1981/10/22, MRN [id number]  Primary Cardiologist: None Electrophysiologist: Fonda Kitty, MD      History of Present Illness:   Kim Kennedy is a 43 y.o. female with h/o POTS, fatigue, anxiety, depression who is being seen today for follow up evaluation.  Discussed the use of AI scribe software for clinical note transcription with the patient, who gave verbal consent to proceed.  History of Present Illness Kim Kennedy is a 43 year old female who presents with shortness of breath and stress management issues. She experiences significant stress and persistent shortness of breath, feeling as if she has been running even when at rest. 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  She has been provided with a handicap parking sticker and has previously been on short-term disability with accommodations to avoid standing for long periods, extreme temperatures, and to work from home when possible. She needs to renew her ADA paperwork for the current school year due to her ongoing symptoms.  She experiences chest pressure and tightness, described as a 'burning, stinging' sensation. Although she has propranolol  available from previous blood pressure management, her current heart rate is not elevated.  She has not had an ultrasound of her heart, although it was discussed with her former doctor. She is awaiting further evaluation to determine the cause of her symptoms.   Review of systems complete and found to be negative unless listed in HPI.   EP Information / Studies Reviewed:    EKG is not ordered today. EKG from 02/10/2024 reviewed which showed normal sinus rhythm.      Zio 10/2023: Duration: 13.5   Findings HR  avg 90  Min 57-Max 146  PVCs Rare,  less than 1%   PACs  Rare, less than 1%       Symptoms:             SOB and palpitations>> Normal rhtyhm             Chest pain and irregular beats>> Normal rhythm             Lightheadedness and seeing stars>> NSR             Racing >>>NSR     Triggered: sinus at 85-115                 Conclusions: Daytime HR averages somethimes greater than 100 bpm     Recommendations No significant arrhythmias noted  Triggered and symptom events are consistent with the prior clincial diagnosis of dysautomia   Physical Exam:   VS:  BP 111/77   Pulse 87   Ht 5' 4 (1.626 m)   Wt 160 lb (72.6 kg)   LMP 02/06/2020   SpO2 96%   BMI 27.46 kg/m    Wt Readings from Last 3 Encounters:  07/12/24 160 lb (72.6 kg)  02/10/24 169 lb 6.4 oz (76.8 kg)  10/29/23 162 lb 9.6 oz (73.8 kg)     GEN: Well nourished, well developed in no acute distress NECK: No JVD CARDIAC: Normal rate, regular rhythm.  No murmurs rubs or gallops. RESPIRATORY:  Clear to auscultation without rales, wheezing or rhonchi  ABDOMEN: Soft, non-distended EXTREMITIES:  No edema; No deformity   ASSESSMENT AND PLAN:    #POTS /  Dysautonomia: Previously established with my colleague Dr. Fernande who made diagnosis.  She denies significant palpitations.  Symptoms appear to be well-controlled on propranolol .  Her primary complaints are dyspnea on exertion and a lot of fatigue. - Continue propranolol  120 mg daily. - Reviewed usual lifestyle modification recommendations for POTS/dysautonomia, of which she is well educated. - Will order echocardiogram to evaluate for structural heart disease.  # Chronic dyspnea on exertion: I do not believe this is related to her heart rates, as they have been well-controlled since starting propranolol .  No documentation of arrhythmias.  I suspect that is a noncardiac cause, but she has not had formal echocardiogram. - She has been a pending pulmonology appointment, after referral was made by her PCP. -  Obtain echocardiogram to assess for structural heart disease, no known issues.   Follow up with EP APPin 3 months.  If no further EP needs and symptoms are well-controlled on propranolol , then can transition follow-up to PCP.  Signed, Fonda Kitty, MD  "

## 2024-07-19 ENCOUNTER — Encounter: Payer: Self-pay | Admitting: Pulmonary Disease

## 2024-07-19 ENCOUNTER — Ambulatory Visit: Admitting: Pulmonary Disease

## 2024-07-19 VITALS — BP 104/76 | HR 82 | Temp 98.1°F | Ht 64.0 in | Wt 164.8 lb

## 2024-07-19 DIAGNOSIS — J45909 Unspecified asthma, uncomplicated: Secondary | ICD-10-CM

## 2024-07-19 DIAGNOSIS — R0602 Shortness of breath: Secondary | ICD-10-CM | POA: Diagnosis not present

## 2024-07-19 DIAGNOSIS — I4711 Inappropriate sinus tachycardia, so stated: Secondary | ICD-10-CM | POA: Diagnosis not present

## 2024-07-19 DIAGNOSIS — G901 Familial dysautonomia [Riley-Day]: Secondary | ICD-10-CM

## 2024-07-19 LAB — NITRIC OXIDE: Nitric Oxide: 16

## 2024-07-19 MED ORDER — SPIRIVA RESPIMAT 2.5 MCG/ACT IN AERS: 2.0000 | INHALATION_SPRAY | Freq: Every day | RESPIRATORY_TRACT | Status: DC

## 2024-07-19 NOTE — Patient Instructions (Signed)
 VISIT SUMMARY:  Today, you were seen for worsening shortness of breath. You have a history of inappropriate sinus tachycardia and asthma, and you were referred by your electrophysiologist for further evaluation. Your symptoms have been getting worse since February 2025, and you describe a sensation of 'cobwebby' lungs and feeling like the air is 'never enough.'  YOUR PLAN:  -SHORTNESS OF BREATH: Your shortness of breath has been getting worse since February, even though your oxygen levels are normal. This could be due to asthma or the effects of your medication, propranolol . We will conduct breathing tests to understand the cause better. You will start using Spiriva  daily to help reduce your reliance on albuterol . We have provided you with samples of Spiriva  to try before you purchase it.  -INAPPROPRIATE SINUS TACHYCARDIA: Inappropriate sinus tachycardia is a condition where your heart beats faster than normal without a clear reason. You have been managing this with propranolol  since November, which helps control your heart rate without lowering your blood pressure too much. However, propranolol  might be causing some of your breathing issues.  -HISTORY OF ASTHMA: You have a history of asthma and frequent bronchitis. Your current symptoms do not suggest type two allergic asthma. We have prescribed Spiriva , a long-acting bronchodilator, to help manage your asthma symptoms.  INSTRUCTIONS:  We will conduct breathing tests to better understand the cause of your shortness of breath. Please start using Spiriva  daily and let us  know how you feel with the samples provided before purchasing more.

## 2024-07-19 NOTE — Progress Notes (Signed)
 Subjective:    Patient ID: Kim Kennedy, female    DOB: [birth year], 43 y.o.   MRN: [id number]  Patient Care Team: Marvene Prentice SAUNDERS, FNP as PCP - General (Family Medicine) Kennyth Chew, MD as PCP - Electrophysiology (Cardiology)  Chief Complaint  Patient presents with   Shortness of Breath    Worsening shortness of breath in the last 6 months. Not using any inhalers.     BACKGROUND: Patient is a 43 year old lifelong never smoker who presents for evaluation of worsening dyspnea over the past 6 months.  She is kindly referred by Dr. Chew Kennyth, her primary provider is Prentice Marvene, FNP.   HPI Discussed the use of AI scribe software for clinical note transcription with the patient, who gave verbal consent to proceed.  History of Present Illness   Kim Kennedy is a 43 year old female with inappropriate sinus tachycardia who presents with worsening shortness of breath. She was referred by her electrophysiologist, Dr. Kennyth, for evaluation of her respiratory symptoms.  She has been experiencing worsening shortness of breath since February 2025. Despite normal oxygen levels, she describes a sensation of 'cobwebby' lungs and feeling like the air is 'never enough.' Her history includes exercise intolerance and inappropriate sinus tachycardia (IST) for over 20 years, managed by various electrophysiologists, including Dr. Garnette Sage, who is now retired.  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 She switched from metoprolol  to propranolol  in November 2024, which helps manage her heart rate without significantly lowering her blood pressure.  Her past medical history includes asthma, with frequent bronchitis and sinus infections in childhood. She has used albuterol  inhalers in the past but currently avoids them due to their effect on her heart rate. She recalls  using an inhaler whenever she had bronchitis.  She has not had a menstrual period in a while and does not associate her breathing issues with her menstrual cycle. She has been granted a handicap sticker due to difficulty walking long distances and is in the process of obtaining work accommodations.     She does not have any significant occupational exposure.  Review of Systems A 10 point review of systems was performed and it is as noted above otherwise negative.   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   Cramping affecting pregnancy, antepartum    Depression    hx - no meds    Dysmenorrhea 04/19/2020   Dysrhythmia    Hx tachycardia - controlled with lopressor , no problems   Fibromyalgia 2007   no meds   GBS (group B streptococcus) UTI complicating pregnancy    History of asthma    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   IBS (irritable bowel syndrome)    diet controlled   Inactive TB 2011   chest xray neg   LGSIL of cervix of undetermined significance    Nausea    patient denies   Osteoarthritis    Palpitations 08/30/2014   Pneumonia    Symptomatic cholelithiasis 07/02/2020   Tachycardia    controlled with lopressor , no problems   Third  trimester pregnancy at less than 36 weeks 11/22/2014    Past Surgical History:  Procedure Laterality Date   ABDOMINAL HYSTERECTOMY     CESAREAN SECTION N/A 12/26/2014   Procedure: Primary CESAREAN SECTION;  Surgeon: Charlie JINNY Flowers, MD;  Location: [facility];  Service: Obstetrics;  Laterality: N/A;  EDD: 12/23/14   CHOLECYSTECTOMY N/A 07/03/2020   Procedure: LAPAROSCOPIC CHOLECYSTECTOMY WITH LIVER BIOPSY;  Surgeon: Debby Hila, MD;  Location: [facility];  Service: General;  Laterality: N/A;   COLPOSCOPY W/ BIOPSY /  CURETTAGE     DILATION AND EVACUATION N/A 12/26/2014   Procedure: DILATATION AND EVACUATION with Bakri Balloon Placement;  Surgeon: Charlie JINNY Flowers, MD;  Location: [facility];  Service: Gynecology;  Laterality: N/A;   LAPAROSCOPIC LYSIS OF ADHESIONS N/A 02/27/2014   Procedure: LAPAROSCOPIC LYSIS OF ADHESIONS;  Surgeon: Charlie JINNY Flowers, MD;  Location: [facility];  Service: Gynecology;  Laterality: N/A;   LAPAROSCOPY N/A 02/27/2014   Procedure: LAPAROSCOPY DIAGNOSTIC;  Surgeon: Charlie JINNY Flowers, MD;  Location: [facility];  Service: Gynecology;  Laterality: N/A;   MANDIBLE FRACTURE SURGERY  03/2001   x 7 jaw surgeries. 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   Anxiety    Asthma    Chest tightness    Cramping affecting pregnancy, antepartum    Depression    Dysrhythmia    GBS (group B streptococcus) UTI complicating pregnancy    History of asthma    IBS (irritable bowel syndrome)    LGSIL of cervix of undetermined significance    Nausea    Osteoarthritis    Pneumonia    Tachycardia    Symptomatic cholelithiasis 07/02/2020   Dysmenorrhea 04/19/2020   Cesarean delivery delivered 12/26/2014   Breech presentation 11/22/2014   Third trimester pregnancy at less than 36 weeks 11/22/2014   Palpitations 08/30/2014   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   Inactive TB 2011   Fibromyalgia 2007    Family History  Problem Relation Age of Onset   Fibromyalgia  Mother    Thrombocytopenia Mother        had spleen removed   Hepatitis C Mother    Osteoporosis Mother    Anxiety disorder Mother    Hyperlipidemia Father    Narcolepsy Father    Heart attack Maternal Grandmother    Heart attack Maternal Grandfather    Heart attack Paternal Grandmother    Osteoporosis Paternal Grandmother    Heart attack Paternal Grandfather    Diabetes Paternal Uncle        DM    Social History   Tobacco Use   Smoking status: Never   Smokeless tobacco: Never  Substance Use Topics   Alcohol use: Yes    Comment: 1 glass of wine/week but none with preganacy    Allergies  Allergen Reactions   Erythromycin Anaphylaxis   Latex Swelling and Rash    Swelling at contact site   Zithromax [Azithromycin]  Anaphylaxis and Other (See Comments)    Tightness in chest   Augmentin [Amoxicillin-Pot Clavulanate] Diarrhea and Nausea And Vomiting   Dilaudid  [Hydromorphone ]    Nitrofurantoin Monohyd Macro Hives   Penicillins Diarrhea and Nausea And Vomiting    Current Meds  Medication Sig   cyclobenzaprine  (FLEXERIL ) 10 MG tablet Take 10 mg by mouth every evening.    hydrOXYzine (ATARAX) 10 MG tablet Take 10 mg by mouth 3 (three) times daily as needed.   MAGNESIUM  PO Take 6 tablets by mouth daily.   Multiple Vitamin (MULTIVITAMIN WITH MINERALS) TABS tablet Take 1 tablet by mouth every evening.   Naltrexone HCl, Pain, 4.5 MG CAPS Take 4.5 mg by mouth daily.   Omega-3 Fatty Acids (FISH OIL) 1000 MG CAPS Take 3 capsules by mouth daily.   oxyCODONE -acetaminophen  (PERCOCET/ROXICET) 5-325 MG tablet Take 1 tablet by mouth every 6 (six) hours as needed for moderate pain (fribromylagia).   Probiotic Product (PROBIOTIC DAILY PO) Take 1 tablet by mouth every evening.    propranolol  ER (INDERAL  LA) 120 MG 24 hr capsule Take 1 capsule (120 mg total) by mouth daily.   sertraline  (ZOLOFT ) 100 MG tablet Take 100 mg by mouth daily.   Tiotropium Bromide Monohydrate  (SPIRIVA  RESPIMAT) 2.5  MCG/ACT AERS Inhale 2 puffs into the lungs daily.   traMADol  (ULTRAM ) 50 MG tablet Take 1 tablet (50 mg total) by mouth every 6 (six) hours as needed for severe pain.    Immunization History  Administered Date(s) Administered   Tdap 02/09/2013        Objective:     BP 104/76   Pulse 82   Temp 98.1 F (36.7 C) (Oral)   Ht 5' 4 (1.626 m)   Wt 164 lb 12.8 oz (74.8 kg)   LMP 02/06/2020   SpO2 98%   Breastfeeding Unknown   BMI 28.29 kg/m   SpO2: 98 %  GENERAL: Well-developed, well-nourished woman, no conversational dyspnea. HEAD: Normocephalic, atraumatic.  EYES: Pupils equal, round, reactive to light.  No scleral icterus.  MOUTH: Dentition intact, oral mucosa moist.  No thrush. NECK: Supple. No thyromegaly. Trachea midline. No JVD.  No adenopathy. PULMONARY: Good air entry bilaterally.  No adventitious sounds. CARDIOVASCULAR: S1 and S2. Regular rate and rhythm.  No rubs, murmurs or gallops heard. ABDOMEN: Benign. MUSCULOSKELETAL: No joint deformity, no clubbing, no edema.  NEUROLOGIC: No overt focal deficit, no gait disturbance, speech is fluent. SKIN: Intact,warm,dry. PSYCH: Mood and behavior normal.  Ambulatory oxymetry was performed today:  At rest on room air oxygen saturation was 100%, the patient ambulated at a fast pace, completed 3 laps, O2 nadir 100%, mild shortness of breath.  Resting heart rate was 82 bpm at maximum for this exercise 106 bpm. There is no desaturation during exercise.  Heart rate response to exercise it appeared appropriate.    Lab Results  Component Value Date   NITRICOXIDE 16 07/19/2024  *No evidence of significant type II inflammation.   Assessment & Plan:     ICD-10-CM   1. Shortness of breath  R06.02 Nitric oxide     Pulmonary function test    2. Dysautonomia (HCC)  G90.1       Orders Placed This Encounter  Procedures   Nitric oxide    Pulmonary function test    Standing Status:   Future    Expiration Date:   07/19/2025     Where should this test be performed?:   Outpatient Pulmonary    What type of PFT is  being ordered?:   Full PFT    Meds ordered this encounter  Medications   Tiotropium Bromide Monohydrate  (SPIRIVA  RESPIMAT) 2.5 MCG/ACT AERS    Sig: Inhale 2 puffs into the lungs daily.    Dispense:  2 each    Lot Number?:   [id number]    Expiration Date?:   10/23/2025    NDC:   0597-0100-28 [[id number]]    Quantity:   2   Discussion:    Shortness of breath Shortness of breath has been escalating since February with normal oxygen levels. Differential diagnosis includes asthma and propranolol  side effect given propranolol  is not cardioselective and can affect beta-2 receptors. Low airway inflammation suggests type two allergic asthma is unlikely. Symptoms may be related to propranolol -induced B2 receptor effect. - Order pulmonary function tests - Prescribe Spiriva  for daily use to reduce reliance on albuterol  - Provide Spiriva  samples for trial before purchase - If we are dealing with propranolol  side effect a SAMA/LAMA will work better in this regard  Inappropriate sinus tachycardia Long-standing inappropriate sinus tachycardia managed with propranolol  since November. Metoprolol  was previously effective, but propranolol  was chosen to manage heart rate without significantly lowering blood pressure. Propranolol 's nonselective nature may contribute to shortness of breath and fatigue.  History of asthma Asthma with frequent bronchitis and use of albuterol  inhaler. Current symptoms not consistent with type two allergic asthma. - Prescribe Spiriva  as a long-acting bronchodilator - Continue albuterol  as needed    Advised if symptoms do not improve or worsen, to please contact office for sooner follow up or seek emergency care.    I spent 60 minutes of dedicated to the care of this patient on the date of this encounter to include pre-visit review of records, face-to-face time with the patient discussing conditions  above, post visit ordering of testing, clinical documentation with the electronic health record, making appropriate referrals as documented, and communicating necessary findings to members of the patients care team.   C. Leita Sanders, MD Advanced Bronchoscopy PCCM [facility]    *This note was dictated using voice recognition software/Dragon.  Despite best efforts to proofread, errors can occur which can change the meaning. Any transcriptional errors that result from this process are unintentional and may not be fully corrected at the time of dictation.

## 2024-07-26 ENCOUNTER — Ambulatory Visit: Admitting: Cardiology

## 2024-08-04 ENCOUNTER — Encounter: Payer: Self-pay | Admitting: Pulmonary Disease

## 2024-08-17 ENCOUNTER — Ambulatory Visit (HOSPITAL_COMMUNITY)
Admission: RE | Admit: 2024-08-17 | Discharge: 2024-08-17 | Disposition: A | Discharge status: Home / Self Care | Source: Ambulatory Visit | Attending: Cardiovascular Disease | Admitting: Cardiovascular Disease

## 2024-08-17 DIAGNOSIS — R0609 Other forms of dyspnea: Secondary | ICD-10-CM | POA: Diagnosis not present

## 2024-08-17 DIAGNOSIS — R000 Tachycardia, unspecified: Secondary | ICD-10-CM | POA: Insufficient documentation

## 2024-08-17 DIAGNOSIS — G901 Familial dysautonomia [Riley-Day]: Secondary | ICD-10-CM | POA: Diagnosis not present

## 2024-08-17 DIAGNOSIS — R002 Palpitations: Secondary | ICD-10-CM | POA: Diagnosis not present

## 2024-08-18 LAB — ECHOCARDIOGRAM COMPLETE: S' Lateral: 2.31 cm

## 2024-08-21 ENCOUNTER — Ambulatory Visit: Payer: Self-pay | Admitting: Cardiology

## 2024-08-30 ENCOUNTER — Ambulatory Visit: Admitting: Pulmonary Disease

## 2024-08-30 DIAGNOSIS — R0602 Shortness of breath: Secondary | ICD-10-CM | POA: Diagnosis not present

## 2024-08-30 LAB — PULMONARY FUNCTION TEST
DL/VA % pred: 122 %
DL/VA: 5.35 ml/min/mmHg/L
DLCO unc % pred: 116 %
DLCO unc: 25.27 ml/min/mmHg
FEF 25-75 Post: 2.8 L/s
FEF 25-75 Pre: 2.73 L/s
FEF2575-%Change-Post: 2 %
FEF2575-%Pred-Post: 92 %
FEF2575-%Pred-Pre: 90 %
FEV1-%Change-Post: 0 %
FEV1-%Pred-Post: 89 %
FEV1-%Pred-Pre: 88 %
FEV1-Post: 2.64 L
FEV1-Pre: 2.62 L
FEV1FVC-%Change-Post: 0 %
FEV1FVC-%Pred-Pre: 99 %
FEV6-%Change-Post: 1 %
FEV6-%Pred-Post: 90 %
FEV6-%Pred-Pre: 89 %
FEV6-Post: 3.24 L
FEV6-Pre: 3.21 L
FEV6FVC-%Pred-Post: 102 %
FEV6FVC-%Pred-Pre: 102 %
FVC-%Change-Post: 1 %
FVC-%Pred-Post: 88 %
FVC-%Pred-Pre: 87 %
FVC-Post: 3.25 L
FVC-Pre: 3.22 L
Post FEV1/FVC ratio: 81 %
Post FEV6/FVC ratio: 100 %
Pre FEV1/FVC ratio: 82 %
Pre FEV6/FVC Ratio: 100 %
RV % pred: 134 %
RV: 2.21 L
TLC % pred: 106 %
TLC: 5.39 L

## 2024-08-30 NOTE — Progress Notes (Signed)
 Full PFT completed today ? ?

## 2024-08-30 NOTE — Patient Instructions (Signed)
 Full PFT completed today ? ?

## 2024-09-01 ENCOUNTER — Ambulatory Visit (INDEPENDENT_AMBULATORY_CARE_PROVIDER_SITE_OTHER): Admitting: Pulmonary Disease

## 2024-09-01 ENCOUNTER — Encounter: Payer: Self-pay | Admitting: Pulmonary Disease

## 2024-09-01 VITALS — BP 90/70 | HR 94 | Temp 98.0°F | Ht 64.0 in | Wt 156.6 lb

## 2024-09-01 DIAGNOSIS — Z23 Encounter for immunization: Secondary | ICD-10-CM | POA: Diagnosis not present

## 2024-09-01 DIAGNOSIS — R0602 Shortness of breath: Secondary | ICD-10-CM

## 2024-09-01 DIAGNOSIS — G901 Familial dysautonomia [Riley-Day]: Secondary | ICD-10-CM

## 2024-09-01 NOTE — Patient Instructions (Signed)
 VISIT SUMMARY:  Today, we discussed your ongoing shortness of breath and reviewed your recent echocardiogram results. We also talked about your history of postural orthostatic tachycardia syndrome (POTS) and inappropriate sinus tachycardia (IST), and how to manage these conditions effectively.  YOUR PLAN:  -SHORTNESS OF BREATH: Your lung function is normal, and your echocardiogram showed no significant issues except for expected valve movements related to POTS and IST. Spiriva  was not effective for you, so we will focus on exercise to improve muscle function, which may help alleviate your symptoms. You received an influenza vaccination today. Please return if you experience any respiratory issues.  -POSTURAL ORTHOSTATIC TACHYCARDIA SYNDROME (POTS) AND INAPPROPRIATE SINUS TACHYCARDIA (IST): POTS and IST are conditions that affect your heart rate and blood pressure, causing symptoms like dizziness and rapid heartbeat. Your echocardiogram was normal except for expected valve movements. You should continue to follow up with your electrophysiologist. We discussed the benefits of rehabilitation and adaptation strategies, such as using compression garments and exercising on a recumbent bike in short sessions to manage fatigue.  INSTRUCTIONS:  Please continue to follow up with your electrophysiologist as scheduled. If you experience any new or worsening respiratory issues, return to the clinic for further evaluation.

## 2024-09-01 NOTE — Progress Notes (Unsigned)
 Subjective:    Patient ID: Kim Kennedy, female    DOB: Dec 15, 1980, 43 y.o.   MRN: [id number]  Patient Care Team: Marvene Prentice SAUNDERS, FNP as PCP - General (Family Medicine) Kennyth Chew, MD as PCP - Electrophysiology (Cardiology)  Chief Complaint  Patient presents with   Shortness of Breath    Shortness of breath on exertion  and occasional at rest.     BACKGROUND/INTERVAL:Patient is a 43 year old lifelong never smoker who presents for evaluation of worsening dyspnea in the setting of dysautonomia.  She was initially evaluated on 19 July 2024.  HPI Discussed the use of AI scribe software for clinical note transcription with the patient, who gave verbal consent to proceed.  History of Present Illness   Kim BOOHER is a 43 year old female with dysautonomia who presents with shortness of breath. She was referred by Dr. Kennyth for evaluation.  She experiences persistent shortness of breath despite normal lung function tests. She previously used Spiriva , which provided slight relief but caused her to feel 'off,' leading her to manage the shortness of breath without it.  She has a history of dysautonomia, specifically postural orthostatic tachycardia syndrome (POTS) and inappropriate sinus tachycardia (IST). A recent echocardiogram was normal except for expected valve movements associated with her condition. She is scheduled to see her electrophysiologist in a couple of weeks.  She manages her symptoms while working full-time, noting the challenge of balancing work and exercise. She has a recumbent bike at home and plans to use it in short sessions to help with her condition. She also has a compression garment on order to assist with symptom management.     She had pulmonary function testing performed 30 August 2024 that were entirely normal.  Recall also that ambulatory oximetry performed on initial evaluation was also without any evidence of desaturation during exercise.  Review  of Systems A 10 point review of systems was performed and it is as noted above otherwise negative.   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   Anxiety    Asthma    Chest tightness    Cramping affecting pregnancy, antepartum    Depression    Dysrhythmia    GBS (group B streptococcus) UTI complicating pregnancy    History of asthma    IBS (irritable bowel syndrome)    LGSIL of cervix of undetermined significance    Nausea    Osteoarthritis    Pneumonia    Tachycardia    Symptomatic cholelithiasis 07/02/2020   Dysmenorrhea 04/19/2020   Cesarean delivery delivered 12/26/2014   Breech presentation 11/22/2014   Third trimester pregnancy at less than 36 weeks 11/22/2014   Palpitations 08/30/2014   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   Inactive TB 2011   Fibromyalgia 2007    Social History   Tobacco Use   Smoking status: Never   Smokeless tobacco: Never  Substance Use Topics   Alcohol use: Yes    Comment: 1 glass of wine/week but none with preganacy    Allergies  Allergen Reactions   Erythromycin Anaphylaxis   Latex Swelling and Rash    Swelling at contact site   Zithromax [Azithromycin] Anaphylaxis and Other (See Comments)    Tightness in chest   Augmentin [Amoxicillin-Pot Clavulanate] Diarrhea and Nausea And Vomiting   Dilaudid  [Hydromorphone ]    Nitrofurantoin Monohyd Macro Hives   Penicillins Diarrhea and Nausea And Vomiting    Current Meds  Medication  Sig   albuterol  (PROAIR  HFA) 108 (90 Base) MCG/ACT inhaler Inhale 2 puffs into the lungs every 6 (six) hours as needed for wheezing or shortness of breath.   cyclobenzaprine  (FLEXERIL ) 10 MG tablet Take 10 mg by mouth every evening.    hydrOXYzine (ATARAX) 10 MG tablet Take 10 mg by mouth 3 (three) times daily as needed.   MAGNESIUM  PO Take 6 tablets by mouth daily.   Multiple Vitamin (MULTIVITAMIN WITH MINERALS) TABS tablet Take 1 tablet by mouth every evening.    Naltrexone HCl, Pain, 4.5 MG CAPS Take 4.5 mg by mouth daily.   Omega-3 Fatty Acids (FISH OIL) 1000 MG CAPS Take 3 capsules by mouth daily.   oxyCODONE -acetaminophen  (PERCOCET/ROXICET) 5-325 MG tablet Take 1 tablet by mouth every 6 (six) hours as needed for moderate pain (fribromylagia).   Probiotic Product (PROBIOTIC DAILY PO) Take 1 tablet by mouth every evening.    sertraline  (ZOLOFT ) 100 MG tablet Take 100 mg by mouth daily.   traMADol  (ULTRAM ) 50 MG tablet Take 1 tablet (50 mg total) by mouth every 6 (six) hours as needed for severe pain.   [DISCONTINUED] propranolol  ER (INDERAL  LA) 120 MG 24 hr capsule Take 1 capsule (120 mg total) by mouth daily.    Immunization History  Administered Date(s) Administered   Fluzone Influenza virus vaccine,trivalent (IIV3), split virus 11/24/2012, 09/23/2013, 09/06/2014   Influenza, Seasonal, Injecte, Preservative Fre 09/01/2024   Influenza,inj,Quad PF,6+ Mos 09/22/2016, 09/21/2017, 08/01/2020, 08/26/2021   Influenza,inj,quad, With Preservative 08/31/2015   PFIZER(Purple Top)SARS-COV-2 Vaccination 01/03/2020, 01/30/2020   Pfizer(Comirnaty)Fall Seasonal Vaccine 12 years and older 09/29/2020   Tdap 02/09/2013   Tetanus 11/25/2003        Objective:     BP 90/70   Pulse 94   Temp 98 F (36.7 C) (Temporal)   Ht 5' 4 (1.626 m)   Wt 156 lb 9.6 oz (71 kg)   LMP 02/06/2020   SpO2 97%   BMI 26.88 kg/m   SpO2: 97 %  GENERAL: Well-developed, well-nourished woman, no conversational dyspnea. HEAD: Normocephalic, atraumatic.  EYES: Pupils equal, round, reactive to light.  No scleral icterus.  MOUTH: Dentition intact, oral mucosa moist.  No thrush. NECK: Supple. No thyromegaly. Trachea midline. No JVD.  No adenopathy. PULMONARY: Good air entry bilaterally.  No adventitious sounds. CARDIOVASCULAR: S1 and S2. Regular rate and rhythm.  No rubs, murmurs or gallops heard. ABDOMEN: Benign. MUSCULOSKELETAL: No joint deformity, no clubbing, no edema.   NEUROLOGIC: No overt focal deficit, no gait disturbance, speech is fluent. SKIN: Intact,warm,dry. PSYCH: Mood and behavior normal.   Assessment & Plan:     ICD-10-CM   1. Shortness of breath  R06.02     2. Dysautonomia (HCC)  G90.1     3. Immunization due  Z23 Flu vaccine trivalent PF, 6mos and older(Flulaval,Afluria,Fluarix,Fluzone)      Orders Placed This Encounter  Procedures   Flu vaccine trivalent PF, 6mos and older(Flulaval,Afluria,Fluarix,Fluzone)   Discussion:    Shortness of breath Lung function is normal. Echocardiogram was normal except for expected valve movements associated with POTS and IST. Spiriva  was previously tried but did not significantly improve symptoms and caused an off feeling. Minimal incomplete lung emptying likely due to muscle function rather than asthma. Exercise may improve muscle function and alleviate symptoms. - Administer influenza vaccination - Advise exercise to improve muscle function - Instruct to return if respiratory issues arise  Postural orthostatic tachycardia syndrome (POTS) and inappropriate sinus tachycardia (IST) Echocardiogram normal except for expected  valve movements. Follow-up with electrophysiologist in a couple of weeks. Discussion of potential benefits of rehabilitation and adaptation strategies. Compression garments and recumbent bike exercise are being utilized. Discussed splitting exercise into short sessions to manage fatigue.  Focus on exercises that can be done in a chair or on the floor. - Continue follow-up with electrophysiologist - Consider rehabilitation for POTS - Utilize compression garments - Use recumbent bike for exercise - Split exercise into short sessions   Patient received flu vaccine today.  Will see the patient in follow-up on an as-needed basis.  Advised if symptoms do not improve or worsen, to please contact office for sooner follow up or seek emergency care.    I spent 30  minutes of dedicated  to the care of this patient on the date of this encounter to include pre-visit review of records, face-to-face time with the patient discussing conditions above, post visit ordering of testing, clinical documentation with the electronic health record, making appropriate referrals as documented, and communicating necessary findings to members of the patients care team.     C. Leita Sanders, MD Advanced Bronchoscopy PCCM [facility]    *This note was generated using voice recognition software/Dragon and/or AI transcription program.  Despite best efforts to proofread, errors can occur which can change the meaning. Any transcriptional errors that result from this process are unintentional and may not be fully corrected at the time of dictation.

## 2024-09-02 ENCOUNTER — Encounter: Payer: Self-pay | Admitting: Pulmonary Disease

## 2024-09-02 ENCOUNTER — Other Ambulatory Visit: Payer: Self-pay

## 2024-09-02 MED ORDER — PROPRANOLOL HCL ER 120 MG PO CP24: 120.0000 mg | ORAL_CAPSULE | Freq: Every day | ORAL | 3 refills | Status: DC

## 2024-10-19 ENCOUNTER — Encounter: Payer: Self-pay | Admitting: Cardiology

## 2024-10-19 ENCOUNTER — Ambulatory Visit: Attending: Physician Assistant | Admitting: Cardiology

## 2024-10-19 VITALS — BP 97/67 | HR 102 | Ht 64.0 in | Wt 156.0 lb

## 2024-10-19 DIAGNOSIS — R000 Tachycardia, unspecified: Secondary | ICD-10-CM

## 2024-10-19 DIAGNOSIS — R002 Palpitations: Secondary | ICD-10-CM | POA: Diagnosis not present

## 2024-10-19 DIAGNOSIS — R0789 Other chest pain: Secondary | ICD-10-CM

## 2024-10-19 NOTE — Progress Notes (Signed)
 Electrophysiology Office Note:   Date:  10/19/2024  ID:  Kim Kennedy, DOB 25-May-1981, MRN [id number]  Primary Cardiologist: None Electrophysiologist: Fonda Kitty, MD   Electrophysiologist:  Fonda Kitty, MD      History of Present Illness:   Kim Kennedy is a 43 y.o. female with h/o POTS, chronic fatigue, fibromyalgia, anxiety/depression seen today for routine electrophysiology followup.   Patient with longstanding hx tachycardia. This was originally treated with Metoprolol  and patient had positive response to this until last year when she also began Mounjaro. She developed brain fog, exercise intolerance, fatigue as well as hypotension. These symptoms ultimately led to discontinuation of Metoprolol . Unfortunately when this was stopped, she had recurrence of tachycardia. Ivabradine  was started but patient felt this made symptoms event worse, so Metoprolol  was resumed. She then was switched from Metoprolol  to Propranolol  with good effect. Propranolol  dosing frequency was increased and patient ultimately switched to long acting form.   Patient saw Dr. Kitty in August and was doing well from the standpoint of palpitations/tachycardia. She did report chronic shortness of breath on exertion and fatigue. Echocardiogram ordered this visit showed preserved LVEF and no valvular abnormalities.  Since this visit, patient has also seen pulmonology. They completed PFT, found normal. Per pulmonology, no significant evidence for asthma.   Since last being seen in our clinic the patient reports symptoms are about the same. She still has minimal palpitations on Propranolol , is happy with how this is working. In terms of fatigue and low energy, she feels this has worsened since we last saw her. 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 Admits that work has also been a lot more stressful since her last OV.  We discussed her hydration and salt intake. For the most part, she feels that  she does a good job of regularly taking in both fluids and salt (though today has not been a good day due to running errands). Regarding physical activity and exercise, patient reports that she has very little energy to be able to do this after work and spends most of her weekend recovering for the next week. She does have a recumbent bike but has not been using this much.  She continues to have atypical chest discomfort. This starting about a year ago and is no worse or better. Her discomfort seems to be worse in supine positioning. It is not worsened with physical activity or deep breaths.   Review of systems complete and found to be negative unless listed in HPI.   EP Information / Studies Reviewed:    EKG is ordered today. Personal review as below. ECG today with sinus tachycardia, HR 101. No acute ischemic changes.       Arrhythmia/Device History No specialty comments available.   Physical Exam:   VS:  LMP 02/06/2020    Wt Readings from Last 3 Encounters:  09/01/24 156 lb 9.6 oz (71 kg)  08/30/24 159 lb 12.8 oz (72.5 kg)  07/19/24 164 lb 12.8 oz (74.8 kg)     GEN: No acute distress NECK: No JVD; No carotid bruits CARDIAC: Regular rate and rhythm, no murmurs, rubs, gallops RESPIRATORY:  Clear to auscultation without rales, wheezing or rhonchi  ABDOMEN: Soft, non-tender, non-distended EXTREMITIES:  No edema; No deformity   ASSESSMENT AND PLAN:    Chronic dysautonomia/POTS Patient's palpitations continue to be well managed with long acting Propranolol  120mg . Her main symptoms continue to be fatigue and exertional dyspnea.  Encouraged increased salt intake. I also  encouraged her to pair fluid intake with either meals or snacks to increase absorption.  Discussed importance of even brief exercise. I suspect regular physical activity would improve her fatigue and dyspnea.  Continue Propranolol  120mg  daily. Will defer ongoing management to PCP given longterm stability with this  regimen. Patient can follow up with EP as needed. Patient given information today about dysautonomia clinics at both [facility] and [facility]. I think patient would benefit from their expertise.  Atypical chest discomfort Patient with chronic atypical chest pain, no exertional association. Given reassuring echocardiogram and normal ECGs, concern for cardiac etiology is low. Suspect that this may be related to her fibromyalgia. Encouraged follow up with both PCP and her rheumatologist.       Follow up with EP Team as needed.   Signed, Artist Pouch, PA-C

## 2024-10-19 NOTE — Patient Instructions (Signed)
 Medication Instructions:   Your physician recommends that you continue on your current medications as directed. Please refer to the Current Medication list given to you today.   *If you need a refill on your cardiac medications before your next appointment, please call your pharmacy*   Lab Work:   NONE ORDERED  TODAY    If you have labs (blood work) drawn today and your tests are completely normal, you will receive your results only by: MyChart Message (if you have MyChart) OR A paper copy in the mail If you have any lab test that is abnormal or we need to change your treatment, we will call you to review the results.    Testing/Procedures:  NONE ORDERED  TODAY      Follow-Up: At [facility], you and your health needs are our priority.  As part of our continuing mission to provide you with exceptional heart care, our providers are all part of one team.  This team includes your primary Cardiologist (physician) and Advanced Practice Providers or APPs (Physician Assistants and Nurse Practitioners) who all work together to provide you with the care you need, when you need it.  Your next appointment:  Please contact for POTS clinic  for appointment    Dr. Vivian [facility])  [facility] [phone]   Dorian Room, NP ([place]) [facility] [phone]    CONTACT [facility] 336 [phone] AS NEEDED FOR  ANY CARDIAC RELATED SYMPTOMS    We recommend signing up for the patient portal called MyChart.  Sign up information is provided on this After Visit Summary.  MyChart is used to connect with patients for Virtual Visits (Telemedicine).  Patients are able to view lab/test results, encounter notes, upcoming appointments, etc.  Non-urgent messages can be sent to your provider as well.   To learn more about what you can do with MyChart, go to [email or web address].   Other Instructions

## 2024-11-21 DIAGNOSIS — Z800 Family history of malignant neoplasm of digestive organs: Secondary | ICD-10-CM | POA: Diagnosis not present

## 2024-11-21 DIAGNOSIS — Z1211 Encounter for screening for malignant neoplasm of colon: Secondary | ICD-10-CM | POA: Diagnosis not present

## 2024-11-21 DIAGNOSIS — K635 Polyp of colon: Secondary | ICD-10-CM | POA: Diagnosis not present

## 2024-11-30 ENCOUNTER — Encounter: Payer: Self-pay | Admitting: Cardiology

## 2024-12-01 MED ORDER — PROPRANOLOL HCL ER 120 MG PO CP24: 120.0000 mg | ORAL_CAPSULE | Freq: Every day | ORAL | 3 refills | Status: AC
